# Patient Record
Sex: Female | Born: 1970
Health system: Southern US, Community
[De-identification: ages and names within clinical notes are randomized; demographics above are authoritative.]

## PROBLEM LIST (undated history)

## (undated) DIAGNOSIS — F32A Depression, unspecified: Secondary | ICD-10-CM

## (undated) DIAGNOSIS — F329 Major depressive disorder, single episode, unspecified: Secondary | ICD-10-CM

## (undated) DIAGNOSIS — R0981 Nasal congestion: Secondary | ICD-10-CM

## (undated) DIAGNOSIS — F419 Anxiety disorder, unspecified: Secondary | ICD-10-CM

## (undated) DIAGNOSIS — F319 Bipolar disorder, unspecified: Secondary | ICD-10-CM

## (undated) DIAGNOSIS — F1910 Other psychoactive substance abuse, uncomplicated: Secondary | ICD-10-CM

## (undated) DIAGNOSIS — K5900 Constipation, unspecified: Secondary | ICD-10-CM

## (undated) DIAGNOSIS — K2970 Gastritis, unspecified, without bleeding: Secondary | ICD-10-CM

## (undated) DIAGNOSIS — K279 Peptic ulcer, site unspecified, unspecified as acute or chronic, without hemorrhage or perforation: Secondary | ICD-10-CM

## (undated) DIAGNOSIS — R109 Unspecified abdominal pain: Secondary | ICD-10-CM

## (undated) DIAGNOSIS — E039 Hypothyroidism, unspecified: Secondary | ICD-10-CM

## (undated) DIAGNOSIS — D649 Anemia, unspecified: Secondary | ICD-10-CM

## (undated) DIAGNOSIS — G8929 Other chronic pain: Secondary | ICD-10-CM

## (undated) DIAGNOSIS — R51 Headache: Secondary | ICD-10-CM

## (undated) HISTORY — PX: MULTIPLE TOOTH EXTRACTIONS: SHX2053

## (undated) HISTORY — PX: HERNIA REPAIR: SHX51

## (undated) HISTORY — DX: Bipolar disorder, unspecified: F31.9

## (undated) HISTORY — DX: Nasal congestion: R09.81

## (undated) HISTORY — DX: Headache: R51

## (undated) HISTORY — DX: Peptic ulcer, site unspecified, unspecified as acute or chronic, without hemorrhage or perforation: K27.9

---

## 1998-03-27 ENCOUNTER — Encounter (HOSPITAL_COMMUNITY): Admission: RE | Admit: 1998-03-27 | Discharge: 1998-06-25 | Payer: Self-pay | Admitting: Psychiatry

## 2007-02-01 ENCOUNTER — Encounter: Admission: RE | Admit: 2007-02-01 | Discharge: 2007-02-01 | Payer: Self-pay | Admitting: Neurosurgery

## 2007-05-03 ENCOUNTER — Emergency Department (HOSPITAL_COMMUNITY): Admission: EM | Admit: 2007-05-03 | Discharge: 2007-05-03 | Payer: Self-pay | Admitting: Emergency Medicine

## 2007-09-05 ENCOUNTER — Ambulatory Visit: Payer: Self-pay | Admitting: Psychiatry

## 2007-09-05 ENCOUNTER — Emergency Department (HOSPITAL_COMMUNITY): Admission: EM | Admit: 2007-09-05 | Discharge: 2007-09-05 | Payer: Self-pay | Admitting: *Deleted

## 2007-09-05 ENCOUNTER — Inpatient Hospital Stay (HOSPITAL_COMMUNITY): Admission: RE | Admit: 2007-09-05 | Discharge: 2007-09-13 | Payer: Self-pay | Admitting: Psychiatry

## 2007-10-23 ENCOUNTER — Inpatient Hospital Stay: Payer: Self-pay | Admitting: Unknown Physician Specialty

## 2010-01-05 ENCOUNTER — Emergency Department (HOSPITAL_COMMUNITY): Admission: EM | Admit: 2010-01-05 | Discharge: 2010-01-05 | Payer: Self-pay | Admitting: Emergency Medicine

## 2010-01-06 ENCOUNTER — Ambulatory Visit (HOSPITAL_COMMUNITY): Admission: RE | Admit: 2010-01-06 | Discharge: 2010-01-06 | Payer: Self-pay | Admitting: Emergency Medicine

## 2010-04-15 ENCOUNTER — Ambulatory Visit (HOSPITAL_COMMUNITY): Payer: Self-pay | Admitting: Psychiatry

## 2010-04-29 ENCOUNTER — Ambulatory Visit (HOSPITAL_COMMUNITY): Payer: Self-pay | Admitting: Psychiatry

## 2010-05-10 ENCOUNTER — Ambulatory Visit (HOSPITAL_COMMUNITY): Payer: Self-pay | Admitting: Psychiatry

## 2010-05-21 ENCOUNTER — Ambulatory Visit (HOSPITAL_COMMUNITY): Payer: Self-pay | Admitting: Psychiatry

## 2010-05-27 ENCOUNTER — Ambulatory Visit (HOSPITAL_COMMUNITY): Payer: Self-pay | Admitting: Psychiatry

## 2010-07-08 ENCOUNTER — Ambulatory Visit (HOSPITAL_COMMUNITY): Payer: Self-pay | Admitting: Psychiatry

## 2010-08-17 ENCOUNTER — Ambulatory Visit (HOSPITAL_COMMUNITY): Payer: Self-pay | Admitting: Psychiatry

## 2010-11-18 ENCOUNTER — Ambulatory Visit (HOSPITAL_COMMUNITY)
Admission: RE | Admit: 2010-11-18 | Discharge: 2010-11-18 | Payer: Self-pay | Source: Home / Self Care | Attending: Psychiatry | Admitting: Psychiatry

## 2010-11-21 ENCOUNTER — Encounter: Payer: Self-pay | Admitting: Neurosurgery

## 2011-01-13 ENCOUNTER — Encounter (HOSPITAL_COMMUNITY): Payer: Self-pay | Admitting: Psychiatry

## 2011-01-23 LAB — COMPREHENSIVE METABOLIC PANEL
ALT: 11 U/L (ref 0–35)
AST: 18 U/L (ref 0–37)
Albumin: 4.3 g/dL (ref 3.5–5.2)
BUN: 14 mg/dL (ref 6–23)
Calcium: 10 mg/dL (ref 8.4–10.5)
Chloride: 101 mEq/L (ref 96–112)
GFR calc non Af Amer: >60 mL/min (ref >60)
Total Protein: 7.5 g/dL (ref 6.0–8.3)

## 2011-01-23 LAB — URINALYSIS, ROUTINE W REFLEX MICROSCOPIC
Glucose, UA: NEGATIVE mg/dL
Ketones, ur: 15 mg/dL — AB
Leukocytes, UA: NEGATIVE
Specific Gravity, Urine: >1.03 — ABNORMAL HIGH (ref 1.005–1.030)
pH: 5 (ref 5.0–8.0)

## 2011-01-23 LAB — POCT CARDIAC MARKERS: CKMB, poc: <1 ng/mL — ABNORMAL LOW (ref 1.0–8.0)

## 2011-01-23 LAB — CBC
Hemoglobin: 13.9 g/dL (ref 12.0–15.0)
MCHC: 34.8 g/dL (ref 30.0–36.0)
RBC: 4.73 MIL/uL (ref 3.87–5.11)
RDW: 16.1 % — ABNORMAL HIGH (ref 11.5–15.5)

## 2011-01-23 LAB — URINE MICROSCOPIC-ADD ON

## 2011-01-23 LAB — DIFFERENTIAL
Basophils Absolute: 0.1 10*3/uL (ref 0.0–0.1)
Eosinophils Relative: 2 % (ref 0–5)
Lymphocytes Relative: 35 % (ref 12–46)
Lymphs Abs: 3.6 10*3/uL (ref 0.7–4.0)
Neutrophils Relative %: 56 % (ref 43–77)

## 2011-01-23 LAB — D-DIMER, QUANTITATIVE: D-Dimer, Quant: 0.3 ug/mL-FEU (ref 0.00–0.48)

## 2011-01-23 LAB — LIPASE, BLOOD: Lipase: 16 U/L (ref 11–59)

## 2011-01-27 ENCOUNTER — Encounter (INDEPENDENT_AMBULATORY_CARE_PROVIDER_SITE_OTHER): Payer: PRIVATE HEALTH INSURANCE | Admitting: Psychiatry

## 2011-01-27 DIAGNOSIS — F3189 Other bipolar disorder: Secondary | ICD-10-CM

## 2011-03-15 NOTE — H&P (Signed)
NAME:  Deanna Berry, Deanna Berry NO.:  [id number]   MEDICAL RECORD NO.:  [id number]          PATIENT TYPE:  IPS   LOCATION:  0507                          FACILITY:  [facility]   PHYSICIAN:  Geoffery Lyons, M.D.      DATE OF BIRTH:  [birth year]   DATE OF ADMISSION:  09/05/2007  DATE OF DISCHARGE:                       PSYCHIATRIC ADMISSION ASSESSMENT   TIME SEEN:  At 8:40 a.m.   IDENTIFYING INFORMATION:  A 40 year old white female who is married.  This is a voluntary admission.   HISTORY OF PRESENT ILLNESS:  This patient was referred by the emergency  room where she presented complaining of suicidal thoughts without a  clear plan, having a lot of difficulty with drug addiction.  Was  tearful.  Said that she had been using large amounts of opiates and  wanted help getting off of them.  She reports no IV drug use.  Has been  using Percocet and methadone and other types of opiates, typically  crushing and snorting them, with her last use on November 4, when she  snorted about 20 mg of Percocet and 20 mg of methadone.  Also, has been  snorting cocaine powder intermittently, usually about twice a week,  using marijuana and also drinking alcohol.  Is able to talk.  She is  vague about her alcohol use pattern, but can tolerate drinking a fifth  of Christiane Ha without any ill effects.  She reports that she last had  a year completely abstinent from all substances about three years ago  after she completed treatment at the [facility] center [facility] in [place],  [place].  Did very well during that year when she was abstinent.  Gradually relapsed after that and has resumed drug use.  She denies any  homicidal thought.  Admits to passive suicidal thoughts with no active  plan to harm herself, requesting help getting off of drugs.  Denies any  hallucinations.   PAST PSYCHIATRIC HISTORY:  The patient has been followed in the past by  mental health, Dr. __________  in [facility].  She  has been prescribed Topamax as a mood stabilizer, Cymbalta as an  antidepressant and Klonopin for her anxiety, but is not taking any  medications regularly.  First admission to [facility].   SOCIAL HISTORY:  She is married, lives with her husband and sons - age  19, 43 and 68 years old.  Currently working full-time as a Tree surgeon,  and cites some chronic marital conflict as a stressor.  She reports her  husband is supportive of her attempting to become abstinent.   FAMILY HISTORY:  The patient was an only child.  Denies any family  history of mental illness or substance abuse.  Alcohol and drug history  noted above.  In addition, she does smoke about one and one half packs  per day of tobacco.   MEDICAL HISTORY:   PRIMARY CARE Grover Robinson:  No regular primary care Latrease Kunde.   MEDICAL PROBLEMS:  She reports congestion and a lot of nasal and sinus  problems due  to snorting multiple pills, but has no specific diagnosis.   MEDICATIONS:  Not taking any medications regularly.   DRUG ALLERGIES:  NONE.   POSITIVE PHYSICAL FINDINGS:  GENERAL:  Full physical exam was done in  the emergency room.  It is noted in the record.  This is a petite built  female with a markedly nasal quality to her voice, about 5 feet tall, 90  pounds, temperature 98.3, pulse 55, respirations 16, blood pressure  111/83 in the emergency room.  Her urine drug screen was positive for  marijuana, cocaine, opiates and benzodiazepines.  Alcohol level less  than 5.  Chemistry unremarkable, within normal limits.  Urine pregnancy  test negative.  Her CBC - WBC 7.8, hemoglobin 11.0, hematocrit 33.4,  platelets 288,000 and MCV 82.7.   MENTAL STATUS EXAM:  AXIS I:  Fully alert female, cooperative, initially  complaining of feeling miserable with some muscle cramping.  Speech has  a markedly nasal resonance, somewhat decreased in production, but pace  is normal.  Form is somewhat altered  by her nasal resonance.  Mood is  depressed and irritable.  Thought process is remarkable for vague  passive suicidal thoughts.  No specific plan.  No delusional statements.  No signs of psychosis.  Does not appear to be internally distracted.  No  homicidality.  She does talk about her stressors with relapsing, having  financial stressors and having difficulty staying off the drugs, using  large amounts.  This is stressing them financially and they are at risk  of losing their home.  Cognition is intact to person, place, situation.  Insight is adequate.  Impulse control and concentration are impaired.  Depressive disorder NOS.  EtOH abuse.  Rule out dependence.  Opiate  abuse and dependence and polysubstance abuse.  AXIS II:  Deferred.  AXIS III:  No diagnosis.  AXIS IV:  Severe issues with chronic marital conflict and economic  problems with financial stressors.  AXIS V:  Current 30, past year not known.   PLAN:  To voluntarily admit the patient to our dual diagnosis program.  We started her on both the clonidine and a Librium protocol, and we will  get some basic labs, hepatic function panel, routine U/A and a TSH, and  estimated length of stay is five days.      Margaret A. Scott, N.P.      Geoffery Lyons, M.D.  Electronically Signed    MAS/MEDQ  D:  09/06/2007  T:  09/07/2007  Job:  [id number]

## 2011-03-18 NOTE — Discharge Summary (Signed)
NAME:  Deanna Berry, Deanna Berry NO.:  [id number]   MEDICAL RECORD NO.:  [id number]          PATIENT TYPE:  IPS   LOCATION:  0508                          FACILITY:  [facility]   PHYSICIAN:  Geoffery Lyons, M.D.      DATE OF BIRTH:  04-14-1971   DATE OF ADMISSION:  09/05/2007  DATE OF DISCHARGE:  09/13/2007                               DISCHARGE SUMMARY   CHIEF COMPLAINT:  This was the first admission to [facility] for this 40 year old white female voluntarily admitted.  Referred  by the emergency room where she presented complaining of suicidal  thoughts without a clear plan, having a lot of difficulty with drug  addiction, tearful, had been using large amount of opiates, wanted help  getting off them.  Percocet and methadone, crushing and snorting them.  Last used September 04, 2007 when she snorted about 20 mg of Percocet, 20  mg of methadone, also snorting cocaine powder, using marijuana, drinking  alcohol.  She can tolerate drinking a fifth of Christiane Ha without any  ill effects.  A year completely abstinent from all substances 3 years  prior to this admission.  After she completed treatment in  [place], a relapse.   PAST MEDICAL HISTORY:  Being followed by the [facility].  Had been prescribed Topamax as a mood stabilizer,  Cymbalta and Klonopin, but does not take medication regularly.  First  time at [facility].  Past medical history as already stated,  persistent use of opiates and cocaine as well as alcohol.   MEDICAL HISTORY:  Noncontributory.   MEDICATIONS:  None.   PHYSICAL EXAMINATION:  Physical examination performed and compatible  with orifice in the palate area which produces the nasal tone.  GENERAL:  Exam revealed alert cooperative female initially complained of  feeling miserable with some muscle cramping.  Speech was nasal.  It was  markedly nasal resonance, decreasing production.  Mood is  depressed,  irritable.  Thought processes are clear, rational and goal oriented.  Affect depressed, irritable.  Thought processes logical, coherent and  relevant.  Dealing with the multiple stressors, fear of relapsing.  Denies financial difficulty, risk of losing her home.  No active  homicidal or suicidal ideas.  No  hallucinations.  Cognition well  preserved.   LABORATORY WORK:  SGOT 16, SGPT 8, total bilirubin 0.4, TSH 4.87.   ADMISSION DIAGNOSES:  AXIS I:  Opiate dependence, cocaine, marijuana,  benzodiazepine abuse, depressed disorder, not otherwise specified.  AXIS II:  No diagnosis.  AXIS III:  No diagnosis.  AXIS IV:  Moderate.  AXIS V:  On admission 30, in the last year 65-70.   COURSE IN THE HOSPITAL:  She was admitted, started individual and group  psychotherapy.  She was detoxified with Librium and clonidine.  She was  given some Symmetrel as well as Seroquel.  She was given trazodone for  sleep.  Eventually she was started on Wellbutrin.  As already stated,  endorsed depression, decreased self-confidence, anxiety, severe mood  swings, easily triggered by little things.  Has had thoughts of killing  herself.  Husband she claims was getting clean at the same time.  Alcohol did go up to a fifth, opiates, Percocet, multiple other opiates,  snorting, increased use of cocaine as well as marijuana.  Seen Dr.  __________ at mental health.  Was in [place] 3 years ago, 1 year  abstinence, 3 children, lives with the husband.  By September 07, 2007,  endorsed that she continued to have a hard time and multiple symptoms of  withdrawal.  No specific malaise, discomfort, jerky feedings.  Upset  about the orifice she has in her palate secondary to snorting toxic  effect on the tissue providing a very nasal speech.  Wants help with the  mood swings.  No significant sobriety other than 1 year when she was  abstinent.  She claimed her mood was okay.  Dealing with shame and  guilt.  Worried  about the husband.  She was pretty loud volume through  the first part of the conversation.  __________.   September 10, 2007, she was endorsing difficulty with her mood, still  dealing with the lack of energy, lack of motivation.  Claimed she was  willing to do whatever she needed to do to get herself together.  Did  use Seroquel successfully and so we continued to work with the Seroquel  optimizing the dose.  September 11, 2007, she was anxious, tearful,  agitated, could not soothe herself, having a very hard time.  __________  the group with an acute panic attack, fearful, claimed conflict with the  mother.  She is the one she was going to stay with.  Husband __________  for detox, but she was not sure how committed he is going to be to his  sobriety.  When goes  to bed, she endorsed she started thinking,  worrying, unable to stop herself from doing this.  Waking up in the  middle of the night, worrying spouse leaving before she is ready to go.  We increased the Seroquel up to 300.  She has used high doses before.  Objectively, she was less anxious.   There was a family session with the mother.  Each one spoke of  challenges in the relationship, but they ended up being pretty  supportive of his each other.  September 13, 2007, she was in full  contact with reality.  There were no active suicidal ideas, no  hallucinations or delusions.  No withdrawal.  She was going to pursue  further outpatient treatment.  Husband was going to be [facility] from  [facility]  Not sure if he was going to follow up to rehab, but she was  clear that she was not going to allow her husband to affect her own  recall __________.   DISCHARGE DIAGNOSES:  AXIS I:  Opiates, cocaine, marijuana and alcohol  abuse, rule out dependence, mood disorder, not otherwise specified.  AXIS II:  No diagnosis.  AXIS III:  No diagnosis.  AXIS IV:  Moderate.  AXIS V:  On discharge 55-60.   DISCHARGE MEDICATIONS:  1.  Symmetrel 100 twice a day.  2. Wellbutrin XL 150 in the morning.  3. Seroquel 50 three times a day and 300 at bedtime.  4. __________ 50 twice a day as needed.  5. Trazodone 150 at bedtime as needed for sleep.   FOLLOW UP:  Follow up by [facility].      Geoffery Lyons, M.D.  Electronically Signed  IL/MEDQ  D:  09/28/2007  T:  09/28/2007  Job:  [id number]

## 2011-03-24 ENCOUNTER — Encounter (INDEPENDENT_AMBULATORY_CARE_PROVIDER_SITE_OTHER): Payer: PRIVATE HEALTH INSURANCE | Admitting: Psychiatry

## 2011-03-24 ENCOUNTER — Encounter (HOSPITAL_COMMUNITY): Payer: PRIVATE HEALTH INSURANCE | Admitting: Psychiatry

## 2011-03-24 DIAGNOSIS — F3189 Other bipolar disorder: Secondary | ICD-10-CM

## 2011-04-14 ENCOUNTER — Encounter (HOSPITAL_COMMUNITY): Payer: PRIVATE HEALTH INSURANCE | Admitting: Psychiatry

## 2011-04-22 ENCOUNTER — Encounter (INDEPENDENT_AMBULATORY_CARE_PROVIDER_SITE_OTHER): Payer: PRIVATE HEALTH INSURANCE | Admitting: Psychiatry

## 2011-04-22 DIAGNOSIS — F39 Unspecified mood [affective] disorder: Secondary | ICD-10-CM

## 2011-05-02 ENCOUNTER — Encounter (HOSPITAL_COMMUNITY): Payer: PRIVATE HEALTH INSURANCE | Admitting: Psychiatry

## 2011-05-03 ENCOUNTER — Encounter (INDEPENDENT_AMBULATORY_CARE_PROVIDER_SITE_OTHER): Payer: PRIVATE HEALTH INSURANCE | Admitting: Psychiatry

## 2011-05-03 DIAGNOSIS — F39 Unspecified mood [affective] disorder: Secondary | ICD-10-CM

## 2011-05-19 ENCOUNTER — Encounter (HOSPITAL_COMMUNITY): Payer: PRIVATE HEALTH INSURANCE | Admitting: Psychiatry

## 2011-05-19 ENCOUNTER — Encounter (INDEPENDENT_AMBULATORY_CARE_PROVIDER_SITE_OTHER): Payer: PRIVATE HEALTH INSURANCE | Admitting: Psychiatry

## 2011-05-19 DIAGNOSIS — F3189 Other bipolar disorder: Secondary | ICD-10-CM

## 2011-06-16 ENCOUNTER — Encounter (INDEPENDENT_AMBULATORY_CARE_PROVIDER_SITE_OTHER): Payer: PRIVATE HEALTH INSURANCE | Admitting: Psychiatry

## 2011-06-16 DIAGNOSIS — F3189 Other bipolar disorder: Secondary | ICD-10-CM

## 2011-07-21 ENCOUNTER — Encounter (INDEPENDENT_AMBULATORY_CARE_PROVIDER_SITE_OTHER): Payer: PRIVATE HEALTH INSURANCE | Admitting: Psychiatry

## 2011-07-21 DIAGNOSIS — F3189 Other bipolar disorder: Secondary | ICD-10-CM

## 2011-07-22 ENCOUNTER — Encounter (INDEPENDENT_AMBULATORY_CARE_PROVIDER_SITE_OTHER): Payer: PRIVATE HEALTH INSURANCE | Admitting: Psychiatry

## 2011-07-22 DIAGNOSIS — F39 Unspecified mood [affective] disorder: Secondary | ICD-10-CM

## 2011-08-09 LAB — DIFFERENTIAL
Basophils Absolute: 0.1
Basophils Relative: 1
Eosinophils Relative: 4
Lymphocytes Relative: 46
Lymphs Abs: 3.6 — ABNORMAL HIGH
Monocytes Absolute: 0.6

## 2011-08-09 LAB — BASIC METABOLIC PANEL
BUN: 15
CO2: 26
Calcium: 9.4
Creatinine, Ser: 0.98
GFR calc Af Amer: >60

## 2011-08-09 LAB — RAPID URINE DRUG SCREEN, HOSP PERFORMED
Barbiturates: NOT DETECTED
Benzodiazepines: POSITIVE — AB
Cocaine: POSITIVE — AB

## 2011-08-09 LAB — HEPATIC FUNCTION PANEL
Albumin: 3.3 — ABNORMAL LOW
Albumin: 3.7
Alkaline Phosphatase: 47
Indirect Bilirubin: 0.4
Total Bilirubin: 0.4
Total Protein: 6.7

## 2011-08-09 LAB — TSH
TSH: 0.929
TSH: 4.887

## 2011-08-09 LAB — PREGNANCY, URINE: Preg Test, Ur: NEGATIVE

## 2011-08-09 LAB — CBC
HCT: 33.4 — ABNORMAL LOW
Hemoglobin: 11 — ABNORMAL LOW
RBC: 4.04

## 2011-08-09 LAB — ETHANOL: Alcohol, Ethyl (B): <5

## 2011-08-16 LAB — DIFFERENTIAL
Eosinophils Relative: 6 — ABNORMAL HIGH
Lymphocytes Relative: 27
Lymphs Abs: 2.8
Monocytes Absolute: 0.6
Monocytes Relative: 6

## 2011-08-16 LAB — BASIC METABOLIC PANEL
Chloride: 106
GFR calc Af Amer: >60
GFR calc non Af Amer: >60
Potassium: 3.9
Sodium: 138

## 2011-08-16 LAB — CBC
HCT: 33.2 — ABNORMAL LOW
Hemoglobin: 11 — ABNORMAL LOW
MCV: 82.6
Platelets: 248
RBC: 4.02
WBC: 10.5

## 2011-08-23 ENCOUNTER — Encounter (INDEPENDENT_AMBULATORY_CARE_PROVIDER_SITE_OTHER): Payer: PRIVATE HEALTH INSURANCE | Admitting: Psychiatry

## 2011-08-23 DIAGNOSIS — F3189 Other bipolar disorder: Secondary | ICD-10-CM

## 2011-09-07 ENCOUNTER — Other Ambulatory Visit (HOSPITAL_COMMUNITY): Payer: Self-pay | Admitting: *Deleted

## 2011-09-08 MED ORDER — QUETIAPINE FUMARATE 400 MG PO TABS: 400.0000 mg | ORAL_TABLET | Freq: Every day | ORAL | Status: DC

## 2011-09-08 MED ORDER — HYDROXYZINE PAMOATE 100 MG PO CAPS: 100.0000 mg | ORAL_CAPSULE | Freq: Two times a day (BID) | ORAL | Status: AC

## 2011-09-08 NOTE — Telephone Encounter (Signed)
Approved by Dr. Lolly Mustache.

## 2011-09-13 ENCOUNTER — Encounter (HOSPITAL_COMMUNITY): Payer: PRIVATE HEALTH INSURANCE | Admitting: Psychiatry

## 2011-09-13 ENCOUNTER — Ambulatory Visit (INDEPENDENT_AMBULATORY_CARE_PROVIDER_SITE_OTHER): Payer: PRIVATE HEALTH INSURANCE | Admitting: Psychiatry

## 2011-09-13 ENCOUNTER — Encounter (HOSPITAL_COMMUNITY): Payer: Self-pay | Admitting: Psychiatry

## 2011-09-13 VITALS — Wt 122.0 lb

## 2011-09-13 DIAGNOSIS — F319 Bipolar disorder, unspecified: Secondary | ICD-10-CM

## 2011-09-13 MED ORDER — DIVALPROEX SODIUM ER 250 MG PO TB24: 250.0000 mg | ORAL_TABLET | Freq: Three times a day (TID) | ORAL | Status: DC

## 2011-09-13 NOTE — Progress Notes (Signed)
Patient came today for her followup appointment she is not taking Depakote 750 at bedtime. The shakes and tremors has been much where her she's also noticed less irritable and less angry. She still endorsed sometime anxiety but overall her mood has been much improved. She is sleeping 6-7 hour without any difficulty. Sometimes she feels that Vistaril is not helping and wondering if he can give her Klonopin or Valium however I have explained that these are controlled substance and I like to continue Vistaril for now to control these residual anxiety symptoms. She denies any aggression crying spells or severe panic attack. She still has issues with her husband. Overall she is tolerating her medication and reported no new side effects. Her weight is also under control and she is more careful watching her diet in recent weeks. She is not doing any drugs or alcohol.  Mental status examination patient is anxious but cooperative she maintained fair eye contact. She remains at times emotional but overall she is relevant in the conversation. Her speech is nasal and at times difficult to understand but her volume of speech is within normal limits. She denies any active or passive suicidal thinking homicidal thinking. There were no psychotic symptoms. She described her mood is anxious and her affect was mood congruent. She is alert and oriented x3 her insight judgment and impulse control is fair.   Assessment. Bipolar disorder, polysubstance abuse versus dependence in remission.  Plan I will continue Depakote 750 mg at bedtime along with Seroquel 400 mg at bedtime and Vistaril 100 mg twice a day. I have explained the risks and benefits of medication in detail I will also get Depakote level along with CBC and CMP. I recommended to call us if she has any question or concern about the medication otherwise I will see her in 6 weeks.

## 2011-10-10 ENCOUNTER — Other Ambulatory Visit (HOSPITAL_COMMUNITY): Payer: Self-pay | Admitting: Psychiatry

## 2011-10-11 ENCOUNTER — Other Ambulatory Visit (HOSPITAL_COMMUNITY): Payer: Self-pay | Admitting: Psychiatry

## 2011-10-12 LAB — COMPLETE METABOLIC PANEL WITH GFR
BUN: 11 mg/dL (ref 6–23)
CO2: 29 mEq/L (ref 19–32)
GFR, Est African American: 81 mL/min
GFR, Est Non African American: 71 mL/min
Glucose, Bld: 88 mg/dL (ref 70–99)
Sodium: 142 mEq/L (ref 135–145)
Total Bilirubin: 0.2 mg/dL — ABNORMAL LOW (ref 0.3–1.2)
Total Protein: 6.9 g/dL (ref 6.0–8.3)

## 2011-10-12 LAB — CBC
Hemoglobin: 12.7 g/dL (ref 12.0–15.0)
MCHC: 31.3 g/dL (ref 30.0–36.0)
RDW: 15.6 % — ABNORMAL HIGH (ref 11.5–15.5)
WBC: 11.1 10*3/uL — ABNORMAL HIGH (ref 4.0–10.5)

## 2011-10-12 LAB — VALPROIC ACID LEVEL: Valproic Acid Lvl: 69.2 ug/mL (ref 50.0–100.0)

## 2011-10-13 ENCOUNTER — Ambulatory Visit (INDEPENDENT_AMBULATORY_CARE_PROVIDER_SITE_OTHER): Payer: PRIVATE HEALTH INSURANCE | Admitting: Psychiatry

## 2011-10-13 DIAGNOSIS — F319 Bipolar disorder, unspecified: Secondary | ICD-10-CM

## 2011-10-13 MED ORDER — DIVALPROEX SODIUM ER 250 MG PO TB24: 250.0000 mg | ORAL_TABLET | Freq: Three times a day (TID) | ORAL | Status: DC

## 2011-10-13 MED ORDER — HYDROXYZINE PAMOATE 100 MG PO CAPS: 100.0000 mg | ORAL_CAPSULE | Freq: Three times a day (TID) | ORAL | Status: DC | PRN

## 2011-10-13 MED ORDER — QUETIAPINE FUMARATE 400 MG PO TABS: 400.0000 mg | ORAL_TABLET | Freq: Every day | ORAL | Status: DC
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
## 2011-11-01 HISTORY — PX: CLEFT PALATE REPAIR: SUR1165

## 2011-11-24 ENCOUNTER — Encounter (HOSPITAL_COMMUNITY): Payer: Self-pay | Admitting: Psychiatry

## 2011-11-24 ENCOUNTER — Ambulatory Visit (INDEPENDENT_AMBULATORY_CARE_PROVIDER_SITE_OTHER): Payer: PRIVATE HEALTH INSURANCE | Admitting: Psychiatry

## 2011-11-24 VITALS — Wt 119.0 lb

## 2011-11-24 DIAGNOSIS — F319 Bipolar disorder, unspecified: Secondary | ICD-10-CM

## 2011-11-24 MED ORDER — HYDROXYZINE PAMOATE 100 MG PO CAPS: 100.0000 mg | ORAL_CAPSULE | Freq: Three times a day (TID) | ORAL | Status: DC | PRN

## 2011-11-24 MED ORDER — QUETIAPINE FUMARATE 400 MG PO TABS: 400.0000 mg | ORAL_TABLET | Freq: Every day | ORAL | Status: DC

## 2011-11-24 MED ORDER — DIVALPROEX SODIUM ER 500 MG PO TB24: 500.0000 mg | ORAL_TABLET | Freq: Two times a day (BID) | ORAL | Status: DC

## 2011-11-24 NOTE — Patient Instructions (Signed)
Schedule appointment with therapist
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
## 2011-11-29 ENCOUNTER — Ambulatory Visit (INDEPENDENT_AMBULATORY_CARE_PROVIDER_SITE_OTHER): Payer: PRIVATE HEALTH INSURANCE | Admitting: Psychiatry

## 2011-11-29 ENCOUNTER — Encounter (HOSPITAL_COMMUNITY): Payer: Self-pay | Admitting: Psychiatry

## 2011-11-29 DIAGNOSIS — F319 Bipolar disorder, unspecified: Secondary | ICD-10-CM

## 2011-11-29 NOTE — Patient Instructions (Signed)
Discussed orally - Patient is provided with number for [facility]  [phone]
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
## 2011-12-22 ENCOUNTER — Ambulatory Visit (INDEPENDENT_AMBULATORY_CARE_PROVIDER_SITE_OTHER): Payer: PRIVATE HEALTH INSURANCE | Admitting: Psychiatry

## 2011-12-22 ENCOUNTER — Encounter (HOSPITAL_COMMUNITY): Payer: Self-pay | Admitting: Psychiatry

## 2011-12-22 VITALS — Wt 115.0 lb

## 2011-12-22 DIAGNOSIS — F319 Bipolar disorder, unspecified: Secondary | ICD-10-CM

## 2011-12-22 MED ORDER — QUETIAPINE FUMARATE 400 MG PO TABS: 400.0000 mg | ORAL_TABLET | Freq: Every day | ORAL | Status: DC

## 2011-12-22 MED ORDER — DIVALPROEX SODIUM ER 500 MG PO TB24: 500.0000 mg | ORAL_TABLET | Freq: Two times a day (BID) | ORAL | Status: DC

## 2011-12-22 MED ORDER — HYDROXYZINE PAMOATE 100 MG PO CAPS: 100.0000 mg | ORAL_CAPSULE | Freq: Three times a day (TID) | ORAL | Status: DC | PRN
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
## 2011-12-23 LAB — VALPROIC ACID LEVEL: Valproic Acid Lvl: 87 ug/mL (ref 50.0–100.0)

## 2012-02-21 ENCOUNTER — Ambulatory Visit (HOSPITAL_COMMUNITY): Payer: PRIVATE HEALTH INSURANCE | Admitting: Psychiatry

## 2012-05-21 ENCOUNTER — Other Ambulatory Visit (HOSPITAL_COMMUNITY): Payer: Self-pay | Admitting: *Deleted

## 2012-05-21 DIAGNOSIS — F319 Bipolar disorder, unspecified: Secondary | ICD-10-CM

## 2012-05-22 ENCOUNTER — Other Ambulatory Visit (HOSPITAL_COMMUNITY): Payer: Self-pay | Admitting: Psychiatry

## 2012-05-22 DIAGNOSIS — F319 Bipolar disorder, unspecified: Secondary | ICD-10-CM

## 2012-05-22 MED ORDER — DIVALPROEX SODIUM ER 500 MG PO TB24: 500.0000 mg | ORAL_TABLET | Freq: Two times a day (BID) | ORAL | Status: DC

## 2012-05-31 ENCOUNTER — Ambulatory Visit (INDEPENDENT_AMBULATORY_CARE_PROVIDER_SITE_OTHER): Payer: PRIVATE HEALTH INSURANCE | Admitting: Psychiatry

## 2012-05-31 ENCOUNTER — Encounter (HOSPITAL_COMMUNITY): Payer: Self-pay | Admitting: Psychiatry

## 2012-05-31 VITALS — Wt 105.0 lb

## 2012-05-31 DIAGNOSIS — F319 Bipolar disorder, unspecified: Secondary | ICD-10-CM

## 2012-05-31 DIAGNOSIS — F3160 Bipolar disorder, current episode mixed, unspecified: Secondary | ICD-10-CM

## 2012-05-31 DIAGNOSIS — F1910 Other psychoactive substance abuse, uncomplicated: Secondary | ICD-10-CM

## 2012-05-31 DIAGNOSIS — F1994 Other psychoactive substance use, unspecified with psychoactive substance-induced mood disorder: Secondary | ICD-10-CM

## 2012-05-31 MED ORDER — HYDROXYZINE PAMOATE 100 MG PO CAPS: 100.0000 mg | ORAL_CAPSULE | Freq: Three times a day (TID) | ORAL | Status: DC | PRN

## 2012-05-31 MED ORDER — GABAPENTIN 400 MG PO CAPS: 400.0000 mg | ORAL_CAPSULE | Freq: Every day | ORAL | Status: DC
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
## 2012-06-01 ENCOUNTER — Other Ambulatory Visit (HOSPITAL_COMMUNITY): Payer: Self-pay | Admitting: Psychiatry

## 2012-06-03 ENCOUNTER — Other Ambulatory Visit (HOSPITAL_COMMUNITY): Payer: Self-pay | Admitting: Psychiatry

## 2012-06-19 ENCOUNTER — Other Ambulatory Visit (HOSPITAL_COMMUNITY): Payer: Self-pay | Admitting: Psychiatry

## 2012-06-19 DIAGNOSIS — F319 Bipolar disorder, unspecified: Secondary | ICD-10-CM

## 2012-06-20 ENCOUNTER — Other Ambulatory Visit (HOSPITAL_COMMUNITY): Payer: Self-pay | Admitting: *Deleted

## 2012-06-20 ENCOUNTER — Other Ambulatory Visit (HOSPITAL_COMMUNITY): Payer: Self-pay | Admitting: Psychiatry

## 2012-06-20 DIAGNOSIS — F319 Bipolar disorder, unspecified: Secondary | ICD-10-CM

## 2012-06-20 MED ORDER — QUETIAPINE FUMARATE 400 MG PO TABS: 400.0000 mg | ORAL_TABLET | Freq: Every day | ORAL | Status: DC

## 2012-06-21 ENCOUNTER — Encounter (HOSPITAL_COMMUNITY): Payer: Self-pay | Admitting: Psychiatry

## 2012-06-21 ENCOUNTER — Ambulatory Visit (HOSPITAL_COMMUNITY): Payer: Self-pay | Admitting: Psychiatry

## 2012-06-21 ENCOUNTER — Ambulatory Visit (INDEPENDENT_AMBULATORY_CARE_PROVIDER_SITE_OTHER): Payer: PRIVATE HEALTH INSURANCE | Admitting: Psychiatry

## 2012-06-21 VITALS — Wt 103.0 lb

## 2012-06-21 DIAGNOSIS — F3160 Bipolar disorder, current episode mixed, unspecified: Secondary | ICD-10-CM

## 2012-06-21 DIAGNOSIS — F1994 Other psychoactive substance use, unspecified with psychoactive substance-induced mood disorder: Secondary | ICD-10-CM

## 2012-06-21 DIAGNOSIS — F319 Bipolar disorder, unspecified: Secondary | ICD-10-CM

## 2012-06-21 MED ORDER — GABAPENTIN 400 MG PO CAPS: 400.0000 mg | ORAL_CAPSULE | Freq: Every day | ORAL | Status: DC

## 2012-06-21 MED ORDER — HYDROXYZINE PAMOATE 100 MG PO CAPS: 100.0000 mg | ORAL_CAPSULE | Freq: Three times a day (TID) | ORAL | Status: DC | PRN

## 2012-06-21 MED ORDER — DIVALPROEX SODIUM ER 500 MG PO TB24: 500.0000 mg | ORAL_TABLET | Freq: Every day | ORAL | Status: DC
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
## 2012-07-12 ENCOUNTER — Ambulatory Visit (HOSPITAL_COMMUNITY): Payer: Self-pay | Admitting: Psychiatry

## 2012-12-11 ENCOUNTER — Encounter (HOSPITAL_BASED_OUTPATIENT_CLINIC_OR_DEPARTMENT_OTHER): Payer: Self-pay

## 2012-12-11 ENCOUNTER — Emergency Department (HOSPITAL_BASED_OUTPATIENT_CLINIC_OR_DEPARTMENT_OTHER)
Admission: EM | Admit: 2012-12-11 | Discharge: 2012-12-11 | Disposition: A | Discharge status: Other Acute Inpatient Hospital | Payer: PRIVATE HEALTH INSURANCE | Attending: Emergency Medicine | Admitting: Emergency Medicine

## 2012-12-11 DIAGNOSIS — F1910 Other psychoactive substance abuse, uncomplicated: Secondary | ICD-10-CM

## 2012-12-11 DIAGNOSIS — F319 Bipolar disorder, unspecified: Secondary | ICD-10-CM

## 2012-12-11 DIAGNOSIS — F17200 Nicotine dependence, unspecified, uncomplicated: Secondary | ICD-10-CM | POA: Insufficient documentation

## 2012-12-11 DIAGNOSIS — R42 Dizziness and giddiness: Secondary | ICD-10-CM | POA: Insufficient documentation

## 2012-12-11 LAB — URINALYSIS, ROUTINE W REFLEX MICROSCOPIC
Ketones, ur: NEGATIVE mg/dL
Leukocytes, UA: NEGATIVE
Nitrite: NEGATIVE
Specific Gravity, Urine: 1.024 (ref 1.005–1.030)
pH: 6 (ref 5.0–8.0)

## 2012-12-11 LAB — BASIC METABOLIC PANEL
Chloride: 103 mEq/L (ref 96–112)
GFR calc Af Amer: >90 mL/min (ref >90)
Potassium: 4.5 mEq/L (ref 3.5–5.1)
Sodium: 144 mEq/L (ref 135–145)

## 2012-12-11 LAB — RAPID URINE DRUG SCREEN, HOSP PERFORMED
Cocaine: POSITIVE — AB
Opiates: NOT DETECTED

## 2012-12-11 LAB — CBC WITH DIFFERENTIAL/PLATELET
Basophils Absolute: 0.1 10*3/uL (ref 0.0–0.1)
Basophils Relative: 1 % (ref 0–1)
MCHC: 33.5 g/dL (ref 30.0–36.0)
Neutro Abs: 3.2 10*3/uL (ref 1.7–7.7)
Neutrophils Relative %: 40 % — ABNORMAL LOW (ref 43–77)
RDW: 14.1 % (ref 11.5–15.5)

## 2012-12-11 NOTE — ED Notes (Signed)
[facility] called, operator states counselor will call back within 20 minutes to let us know what time she will be here to assess pt.

## 2012-12-11 NOTE — ED Notes (Signed)
Deanna Berry, mobile crisis, states she will be here in 15 minutes to assess pt.

## 2012-12-11 NOTE — ED Notes (Signed)
Placement secured at [facility].  Pt being transported there by [employer].

## 2012-12-11 NOTE — ED Notes (Signed)
MD at bedside. 

## 2012-12-11 NOTE — ED Notes (Signed)
[facility] counselor at bedside.

## 2012-12-11 NOTE — ED Provider Notes (Addendum)
History     CSN: [id number]  Arrival date & time 12/11/12  1451   First MD Initiated Contact with Patient 12/11/12 1511      Chief Complaint  Patient presents with  . Addiction Problem    (Consider location/radiation/quality/duration/timing/severity/associated sxs/prior treatment) Patient is a 42 y.o. female presenting with mental health disorder and drug/alcohol assessment. The history is provided by the patient.  Mental Health Problem Presenting symptoms: no combativeness and no confusion   Presenting symptoms comment:  Patient states she has a history of bipolar disease and depression. She was on medications in the past but when she missed a doctor's appointment she was kicked out of the practice has not been on medications for 8-9 months. She notes since that time her substance abuse has worsened dramatically Severity:  Severe Episode history:  Multiple Timing:  Constant Progression:  Worsening Context: alcohol use and drug use   Associated symptoms: headaches   Associated symptoms: no abdominal pain, no hallucinations and no palpitations   Drug / Alcohol Assessment Similar prior episodes: yes   Severity:  Severe Onset quality:  Gradual Timing:  Constant Progression:  Worsening Chronicity:  Chronic Suspected agents:  Alcohol, cocaine, opiates and prescription drugs Associated symptoms: headaches   Associated symptoms: no abdominal pain, no blackouts, no confusion, no hallucinations, no palpitations, no suicidal ideation and no violence   Risk factors: addiction treatment, mental illness and psychiatric hx     Past Medical History  Diagnosis Date  . Cleft palate   . Sinus congestion   . Headache     Past Surgical History  Procedure Laterality Date  . Cesarean section    . Hernia repair      No family history on file.  History  Substance Use Topics  . Smoking status: Current Every Day Smoker -- 0.25 packs/day    Types: Cigarettes  . Smokeless tobacco: Never  Used  . Alcohol Use: Yes    OB History   Grav Para Term Preterm Abortions TAB SAB Ect Mult Living                  Review of Systems  HENT:       Cleft palate repair and chronic speech impediment  Cardiovascular: Negative for palpitations.  Gastrointestinal: Negative for abdominal pain.  Neurological: Positive for headaches.  Psychiatric/Behavioral: Negative for suicidal ideas, hallucinations and confusion.  All other systems reviewed and are negative.    Allergies  Amoxil  Home Medications   Current Outpatient Rx  Name  Route  Sig  Dispense  Refill  . divalproex (DEPAKOTE ER) 500 MG 24 hr tablet   Oral   Take 1 tablet (500 mg total) by mouth daily.   30 tablet   0   . gabapentin (NEURONTIN) 400 MG capsule   Oral   Take 1 capsule (400 mg total) by mouth at bedtime.   30 capsule   0   . hydrOXYzine (VISTARIL) 100 MG capsule   Oral   Take 1 capsule (100 mg total) by mouth 3 (three) times daily as needed for anxiety.   90 capsule   0   . QUEtiapine (SEROQUEL) 400 MG tablet   Oral   Take 1 tablet (400 mg total) by mouth at bedtime.   30 tablet   0     BP 102/47  Pulse 70  Temp(Src) 97.9 F (36.6 C) (Oral)  Resp 16  Ht 4' 11.5" (1.511 m)  Wt 77 lb 8 oz (35.154  kg)  BMI 15.4 kg/m2  SpO2 100%  LMP 12/09/2012  Physical Exam  Nursing note and vitals reviewed. Constitutional: She is oriented to person, place, and time. She appears well-developed. She appears cachectic. No distress.  HENT:  Head: Normocephalic and atraumatic.  Mouth/Throat: Oropharynx is clear and moist.  Eyes: Conjunctivae and EOM are normal. Pupils are equal, round, and reactive to light.  Neck: Normal range of motion. Neck supple.  Cardiovascular: Normal rate, regular rhythm and intact distal pulses.   No murmur heard. Pulmonary/Chest: Effort normal and breath sounds normal. No respiratory distress. She has no wheezes. She has no rales.  Abdominal: Soft. She exhibits no distension.  There is no tenderness. There is no rebound and no guarding.  Musculoskeletal: Normal range of motion. She exhibits no edema and no tenderness.  Neurological: She is alert and oriented to person, place, and time.  Skin: Skin is warm and dry. No rash noted. No erythema.  Psychiatric: Her behavior is normal. Her mood appears anxious. Her speech is not tangential and not slurred. She is not actively hallucinating. Thought content is not paranoid. She exhibits a depressed mood. She expresses no homicidal and no suicidal ideation.    ED Course  Procedures (including critical care time)  Labs Reviewed  CBC WITH DIFFERENTIAL - Abnormal; Notable for the following:    Neutrophils Relative 40 (*)    Lymphocytes Relative 47 (*)    All other components within normal limits  URINALYSIS, ROUTINE W REFLEX MICROSCOPIC - Abnormal; Notable for the following:    Color, Urine AMBER (*)    APPearance CLOUDY (*)    Bilirubin Urine SMALL (*)    All other components within normal limits  URINE RAPID DRUG SCREEN (HOSP PERFORMED) - Abnormal; Notable for the following:    Cocaine POSITIVE (*)    Benzodiazepines POSITIVE (*)    All other components within normal limits  BASIC METABOLIC PANEL  ETHANOL   No results found.   1. Polysubstance abuse   2. Bipolar disease, chronic       MDM   Patient is here requesting detox from alcohol, opiates, Xanax and cocaine. She states she's been using heavily for the last 8-9 months. She contributes her heavier use of these substances to the fact that she missed a doctor's appointment back in that time area and was taken off all of her psychiatric medications. She states she takes her medications for bipolar disease and depression however she's not been on those medications for over 8-9 months now. She denies any suicidal ideation but states she goes between being manic and depressed all the time. She states she's not sleeping well now. Her last alcohol use was 2 days  ago she does not appear intoxicated on exam. She denies any other medical problems. Medical screening labs sent and will have mobile crisis come and evaluate the patient for both detox and psychiatric evaluation  4:54 PM Labs normal.  Pt medically clear.  Pt was accepted at [facility] and d/ced with mobile crisis      Gwyneth Sprout, MD 12/11/12 1900  Gwyneth Sprout, MD 12/11/12 2151

## 2012-12-11 NOTE — ED Notes (Signed)
Pt requesting detox from drugs "opiates, alcohol, xanax"-last ETOH 2 days ago-last drug intake yesterday

## 2012-12-11 NOTE — ED Notes (Signed)
Pt reports that she thought she was at [facility].  Sts that her husband has had rehab there and that's where she wanted to be.  Sts she put the address in her GPS and it brought her here.  Pt requested to speak with [facility] again and she came to bedside and gave pt another update on the status.  She is waiting for callback from [facility] for possible placement.

## 2014-10-31 HISTORY — PX: COLONOSCOPY: SHX174

## 2014-12-11 ENCOUNTER — Other Ambulatory Visit (HOSPITAL_COMMUNITY): Payer: Self-pay | Admitting: Family Medicine

## 2014-12-11 DIAGNOSIS — Z139 Encounter for screening, unspecified: Secondary | ICD-10-CM

## 2014-12-15 ENCOUNTER — Ambulatory Visit (HOSPITAL_COMMUNITY): Payer: Self-pay

## 2015-03-01 HISTORY — PX: ESOPHAGOGASTRODUODENOSCOPY: SHX1529

## 2015-06-01 HISTORY — PX: ESOPHAGOGASTRODUODENOSCOPY: SHX1529

## 2016-09-15 DIAGNOSIS — F3113 Bipolar disorder, current episode manic without psychotic features, severe: Secondary | ICD-10-CM | POA: Diagnosis not present

## 2016-10-25 DIAGNOSIS — Z6824 Body mass index (BMI) 24.0-24.9, adult: Secondary | ICD-10-CM | POA: Diagnosis not present

## 2016-10-25 DIAGNOSIS — E669 Obesity, unspecified: Secondary | ICD-10-CM | POA: Diagnosis not present

## 2016-10-25 DIAGNOSIS — H6693 Otitis media, unspecified, bilateral: Secondary | ICD-10-CM | POA: Diagnosis not present

## 2016-10-25 DIAGNOSIS — Z1389 Encounter for screening for other disorder: Secondary | ICD-10-CM | POA: Diagnosis not present

## 2016-10-25 DIAGNOSIS — F319 Bipolar disorder, unspecified: Secondary | ICD-10-CM | POA: Diagnosis not present

## 2016-12-30 DIAGNOSIS — F3113 Bipolar disorder, current episode manic without psychotic features, severe: Secondary | ICD-10-CM | POA: Diagnosis not present

## 2017-01-03 DIAGNOSIS — F1421 Cocaine dependence, in remission: Secondary | ICD-10-CM | POA: Diagnosis not present

## 2017-01-03 DIAGNOSIS — F319 Bipolar disorder, unspecified: Secondary | ICD-10-CM | POA: Diagnosis not present

## 2017-01-03 DIAGNOSIS — F1121 Opioid dependence, in remission: Secondary | ICD-10-CM | POA: Diagnosis not present

## 2017-02-02 DIAGNOSIS — K567 Ileus, unspecified: Secondary | ICD-10-CM | POA: Diagnosis not present

## 2017-02-02 DIAGNOSIS — Z883 Allergy status to other anti-infective agents status: Secondary | ICD-10-CM | POA: Diagnosis not present

## 2017-02-02 DIAGNOSIS — F17200 Nicotine dependence, unspecified, uncomplicated: Secondary | ICD-10-CM | POA: Diagnosis not present

## 2017-02-02 DIAGNOSIS — R200 Anesthesia of skin: Secondary | ICD-10-CM | POA: Diagnosis not present

## 2017-02-02 DIAGNOSIS — R1032 Left lower quadrant pain: Secondary | ICD-10-CM | POA: Diagnosis not present

## 2017-02-02 DIAGNOSIS — Z888 Allergy status to other drugs, medicaments and biological substances status: Secondary | ICD-10-CM | POA: Diagnosis not present

## 2017-02-02 DIAGNOSIS — Z881 Allergy status to other antibiotic agents status: Secondary | ICD-10-CM | POA: Diagnosis not present

## 2017-02-02 DIAGNOSIS — T50995A Adverse effect of other drugs, medicaments and biological substances, initial encounter: Secondary | ICD-10-CM | POA: Diagnosis not present

## 2017-02-02 DIAGNOSIS — F319 Bipolar disorder, unspecified: Secondary | ICD-10-CM | POA: Diagnosis not present

## 2017-02-02 DIAGNOSIS — N12 Tubulo-interstitial nephritis, not specified as acute or chronic: Secondary | ICD-10-CM | POA: Diagnosis not present

## 2017-02-02 DIAGNOSIS — R7889 Finding of other specified substances, not normally found in blood: Secondary | ICD-10-CM | POA: Diagnosis not present

## 2017-02-02 DIAGNOSIS — D649 Anemia, unspecified: Secondary | ICD-10-CM | POA: Diagnosis not present

## 2017-02-02 DIAGNOSIS — R531 Weakness: Secondary | ICD-10-CM | POA: Diagnosis not present

## 2017-02-02 DIAGNOSIS — K219 Gastro-esophageal reflux disease without esophagitis: Secondary | ICD-10-CM | POA: Diagnosis not present

## 2017-02-02 DIAGNOSIS — K5900 Constipation, unspecified: Secondary | ICD-10-CM | POA: Diagnosis not present

## 2017-02-02 DIAGNOSIS — Z79899 Other long term (current) drug therapy: Secondary | ICD-10-CM | POA: Diagnosis not present

## 2017-02-02 DIAGNOSIS — R110 Nausea: Secondary | ICD-10-CM | POA: Diagnosis not present

## 2017-02-02 DIAGNOSIS — R109 Unspecified abdominal pain: Secondary | ICD-10-CM | POA: Diagnosis not present

## 2017-02-02 DIAGNOSIS — R42 Dizziness and giddiness: Secondary | ICD-10-CM | POA: Diagnosis not present

## 2017-02-03 DIAGNOSIS — K5900 Constipation, unspecified: Secondary | ICD-10-CM | POA: Diagnosis not present

## 2017-02-03 DIAGNOSIS — N390 Urinary tract infection, site not specified: Secondary | ICD-10-CM | POA: Diagnosis not present

## 2017-02-06 DIAGNOSIS — Z1389 Encounter for screening for other disorder: Secondary | ICD-10-CM | POA: Diagnosis not present

## 2017-02-06 DIAGNOSIS — K5900 Constipation, unspecified: Secondary | ICD-10-CM | POA: Diagnosis not present

## 2017-02-06 DIAGNOSIS — Z6822 Body mass index (BMI) 22.0-22.9, adult: Secondary | ICD-10-CM | POA: Diagnosis not present

## 2017-02-06 DIAGNOSIS — R109 Unspecified abdominal pain: Secondary | ICD-10-CM | POA: Diagnosis not present

## 2017-02-06 DIAGNOSIS — R140 Abdominal distension (gaseous): Secondary | ICD-10-CM | POA: Diagnosis not present

## 2017-02-19 DIAGNOSIS — R1013 Epigastric pain: Secondary | ICD-10-CM | POA: Diagnosis not present

## 2017-02-23 ENCOUNTER — Encounter (HOSPITAL_COMMUNITY): Payer: Self-pay | Admitting: Emergency Medicine

## 2017-02-23 ENCOUNTER — Encounter: Payer: Self-pay | Admitting: Gastroenterology

## 2017-02-23 ENCOUNTER — Telehealth: Payer: Self-pay

## 2017-02-23 ENCOUNTER — Encounter (HOSPITAL_COMMUNITY): Payer: Self-pay

## 2017-02-23 ENCOUNTER — Ambulatory Visit (INDEPENDENT_AMBULATORY_CARE_PROVIDER_SITE_OTHER): Payer: Self-pay | Admitting: Gastroenterology

## 2017-02-23 ENCOUNTER — Ambulatory Visit (HOSPITAL_COMMUNITY)
Admission: RE | Admit: 2017-02-23 | Discharge: 2017-02-23 | Disposition: A | Discharge status: Home / Self Care | Payer: BLUE CROSS/BLUE SHIELD | Source: Ambulatory Visit | Attending: Gastroenterology | Admitting: Gastroenterology

## 2017-02-23 ENCOUNTER — Emergency Department (HOSPITAL_COMMUNITY)
Admission: EM | Admit: 2017-02-23 | Discharge: 2017-02-24 | Disposition: A | Discharge status: Home / Self Care | Payer: BLUE CROSS/BLUE SHIELD | Attending: Emergency Medicine | Admitting: Emergency Medicine

## 2017-02-23 ENCOUNTER — Other Ambulatory Visit (HOSPITAL_COMMUNITY)
Admission: RE | Admit: 2017-02-23 | Discharge: 2017-02-23 | Disposition: A | Discharge status: Home / Self Care | Payer: BLUE CROSS/BLUE SHIELD | Source: Ambulatory Visit | Attending: Gastroenterology | Admitting: Gastroenterology

## 2017-02-23 DIAGNOSIS — K5901 Slow transit constipation: Secondary | ICD-10-CM

## 2017-02-23 DIAGNOSIS — R1084 Generalized abdominal pain: Secondary | ICD-10-CM | POA: Insufficient documentation

## 2017-02-23 DIAGNOSIS — R112 Nausea with vomiting, unspecified: Secondary | ICD-10-CM | POA: Diagnosis not present

## 2017-02-23 DIAGNOSIS — R339 Retention of urine, unspecified: Secondary | ICD-10-CM

## 2017-02-23 DIAGNOSIS — R140 Abdominal distension (gaseous): Secondary | ICD-10-CM

## 2017-02-23 DIAGNOSIS — K828 Other specified diseases of gallbladder: Secondary | ICD-10-CM

## 2017-02-23 DIAGNOSIS — F17210 Nicotine dependence, cigarettes, uncomplicated: Secondary | ICD-10-CM | POA: Insufficient documentation

## 2017-02-23 DIAGNOSIS — N1330 Unspecified hydronephrosis: Secondary | ICD-10-CM | POA: Insufficient documentation

## 2017-02-23 DIAGNOSIS — K5641 Fecal impaction: Secondary | ICD-10-CM

## 2017-02-23 DIAGNOSIS — R600 Localized edema: Secondary | ICD-10-CM | POA: Insufficient documentation

## 2017-02-23 DIAGNOSIS — Z79899 Other long term (current) drug therapy: Secondary | ICD-10-CM | POA: Diagnosis not present

## 2017-02-23 DIAGNOSIS — R319 Hematuria, unspecified: Secondary | ICD-10-CM | POA: Diagnosis not present

## 2017-02-23 DIAGNOSIS — N390 Urinary tract infection, site not specified: Secondary | ICD-10-CM | POA: Diagnosis not present

## 2017-02-23 DIAGNOSIS — R1110 Vomiting, unspecified: Secondary | ICD-10-CM | POA: Diagnosis not present

## 2017-02-23 DIAGNOSIS — R109 Unspecified abdominal pain: Secondary | ICD-10-CM | POA: Insufficient documentation

## 2017-02-23 LAB — COMPREHENSIVE METABOLIC PANEL
ALT: 20 U/L (ref 14–54)
AST: 25 U/L (ref 15–41)
Albumin: 2.3 g/dL — ABNORMAL LOW (ref 3.5–5.0)
Alkaline Phosphatase: 81 U/L (ref 38–126)
Anion gap: 13 (ref 5–15)
BUN: 17 mg/dL (ref 6–20)
CALCIUM: 7.8 mg/dL — AB (ref 8.9–10.3)
CO2: 20 mmol/L — ABNORMAL LOW (ref 22–32)
CREATININE: 1.38 mg/dL — AB (ref 0.44–1.00)
Chloride: 101 mmol/L (ref 101–111)
GFR, EST AFRICAN AMERICAN: 53 mL/min — AB (ref >60)
GFR, EST NON AFRICAN AMERICAN: 45 mL/min — AB (ref >60)
Glucose, Bld: 80 mg/dL (ref 65–99)
Potassium: 3.7 mmol/L (ref 3.5–5.1)
Sodium: 134 mmol/L — ABNORMAL LOW (ref 135–145)
TOTAL PROTEIN: 6.2 g/dL — AB (ref 6.5–8.1)
Total Bilirubin: 2 mg/dL — ABNORMAL HIGH (ref 0.3–1.2)

## 2017-02-23 LAB — LACTIC ACID, PLASMA: LACTIC ACID, VENOUS: 1.2 mmol/L (ref 0.5–1.9)

## 2017-02-23 LAB — CBC WITH DIFFERENTIAL/PLATELET
Basophils Absolute: 0.1 10*3/uL (ref 0.0–0.1)
Basophils Relative: 1 %
EOS PCT: 5 %
Eosinophils Absolute: 0.6 10*3/uL (ref 0.0–0.7)
HCT: 26.3 % — ABNORMAL LOW (ref 36.0–46.0)
HEMOGLOBIN: 8.4 g/dL — AB (ref 12.0–15.0)
LYMPHS ABS: 1.8 10*3/uL (ref 0.7–4.0)
LYMPHS PCT: 17 %
MCH: 27.7 pg (ref 26.0–34.0)
MCHC: 31.9 g/dL (ref 30.0–36.0)
MCV: 86.8 fL (ref 78.0–100.0)
Monocytes Absolute: 1.1 10*3/uL — ABNORMAL HIGH (ref 0.1–1.0)
Monocytes Relative: 10 %
NEUTROS ABS: 7 10*3/uL (ref 1.7–7.7)
Neutrophils Relative %: 67 %
Platelets: 700 10*3/uL — ABNORMAL HIGH (ref 150–400)
RBC: 3.03 MIL/uL — AB (ref 3.87–5.11)
RDW: 15.9 % — ABNORMAL HIGH (ref 11.5–15.5)
WBC: 10.5 10*3/uL (ref 4.0–10.5)

## 2017-02-23 LAB — URINALYSIS, ROUTINE W REFLEX MICROSCOPIC
GLUCOSE, UA: NEGATIVE mg/dL
Ketones, ur: 20 mg/dL — AB
Leukocytes, UA: NEGATIVE
Nitrite: NEGATIVE
PH: 5 (ref 5.0–8.0)
PROTEIN: 100 mg/dL — AB
Specific Gravity, Urine: 1.045 — ABNORMAL HIGH (ref 1.005–1.030)

## 2017-02-23 LAB — HCG, QUANTITATIVE, PREGNANCY: hCG, Beta Chain, Quant, S: <1 m[IU]/mL (ref <5)

## 2017-02-23 LAB — POC OCCULT BLOOD, ED: Fecal Occult Bld: NEGATIVE

## 2017-02-23 LAB — PREGNANCY, URINE: PREG TEST UR: NEGATIVE

## 2017-02-23 MED ORDER — IOPAMIDOL (ISOVUE-300) INJECTION 61%
80.0000 mL | Freq: Once | INTRAVENOUS | Status: AC | PRN
Start: 2017-02-23 — End: 2017-02-23
  Administered 2017-02-23: 80 mL via INTRAVENOUS

## 2017-02-23 MED ORDER — KETOROLAC TROMETHAMINE 30 MG/ML IJ SOLN
30.0000 mg | Freq: Once | INTRAMUSCULAR | Status: AC
  Administered 2017-02-23: 30 mg via INTRAVENOUS
  Filled 2017-02-23: qty 1

## 2017-02-23 MED ORDER — CEFTRIAXONE SODIUM 1 G IJ SOLR
1.0000 g | Freq: Once | INTRAMUSCULAR | Status: AC
  Administered 2017-02-23: 1 g via INTRAVENOUS
  Filled 2017-02-23: qty 10

## 2017-02-23 MED ORDER — SODIUM CHLORIDE 0.9 % IV BOLUS (SEPSIS): 1000.0000 mL | Freq: Once | INTRAVENOUS | Status: DC

## 2017-02-23 MED ORDER — MILK AND MOLASSES ENEMA
1.0000 | Freq: Once | RECTAL | Status: AC
  Administered 2017-02-23: 250 mL via RECTAL
  Filled 2017-02-23: qty 250

## 2017-02-23 MED ORDER — KETOROLAC TROMETHAMINE 60 MG/2ML IM SOLN: 30.0000 mg | Freq: Once | INTRAMUSCULAR | Status: DC

## 2017-02-23 MED ORDER — IOPAMIDOL (ISOVUE-300) INJECTION 61%
INTRAVENOUS | Status: AC
  Filled 2017-02-23: qty 30

## 2017-02-23 MED ORDER — CIPROFLOXACIN HCL 500 MG PO TABS: 500.0000 mg | ORAL_TABLET | Freq: Two times a day (BID) | ORAL | 0 refills | Status: DC

## 2017-02-23 NOTE — ED Provider Notes (Signed)
[facility] DEPT Provider Note   CSN: [id number] Arrival date & time: 02/23/17  1525     History   Chief Complaint Chief Complaint  Patient presents with  . Abnormal Lab    HPI Deanna Berry is a 46 y.o. female.  HPI  46 year old female who presents with abnormal CT scan. Reports 1.5 weeks of constipation with increased abdominal distension, occasional N/V, and decreased urine output and PO intake. Seen by GI clinic today and sent for outpatient CT scan that was suggestive of constipation but kidney findings that could suggest pyelonephritis. Complains of left flank discomfort from distension. Denies fever or chills, melena, hematochezia, chest pain, dyspnea, dysuria, hematuria. States she has had decreased urination since constipation. Tried miralax, colace, doculax suppositories, without improvement in constipation. Pain in abdomen worse with movement.   Past Medical History:  Diagnosis Date  . Bipolar disorder ([place])   . Cleft palate   . Headache(784.0)   . PUD (peptic ulcer disease)   . Sinus congestion     Patient Active Problem List   Diagnosis Date Noted  . Abdominal pain 02/23/2017  . Bipolar 1 disorder ([place]) 05/31/2012  . Substance abuse 05/31/2012    Past Surgical History:  Procedure Laterality Date  . CESAREAN SECTION    . HERNIA REPAIR      OB History    No data available       Home Medications    Prior to Admission medications   Medication Sig Start Date End Date Taking? Authorizing Provider  amitriptyline (ELAVIL) 50 MG tablet Take 50 mg by mouth at bedtime. 01/25/17  Yes Historical Provider, MD  carbamazepine (TEGRETOL XR) 200 MG 12 hr tablet Take 200 mg by mouth 2 (two) times daily. 02/10/17  Yes Historical Provider, MD  gabapentin (NEURONTIN) 800 MG tablet Take 800 mg by mouth 3 (three) times daily. 01/30/17  Yes Historical Provider, MD  naltrexone (DEPADE) 50 MG tablet Take 50 mg by mouth daily. 02/16/17  Yes Historical Provider, MD  pantoprazole  (PROTONIX) 40 MG tablet Take 1 tablet by mouth daily. 01/30/17  Yes Historical Provider, MD  QUEtiapine (SEROQUEL XR) 300 MG 24 hr tablet Take 600 mg by mouth at bedtime. 01/25/17  Yes Historical Provider, MD  RA COL-RITE 100 MG capsule Take 100 mg by mouth 2 (two) times daily. 02/20/17  Yes Historical Provider, MD  TRINTELLIX 20 MG TABS Take 20 mg by mouth daily. 02/10/17  Yes Historical Provider, MD  ciprofloxacin (CIPRO) 500 MG tablet Take 1 tablet (500 mg total) by mouth 2 (two) times daily. 02/23/17   Forde Dandy, MD  divalproex (DEPAKOTE ER) 500 MG 24 hr tablet Take 1 tablet (500 mg total) by mouth daily. 06/21/12 06/21/13  Kathlee Nations, MD  gabapentin (NEURONTIN) 400 MG capsule Take 1 capsule (400 mg total) by mouth at bedtime. 06/21/12 06/21/13  Kathlee Nations, MD  QUEtiapine (SEROQUEL) 400 MG tablet Take 1 tablet (400 mg total) by mouth at bedtime. 06/20/12 12/28/12  Kathlee Nations, MD    Family History Family History  Problem Relation Age of Onset  . Colon cancer Mother 33    Social History Social History  Substance Use Topics  . Smoking status: Current Every Day Smoker    Packs/day: 1.00    Types: Cigarettes  . Smokeless tobacco: Never Used  . Alcohol use No     Allergies   Amoxil [amoxicillin]   Review of Systems Review of Systems  Constitutional: Negative for  fever.  Respiratory: Negative for shortness of breath.   Cardiovascular: Negative for chest pain.  Gastrointestinal: Positive for abdominal distention and abdominal pain.  Genitourinary: Positive for difficulty urinating.  Musculoskeletal: Positive for back pain.  Allergic/Immunologic: Negative for immunocompromised state.  Hematological: Does not bruise/bleed easily.  Psychiatric/Behavioral: Negative for confusion.  All other systems reviewed and are negative.    Physical Exam Updated Vital Signs BP 114/78   Pulse 83   Temp 98.2 F (36.8 C) (Oral)   Resp 16   Ht 5' (1.524 m)   Wt 122 lb (55.3 kg)   LMP  02/02/2017 (Approximate)   SpO2 97%   BMI 23.83 kg/m   Physical Exam Physical Exam  Nursing note and vitals reviewed. Constitutional:  non-toxic, and in no acute distress Head: Normocephalic and atraumatic.  Mouth/Throat: Oropharynx is clear and moist.  Neck: Normal range of motion. Neck supple.  Cardiovascular: Normal rate and regular rhythm.   Pulmonary/Chest: Effort normal and breath sounds normal.  Abdominal: Soft. Moderate distension. There is no significant abdominal tenderness. There is no rebound and no guarding. no fecal impaction on rectal exam. Brown stool. Musculoskeletal: Normal range of motion.  Neurological: Alert, no facial droop, fluent speech, moves all extremities symmetrically Skin: Skin is warm and dry.  Psychiatric: Cooperative   ED Treatments / Results  Labs (all labs ordered are listed, but only abnormal results are displayed) Labs Reviewed  CBC WITH DIFFERENTIAL/PLATELET - Abnormal; Notable for the following:       Result Value   RBC 3.03 (*)    Hemoglobin 8.4 (*)    HCT 26.3 (*)    RDW 15.9 (*)    Platelets 700 (*)    Monocytes Absolute 1.1 (*)    All other components within normal limits  COMPREHENSIVE METABOLIC PANEL - Abnormal; Notable for the following:    Sodium 134 (*)    CO2 20 (*)    Creatinine, Ser 1.38 (*)    Calcium 7.8 (*)    Total Protein 6.2 (*)    Albumin 2.3 (*)    Total Bilirubin 2.0 (*)    GFR calc non Af Amer 45 (*)    GFR calc Af Amer 53 (*)    All other components within normal limits  URINALYSIS, ROUTINE W REFLEX MICROSCOPIC - Abnormal; Notable for the following:    Color, Urine AMBER (*)    APPearance CLOUDY (*)    Specific Gravity, Urine 1.045 (*)    Hgb urine dipstick LARGE (*)    Bilirubin Urine SMALL (*)    Ketones, ur 20 (*)    Protein, ur 100 (*)    Bacteria, UA RARE (*)    Squamous Epithelial / LPF 6-30 (*)    All other components within normal limits  URINE CULTURE  LACTIC ACID, PLASMA  PREGNANCY, URINE   POC OCCULT BLOOD, ED    EKG  EKG Interpretation None       Radiology Ct Abdomen Pelvis W Contrast  Result Date: 02/23/2017 CLINICAL DATA:  Worsening constipation with abdominal distention and tenderness. Nausea and vomiting this morning. EXAM: CT ABDOMEN AND PELVIS WITH CONTRAST TECHNIQUE: Multidetector CT imaging of the abdomen and pelvis was performed using the standard protocol following bolus administration of intravenous contrast. CONTRAST:  28mL ISOVUE-300 IOPAMIDOL (ISOVUE-300) INJECTION 61% COMPARISON:  02/02/2017 FINDINGS: Lower chest:  Unremarkable. Hepatobiliary: No focal abnormality within the liver parenchyma. Mild intrahepatic biliary duct distention is stable. Marked distention of the gallbladder is stable. No substantial extrahepatic  biliary duct dilatation. Pancreas: No focal mass lesion. No dilatation of the main duct. No intraparenchymal cyst. No peripancreatic edema. Spleen: No splenomegaly. No focal mass lesion. Adrenals/Urinary Tract: No adrenal nodule or mass. Cortical thinning noted in the kidneys bilaterally with areas of segmental a decreased cortical perfusion bilaterally. This appearance can be seen in the setting of pyelonephritis. There is mild right hydroureteronephrosis, new in the interval with distention of the right ureter down to the distal right ureter/right UVJ where asymmetric soft tissue attenuation/ enhancement is identified when compared to the left. Stomach/Bowel: Stomach is nondistended. No gastric wall thickening. No evidence of outlet obstruction. Duodenum is normally positioned as is the ligament of Treitz. No small bowel wall thickening. No small bowel dilatation. The terminal ileum is normal. The appendix is normal. Colon is prominently stool distended throughout. Vascular/Lymphatic: There is abdominal aortic atherosclerosis without aneurysm. Scattered small lymph nodes identified in the root of the small bowel mesentery. No retroperitoneal  lymphadenopathy. No pelvic sidewall lymphadenopathy. Reproductive: The uterus has normal CT imaging appearance. There is no adnexal mass. Other: No intraperitoneal free fluid. Musculoskeletal: Bone windows reveal no worrisome lytic or sclerotic osseous lesions. IMPRESSION: 1. Persistent segmentally decreased perfusion or segmental edema in the kidneys bilaterally, similar to prior study. As before, this CT imaging appearance does raise the question of pyelonephritis. Since the previous study the patient has developed mild right hydroureteronephrosis and there is decreased excretion from the right kidney consistent with obstructive uropathy. The right ureter is dilated into the pelvis where there is abnormal soft tissue/enhancement in the distal right ureter and right UVJ. 2. Persistent distended and stool-filled colon. Imaging features would be compatible with constipation in the appropriate clinical setting. 3. Persistent marked gallbladder distention, nonspecific. Electronically Signed   By: Misty Stanley M.D.   On: 02/23/2017 15:00    Procedures Procedures (including critical care time)  Medications Ordered in ED Medications  ketorolac (TORADOL) injection 30 mg (30 mg Intramuscular Not Given 02/23/17 1915)  ketorolac (TORADOL) 30 MG/ML injection 30 mg (30 mg Intravenous Given 02/23/17 1915)  milk and molasses enema (250 mLs Rectal Given 02/23/17 2130)  cefTRIAXone (ROCEPHIN) 1 g in dextrose 5 % 50 mL IVPB (1 g Intravenous New Bag/Given 02/23/17 2231)     Initial Impression / Assessment and Plan / ED Course  I have reviewed the triage vital signs and the nursing notes.  Pertinent labs & imaging results that were available during my care of the patient were reviewed by me and considered in my medical decision making (see chart for details).      I reviewed the available records in [facility] and also Care Everywhere. CT scan reviewed from earlier today.There is mild right hydroureteronephrosis and  decreased excretion from the right kidney, consistent assistant with obstructive uropathy. She is retaining 600 mL of urine on bladder scan, and states that she is unable to urinate. Foley catheter is placed. Suspect that this is from her constipation.  UA suggestive of infection, and it is sent for culture. Given a dose of ceftriaxone and given potential CT findings of pyelonephritis, although these changes has been seen on prior CT scans. She has no fever, leukocytosis, or signs of systemic illness. Will be treated as outpatient with course of ciprofloxacin. Given her Foley catheter, will also refer her to urology for trial of urination within a week.  Remainder of her blood work is reassuring. She did receive an enema with improvement in her symptoms. I was prescribed Linzess by  her GI physician earlier today which she will start taking. She will continue to follow up with GI.  She is felt to be stable for discharge home. Strict return and follow-up instructions reviewed. She expressed understanding of all discharge instructions and felt comfortable with the plan of care.      Final Clinical Impressions(s) / ED Diagnoses   Final diagnoses:  Slow transit constipation  Urinary retention  Urinary tract infection with hematuria, site unspecified    New Prescriptions New Prescriptions   CIPROFLOXACIN (CIPRO) 500 MG TABLET    Take 1 tablet (500 mg total) by mouth 2 (two) times daily.     Forde Dandy, MD 02/23/17 [phone]

## 2017-02-23 NOTE — Progress Notes (Signed)
Primary Care Physician:  Glo Herring, MD Primary Gastroenterologist:  Dr. Oneida Alar   Chief Complaint  Patient presents with  . Constipation  . Abdominal Pain  . Bloated    HPI:   Deanna Berry is a 46 y.o. female presenting today at the request of her PCP secondary to constipation.    Notes chronic history of constipation but now worsened. States she can barely walk, no energy, abdomen feels tight and wraps around her left side. Had an xray at [place]. I do not have these findings. Tearful when I entered the room. Last BM about 2 weeks ago.   Went to [facility] but states she thinks they thought she was drug-seeking. Primary care gave samples of Amitiza 8 mcg.  Given col-rite 100 mg BID from ED. Given Keflex for UTI. Has left flank pain. Hit left eye last night after taking Amitiza 8 mcg, 4 capsules all at once.   Colonoscopy by Dr. Britta Mccreedy a few years ago. EGD with ulcers per patient. Repeat EGD good per patient. Need these results, which I do not have at time of visit.   Past Medical History:  Diagnosis Date  . Bipolar disorder ([place])   . Cleft palate   . Headache(784.0)   . PUD (peptic ulcer disease)   . Sinus congestion     Past Surgical History:  Procedure Laterality Date  . CESAREAN SECTION    . HERNIA REPAIR      Current Outpatient Prescriptions  Medication Sig Dispense Refill  . amitriptyline (ELAVIL) 50 MG tablet Take 50 mg by mouth at bedtime.  0  . carbamazepine (TEGRETOL XR) 200 MG 12 hr tablet Take 200 mg by mouth 2 (two) times daily.  0  . gabapentin (NEURONTIN) 800 MG tablet Take 800 mg by mouth 3 (three) times daily.  0  . naltrexone (DEPADE) 50 MG tablet Take 50 mg by mouth daily.  0  . pantoprazole (PROTONIX) 40 MG tablet Take 1 tablet by mouth daily.  0  . QUEtiapine (SEROQUEL XR) 300 MG 24 hr tablet Take 600 mg by mouth at bedtime.  0  . TRINTELLIX 20 MG TABS Take 20 mg by mouth daily.  0  . divalproex (DEPAKOTE ER) 500 MG 24 hr tablet Take 1 tablet  (500 mg total) by mouth daily. 30 tablet 0  . gabapentin (NEURONTIN) 400 MG capsule Take 1 capsule (400 mg total) by mouth at bedtime. 30 capsule 0  . QUEtiapine (SEROQUEL) 400 MG tablet Take 1 tablet (400 mg total) by mouth at bedtime. 30 tablet 0  . RA COL-RITE 100 MG capsule Take 100 mg by mouth 2 (two) times daily.  0   No current facility-administered medications for this visit.    Facility-Administered Medications Ordered in Other Visits  Medication Dose Route Frequency Provider Last Rate Last Dose  . iopamidol (ISOVUE-300) 61 % injection             Allergies as of 02/23/2017 - Review Complete 02/23/2017  Allergen Reaction Noted  . Amoxil [amoxicillin] Hives 12/11/2012    Family History  Problem Relation Age of Onset  . Colon cancer Mother 31    Social History   Social History  . Marital status: Single    Spouse name: N/A  . Number of children: N/A  . Years of education: N/A   Occupational History  . office manager    Social History Main Topics  . Smoking status: Current Every Day Smoker    Packs/day:  1.00    Types: Cigarettes  . Smokeless tobacco: Never Used  . Alcohol use No  . 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 Sexual activity: Not on file   Other Topics Concern  . Not on file   Social History Narrative  . No narrative on file    Review of Systems: Gen: see HPI  CV: Denies chest pain, heart palpitations, peripheral edema, syncope.  Resp: Denies shortness of breath at rest or with exertion. Denies wheezing or cough.  GI: see HPI  GU : Denies urinary burning, urinary frequency, urinary hesitancy MS: Denies joint pain, muscle weakness, cramps, or limitation of movement.  Derm: Denies rash, itching, dry skin Psych: +bipolar  Heme: Denies bruising, bleeding, and enlarged lymph nodes.  Physical Exam: BP 106/79   Pulse (!) 101   Temp 97.1 F (36.2 C) (Oral)   Ht 5' (1.524 m)   Wt 122 lb 3.2 oz (55.4 kg)   LMP  02/02/2017 (Approximate)   BMI 23.87 kg/m  General:   Alert and oriented. Tearful. Left periorbital bruising.  Head:  Normocephalic and atraumatic. Ears:  Normal auditory acuity. Nose:  No deformity, discharge,  or lesions. Mouth:  No deformity or lesions, oral mucosa pink.  Lungs:  Clear to auscultation bilaterally. No wheezes, rales, or rhonchi. No distress.  Heart:  S1, S2 present without murmurs appreciated.  Abdomen:  +BS, moderately distended and full, moderate TTP diffusely. No rrebound. No masses appreciated.  Rectal:  No external abnormalities. Internal exam without impaction or mass Msk:  Symmetrical without gross deformities. Normal posture. Extremities:  Without  edema. Neurologic:  Alert and  oriented x4 Psych:  Alert and cooperative. Normal mood and affect.

## 2017-02-23 NOTE — ED Notes (Addendum)
Pt with no real results from enema given in room, asked pt to used [facility] in room to try to have a BM

## 2017-02-23 NOTE — ED Notes (Signed)
No results at this time, pt still laying on left side and comfortable.  Pt states foley has helped some.  EDP made aware of no results as well.

## 2017-02-23 NOTE — Discharge Instructions (Signed)
Please take antibiotics for potential urinary tract infection.   You have a urinary catheter placed. Please call urology (listed below) tomorrow morning to set up follow-up in 1 week. They will then take catheter out and have you try urinating on your own.  Please take Linzess prescribed by your GI doctor for constipation. Please also call them for close follow-up.  Return without fail for worsening symptoms, including fever, intractable vomiting, worsening pain, confusion or any other symptoms concerning to you.

## 2017-02-23 NOTE — ED Notes (Signed)
Changed foley bag to a leg bag and instructed pt on how to empty and secure to leg; pt verbalized understanding; pt asking for washcloths to clean up with, wash cloths given

## 2017-02-23 NOTE — ED Notes (Signed)
Milk and molasses enema done, pt tolerated well.  Will reassess

## 2017-02-23 NOTE — Patient Instructions (Addendum)
I have ordered a CT scan for you today. They will do a pregnancy test prior as you are of child-bearing age.   I will let you know the results of the CT. If all is well, then we will proceed with taking Linzess 1 capsule each morning with food. This may cause diarrhea, but continue.   Further recommendations to follow.

## 2017-02-23 NOTE — Assessment & Plan Note (Signed)
46 year old female with worsening constipation, presenting to clinic with failure of OTC agents and Amitiza 8 mcg. Reportedly, she had a colonoscopy and EGD by Dr. Britta Mccreedy a few years ago, which I have requested. No outside labs or imaging available at time of visit. On exam, she is moderately distended and full. I doubt bowel obstruction, but she is in obvious discomfort. Will proceed with stat CT now. No impaction on rectal exam.   If CT normal, will start Linzess 290 mcg daily with novel dosing of taking WITH food.

## 2017-02-23 NOTE — ED Notes (Signed)
Attempted x 2 for IV access without success.  

## 2017-02-23 NOTE — ED Notes (Signed)
Patient stated she does not need to use restroom now.  Patient states that she normally does not use restroom but "maybe twice daily".  Last time she went to restroom was this morning.  Advised patient that we needed urine specimen

## 2017-02-23 NOTE — ED Triage Notes (Signed)
Pt reports being sent for abnml ct scan.  Study shows constipation and hydronephrosis due to possible obstruction.

## 2017-02-23 NOTE — Telephone Encounter (Signed)
-----   Message from Annitta Needs, NP sent at 02/23/2017 12:03 PM EDT ----- They have approved it. The approval # is [id number] ----- Message ----- From: Marlou Porch, CMA Sent: 02/23/2017  11:23 AM To: Annitta Needs, NP  Her insurance did not approve the CT scan. You will need to call and talk with someone at [phone].  Her ID# is [id number].  Thanks  Lorae Roig

## 2017-02-24 NOTE — Progress Notes (Signed)
CC'D TO PCP °

## 2017-02-24 NOTE — Progress Notes (Signed)
Thank you for addressing! Let's have patient return in 4 weeks to see me.

## 2017-02-25 DIAGNOSIS — R339 Retention of urine, unspecified: Secondary | ICD-10-CM | POA: Diagnosis not present

## 2017-02-25 DIAGNOSIS — K219 Gastro-esophageal reflux disease without esophagitis: Secondary | ICD-10-CM | POA: Diagnosis not present

## 2017-02-25 DIAGNOSIS — F319 Bipolar disorder, unspecified: Secondary | ICD-10-CM | POA: Diagnosis not present

## 2017-02-25 DIAGNOSIS — Z79899 Other long term (current) drug therapy: Secondary | ICD-10-CM | POA: Diagnosis not present

## 2017-02-25 DIAGNOSIS — T839XXA Unspecified complication of genitourinary prosthetic device, implant and graft, initial encounter: Secondary | ICD-10-CM | POA: Diagnosis not present

## 2017-02-25 DIAGNOSIS — F17200 Nicotine dependence, unspecified, uncomplicated: Secondary | ICD-10-CM | POA: Diagnosis not present

## 2017-02-25 LAB — URINE CULTURE: Culture: NO GROWTH

## 2017-02-26 DIAGNOSIS — F319 Bipolar disorder, unspecified: Secondary | ICD-10-CM | POA: Diagnosis not present

## 2017-02-26 DIAGNOSIS — K219 Gastro-esophageal reflux disease without esophagitis: Secondary | ICD-10-CM | POA: Diagnosis not present

## 2017-02-26 DIAGNOSIS — F17200 Nicotine dependence, unspecified, uncomplicated: Secondary | ICD-10-CM | POA: Diagnosis not present

## 2017-02-26 DIAGNOSIS — R339 Retention of urine, unspecified: Secondary | ICD-10-CM | POA: Diagnosis not present

## 2017-02-26 DIAGNOSIS — Z79899 Other long term (current) drug therapy: Secondary | ICD-10-CM | POA: Diagnosis not present

## 2017-02-26 DIAGNOSIS — T83098A Other mechanical complication of other indwelling urethral catheter, initial encounter: Secondary | ICD-10-CM | POA: Diagnosis not present

## 2017-02-27 ENCOUNTER — Encounter: Payer: Self-pay | Admitting: Gastroenterology

## 2017-02-27 DIAGNOSIS — N130 Hydronephrosis with ureteropelvic junction obstruction: Secondary | ICD-10-CM | POA: Diagnosis not present

## 2017-02-27 DIAGNOSIS — N3000 Acute cystitis without hematuria: Secondary | ICD-10-CM | POA: Diagnosis not present

## 2017-02-27 DIAGNOSIS — R8271 Bacteriuria: Secondary | ICD-10-CM | POA: Diagnosis not present

## 2017-02-27 NOTE — Progress Notes (Signed)
APPT MADE AND LETTER SENT  °

## 2017-02-27 NOTE — Progress Notes (Signed)
LMOM that she will need OV in 4 weeks and she will be contacted or mailed an appt time. Please call if questions!

## 2017-02-28 ENCOUNTER — Encounter: Payer: Self-pay | Admitting: Gastroenterology

## 2017-02-28 NOTE — Progress Notes (Signed)
Received CT from [facility] dated 02/02/17. Suspicion for pyelonephritis, distended colon full of stool which may be secondary to constipation and/or ileus. Patient has since had updated CT and sent to ED.   Colonoscopy 2016 by Dr. Britta Mccreedy: normal.

## 2017-03-07 ENCOUNTER — Telehealth: Payer: Self-pay

## 2017-03-07 DIAGNOSIS — F319 Bipolar disorder, unspecified: Secondary | ICD-10-CM | POA: Diagnosis not present

## 2017-03-07 DIAGNOSIS — F1121 Opioid dependence, in remission: Secondary | ICD-10-CM | POA: Diagnosis not present

## 2017-03-07 DIAGNOSIS — F1421 Cocaine dependence, in remission: Secondary | ICD-10-CM | POA: Diagnosis not present

## 2017-03-07 NOTE — Telephone Encounter (Signed)
She can try taking Linzess WITH food, along with Miralax once each evening.   OR:   We can have her pick up samples of Amitiza 24 mcg to take BID with food. May want to try the linzess dosing adjustment first, since she already has that. If that does not work, needs to trial Amitiza.

## 2017-03-07 NOTE — Telephone Encounter (Signed)
Per Ginger, she is getting the samples.

## 2017-03-07 NOTE — Telephone Encounter (Signed)
She has taking Linzess, along with Miralax. She will come by to get the Amitiz samples

## 2017-03-07 NOTE — Telephone Encounter (Signed)
Pt is calling to see if there is anything elsa she can take because the Linzess 290 mcg is not working. She is still not having a bowel movement. Her last bowel movement was Saturday. Please advise

## 2017-03-09 ENCOUNTER — Telehealth: Payer: Self-pay | Admitting: Gastroenterology

## 2017-03-09 NOTE — Telephone Encounter (Signed)
Pt is aware of instructions. Forwarding to Vicente Males to answer Susan's question about the appt.

## 2017-03-09 NOTE — Telephone Encounter (Signed)
At the moment there are no openings before June 4. Can I use an urgent spot?

## 2017-03-09 NOTE — Telephone Encounter (Signed)
Pt is coming tomorrow to see AB at 1030 and the June OV was cancelled

## 2017-03-09 NOTE — Telephone Encounter (Signed)
Pt called to say that she needs a prescription called to [facility] in [name]. She needs something to help her go to the bathroom. [phone]

## 2017-03-09 NOTE — Telephone Encounter (Signed)
Add Miralax BID. Take a dulcolax suppository today. Looks like she just started Amitiza 2 days ago. She needs office visit moved up.

## 2017-03-09 NOTE — Telephone Encounter (Signed)
Pt was started on Amitiza 24 mcg bid and it has not helped. She has not had a good BM since last Thursday.  Please advise!

## 2017-03-10 ENCOUNTER — Encounter: Payer: Self-pay | Admitting: Gastroenterology

## 2017-03-10 ENCOUNTER — Ambulatory Visit (INDEPENDENT_AMBULATORY_CARE_PROVIDER_SITE_OTHER): Payer: BLUE CROSS/BLUE SHIELD | Admitting: Gastroenterology

## 2017-03-10 VITALS — BP 105/75 | HR 100 | Temp 97.8°F | Ht 60.0 in | Wt 114.4 lb

## 2017-03-10 DIAGNOSIS — K5900 Constipation, unspecified: Secondary | ICD-10-CM | POA: Diagnosis not present

## 2017-03-10 DIAGNOSIS — D500 Iron deficiency anemia secondary to blood loss (chronic): Secondary | ICD-10-CM | POA: Insufficient documentation

## 2017-03-10 DIAGNOSIS — D649 Anemia, unspecified: Secondary | ICD-10-CM | POA: Diagnosis not present

## 2017-03-10 LAB — CBC WITH DIFFERENTIAL/PLATELET
BASOS ABS: 0 {cells}/uL (ref 0–200)
Basophils Relative: 0 %
EOS ABS: 490 {cells}/uL (ref 15–500)
Eosinophils Relative: 5 %
HCT: 25.9 % — ABNORMAL LOW (ref 35.0–45.0)
HEMOGLOBIN: 7.9 g/dL — AB (ref 11.7–15.5)
Lymphocytes Relative: 23 %
Lymphs Abs: 2254 cells/uL (ref 850–3900)
MCH: 26.8 pg — AB (ref 27.0–33.0)
MCHC: 30.5 g/dL — ABNORMAL LOW (ref 32.0–36.0)
MCV: 87.8 fL (ref 80.0–100.0)
MONOS PCT: 7 %
MPV: 9.1 fL (ref 7.5–12.5)
Monocytes Absolute: 686 cells/uL (ref 200–950)
NEUTROS ABS: 6370 {cells}/uL (ref 1500–7800)
NEUTROS PCT: 65 %
Platelets: 633 10*3/uL — ABNORMAL HIGH (ref 140–400)
RBC: 2.95 MIL/uL — ABNORMAL LOW (ref 3.80–5.10)
RDW: 15.6 % — ABNORMAL HIGH (ref 11.0–15.0)
WBC: 9.8 10*3/uL (ref 3.8–10.8)

## 2017-03-10 LAB — HEPATIC FUNCTION PANEL
ALBUMIN: 3 g/dL — AB (ref 3.6–5.1)
ALK PHOS: 74 U/L (ref 33–115)
ALT: 10 U/L (ref 6–29)
AST: 11 U/L (ref 10–35)
Bilirubin, Direct: 0.1 mg/dL (ref <=0.2)
Indirect Bilirubin: 0.2 mg/dL (ref 0.2–1.2)
TOTAL PROTEIN: 5.3 g/dL — AB (ref 6.1–8.1)
Total Bilirubin: 0.3 mg/dL (ref 0.2–1.2)

## 2017-03-10 LAB — IRON AND TIBC
%SAT: 5 % — ABNORMAL LOW (ref 11–50)
Iron: 17 ug/dL — ABNORMAL LOW (ref 40–190)
TIBC: 323 ug/dL (ref 250–450)
UIBC: 306 ug/dL (ref 125–400)

## 2017-03-10 LAB — TSH: TSH: 19.11 mIU/L — ABNORMAL HIGH

## 2017-03-10 MED ORDER — PANTOPRAZOLE SODIUM 40 MG PO TBEC: 40.0000 mg | DELAYED_RELEASE_TABLET | Freq: Every day | ORAL | 3 refills | Status: DC

## 2017-03-10 NOTE — Progress Notes (Signed)
Referring Provider: Redmond School, MD Primary Care Physician:  Redmond School, MD Primary GI: Dr. Oneida Alar   Chief Complaint  Patient presents with  . Abdominal Pain  . Constipation    Linzess and Amitiza didn't help    HPI:   Deanna Berry is a 46 y.o. female presenting today with a history of chronic constipation, last colonoscopy normal by Dr. Britta Mccreedy in 2016. Sent for stat CT at last visit April 26th and question of pyelonephritis, mild right hydroureteronephrosis and concern for obstructive uropathy. Persistent distended and stool-filled colon. Hgb 8.4 in ED, platelets 700.   Constipation has gotten worse over past 6-8 months. Had results with Linzess at first and ended up having fecal incontinence at night time (took one too many). She thinks that Pacheco worked for a few days but is not sure. Amitiza without any improvement. Unclear if any recent TSH on file. She had an EGD by Dr. Britta Mccreedy at some point, and she tells me she has a history of PUD.   Saw [facility]. Will be following up again in the future.   Past Medical History:  Diagnosis Date  . Bipolar disorder ([place])   . Cleft palate   . Headache(784.0)   . PUD (peptic ulcer disease)   . Sinus congestion     Past Surgical History:  Procedure Laterality Date  . CESAREAN SECTION    . COLONOSCOPY  2016   Dr. Britta Mccreedy: normal   . HERNIA REPAIR      Current Outpatient Prescriptions  Medication Sig Dispense Refill  . amitriptyline (ELAVIL) 50 MG tablet Take 50 mg by mouth at bedtime.  0  . carbamazepine (TEGRETOL XR) 200 MG 12 hr tablet Take 600 mg by mouth 2 (two) times daily.   0  . gabapentin (NEURONTIN) 800 MG tablet Take 800 mg by mouth 3 (three) times daily.  0  . pantoprazole (PROTONIX) 40 MG tablet Take 1 tablet by mouth daily.  0  . polyethylene glycol (MIRALAX / GLYCOLAX) packet Take 17 g by mouth daily.    . QUEtiapine (SEROQUEL XR) 300 MG 24 hr tablet Take 600 mg by mouth at bedtime.  0  . TRINTELLIX 20  MG TABS Take 20 mg by mouth daily.  0  . ciprofloxacin (CIPRO) 500 MG tablet Take 1 tablet (500 mg total) by mouth 2 (two) times daily. (Patient not taking: Reported on 03/10/2017) 20 tablet 0  . divalproex (DEPAKOTE ER) 500 MG 24 hr tablet Take 1 tablet (500 mg total) by mouth daily. 30 tablet 0  . gabapentin (NEURONTIN) 400 MG capsule Take 1 capsule (400 mg total) by mouth at bedtime. 30 capsule 0  . naltrexone (DEPADE) 50 MG tablet Take 50 mg by mouth daily.  0  . QUEtiapine (SEROQUEL) 400 MG tablet Take 1 tablet (400 mg total) by mouth at bedtime. 30 tablet 0  . RA COL-RITE 100 MG capsule Take 100 mg by mouth 2 (two) times daily.  0   No current facility-administered medications for this visit.     Allergies as of 03/10/2017 - Review Complete 03/10/2017  Allergen Reaction Noted  . Amoxil [amoxicillin] Hives 12/11/2012    Family History  Problem Relation Age of Onset  . Colon cancer Mother 56    Social History   Social History  . Marital status: Single    Spouse name: N/A  . Number of children: N/A  . Years of education: N/A   Occupational History  . office manager  Social History Main Topics  . Smoking status: Current Every Day Smoker    Packs/day: 1.00    Types: Cigarettes  . Smokeless tobacco: Never Used  . Alcohol use No  . 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 Sexual activity: Not Asked   Other Topics Concern  . None   Social History Narrative  . None    Review of Systems: As mentioned in HPI   Physical Exam: BP 105/75   Pulse 100   Temp 97.8 F (36.6 C) (Oral)   Ht 5' (1.524 m)   Wt 114 lb 6.4 oz (51.9 kg)   LMP 02/24/2017 (Approximate)   BMI 22.34 kg/m  General:   Alert and oriented. No distress noted. Pleasant and cooperative.  Head:  Normocephalic and atraumatic. Eyes:  Conjuctiva clear without scleral icterus. Mouth:  Oral mucosa pink and moist. Good dentition. No lesions. Abdomen:  +BS, softer and less  distended than prior exam, mild lower abdominal tenderness to palpation. No rebound or guarding. No HSM or masses noted. Msk:  Symmetrical without gross deformities. Normal posture. Extremities:  Without edema. Neurologic:  Alert and  oriented x4;  grossly normal neurologically. Psych:  Alert and cooperative. Normal mood and affect.

## 2017-03-10 NOTE — Patient Instructions (Signed)
Please have blood work done today. We will call with results next week.   Stop Amitiza. Start back taking Linzess 1 capsule each morning. On any given day that you do not have a bowel movement, take a capful of Miralax that evening.  For relief now:   Take 1 capful of Miralax WITH A FULL GLASS OF WATER every hour up to 3 doses. Repeat tomorrow if needed.  I am getting all of the records. We will need to do further evaluation due to anemia, and you may need some procedures in the near future.

## 2017-03-11 LAB — FERRITIN: Ferritin: 17 ng/mL (ref 10–232)

## 2017-03-12 NOTE — Assessment & Plan Note (Signed)
Persistent constipation, with reportedly some results with Linzess but was taking at night and resulted in incontinence. Trial of Linzess 290 mcg each morning, 30 minutes before breakfast. May have Miralax on any given day as needed. For acute relief today, take Miralax 1 capful every hour X 3 doses and repeat tomorrow if needed. Will check TSH, as I do not have this on file. Colonoscopy fairly recent from 2016 by Dr. Britta Mccreedy.

## 2017-03-12 NOTE — Assessment & Plan Note (Signed)
Hgb in 8 range while in ED. Feels fatigued. Last EGD per Dr. Britta Mccreedy unknown date. Will need to retrieve ASAP. Recheck CBC, iron, ferritin, TIBC. May need updated EGD +/- capsule study. Will retrieve outside labs to compare and ensure this is not acute on chronic anemia.

## 2017-03-13 NOTE — Progress Notes (Signed)
Pt said she is still feeling very weak. She has not had any labs done at PCP's in a long time. I will call and see if I can get copies of her most recent labs. She is aware to contact PCP about TSH and I am calling them also.

## 2017-03-13 NOTE — Progress Notes (Signed)
Received note from [facility]. Saw Dr. Matilde Sprang. From notes, patient could have had pyelonephritis but presentation was non-specific and culture was normal. Did not feel she needed to have stenting. Trial of voiding recommended. Catheter removed.

## 2017-03-13 NOTE — Progress Notes (Signed)
CC'ED TO PCP 

## 2017-03-13 NOTE — Progress Notes (Signed)
I called PCP and spoke to [facility]. Pt is scheduled an OV with Dr. Gerarda Fraction tomorrow morning, 03/14/2017 at 8:45 AM.  I called and informed pt. Per Verline Lema, pt's last labs were done in 2016 but she will fax those to [place] for Vicente Males to take a look at. Pt is aware we will let her know if Vicente Males decides to order iron infusion.

## 2017-03-13 NOTE — Progress Notes (Signed)
Doris: Ms. Priego has several things going on.   1. TSH markedly elevated. Needs to be sent to PCP ASAP. Will probably need more blood work. This could certainly explain her constipation.  2. Hgb is 7.9, down from 8.4 several weeks ago. I have no idea what her baseline is. Iron studies consistent with IDA. I have requested most recent EGD reports and blood work ASAP. Still awaiting. She has no overt GI bleeding. She is tired with exertion. Can we see how she feels today? If possible, I may just set her up for an iron infusion and avoid blood transfusion at this point. I need to know what last labs were through her PCP.

## 2017-03-14 ENCOUNTER — Other Ambulatory Visit: Payer: Self-pay

## 2017-03-14 ENCOUNTER — Encounter: Payer: Self-pay | Admitting: Gastroenterology

## 2017-03-14 DIAGNOSIS — Z1389 Encounter for screening for other disorder: Secondary | ICD-10-CM | POA: Diagnosis not present

## 2017-03-14 DIAGNOSIS — D649 Anemia, unspecified: Secondary | ICD-10-CM

## 2017-03-14 DIAGNOSIS — Z6821 Body mass index (BMI) 21.0-21.9, adult: Secondary | ICD-10-CM | POA: Diagnosis not present

## 2017-03-14 DIAGNOSIS — K219 Gastro-esophageal reflux disease without esophagitis: Secondary | ICD-10-CM | POA: Diagnosis not present

## 2017-03-14 DIAGNOSIS — F319 Bipolar disorder, unspecified: Secondary | ICD-10-CM | POA: Diagnosis not present

## 2017-03-14 NOTE — Progress Notes (Signed)
LMOM to call. Orders ready to fax for iron. Lab orders on file for 6 weeks.

## 2017-03-14 NOTE — Progress Notes (Signed)
Let's go ahead and set her up for one dose of Feraheme IV. Labs in 2016 with Hgb in 9 range. Will need to recheck CBC, iron, ferritin, TIBC in 6 weeks. She will likely be needing an endoscopy shortly, but I am waiting to see if she ever had a repeat EGD for ulcer surveillance. History of PUD.

## 2017-03-14 NOTE — Progress Notes (Addendum)
I received outside labs dated June 2016. At that time, Hgb was 9.2, Hct 27.9, Platelets 702.   I also received EGD dated May 2016. This showed esophagitis, 2 medium-sized ulcers in the antrum and at pylorus, large ulcer in duodenal bulb, hiatal hernia. I am unsure if she had a follow-up EGD. No pathology collected.   EGD then completed Aug 2016: small pyloric channel ulcer with mild pyloric stenosis, previous gastric and duodenal ulcers completely healed.   Would benefit from updated EGD +/- capsule. Patient will be coming in for a visit.

## 2017-03-15 NOTE — Progress Notes (Signed)
Pt is aware and orders have been faxed to Endo. Lab orders on file for 04/25/2017.

## 2017-03-15 NOTE — Progress Notes (Signed)
FYI to Anna Boone, NP.  

## 2017-03-17 ENCOUNTER — Encounter (HOSPITAL_COMMUNITY)
Admission: RE | Admit: 2017-03-17 | Discharge: 2017-03-17 | Disposition: A | Discharge status: Home / Self Care | Payer: BLUE CROSS/BLUE SHIELD | Source: Ambulatory Visit | Attending: Gastroenterology | Admitting: Gastroenterology

## 2017-03-17 DIAGNOSIS — D509 Iron deficiency anemia, unspecified: Secondary | ICD-10-CM | POA: Diagnosis not present

## 2017-03-17 MED ORDER — SODIUM CHLORIDE 0.9 % IV SOLN
Freq: Once | INTRAVENOUS | Status: AC
  Administered 2017-03-17: 250 mL via INTRAVENOUS

## 2017-03-17 MED ORDER — SODIUM CHLORIDE 0.9 % IV SOLN
510.0000 mg | Freq: Once | INTRAVENOUS | Status: AC
  Administered 2017-03-17: 510 mg via INTRAVENOUS
  Filled 2017-03-17: qty 17

## 2017-03-17 NOTE — Discharge Instructions (Signed)

## 2017-03-20 NOTE — Progress Notes (Signed)
PT said the last EGD she had done was 2 years ago at [facility]. Forwarding a note to Manuela Schwartz to check on that report.  Also, pt said she had the iron infusion and she still feels awful.  She wanted to let Vicente Males know to see what she recommends.

## 2017-03-20 NOTE — Progress Notes (Signed)
Requested records from [facility]. 

## 2017-03-20 NOTE — Progress Notes (Signed)
Deanna Berry: did she ever have a surveillance EGD to ensure ulcers had healed?

## 2017-03-20 NOTE — Progress Notes (Signed)
Deanna Berry, I spoke to pt about her visit to Dr. Gerarda Fraction. She said he said her thyroid was really " out of whack". He put her on medication and she has been taking it, but she is at work and she did not know the name of it.

## 2017-03-21 ENCOUNTER — Inpatient Hospital Stay (HOSPITAL_COMMUNITY)
Admission: EM | Admit: 2017-03-21 | Discharge: 2017-03-25 | DRG: 683 | Disposition: A | Discharge status: Home / Self Care | Payer: BLUE CROSS/BLUE SHIELD | Attending: Internal Medicine | Admitting: Internal Medicine

## 2017-03-21 ENCOUNTER — Emergency Department (HOSPITAL_COMMUNITY): Payer: BLUE CROSS/BLUE SHIELD

## 2017-03-21 ENCOUNTER — Encounter (HOSPITAL_COMMUNITY): Payer: Self-pay

## 2017-03-21 DIAGNOSIS — K449 Diaphragmatic hernia without obstruction or gangrene: Secondary | ICD-10-CM | POA: Diagnosis present

## 2017-03-21 DIAGNOSIS — R402142 Coma scale, eyes open, spontaneous, at arrival to emergency department: Secondary | ICD-10-CM | POA: Diagnosis present

## 2017-03-21 DIAGNOSIS — Z6821 Body mass index (BMI) 21.0-21.9, adult: Secondary | ICD-10-CM | POA: Diagnosis not present

## 2017-03-21 DIAGNOSIS — Z800 Family history of malignant neoplasm of digestive organs: Secondary | ICD-10-CM | POA: Diagnosis not present

## 2017-03-21 DIAGNOSIS — N179 Acute kidney failure, unspecified: Secondary | ICD-10-CM | POA: Diagnosis not present

## 2017-03-21 DIAGNOSIS — K5900 Constipation, unspecified: Secondary | ICD-10-CM | POA: Diagnosis not present

## 2017-03-21 DIAGNOSIS — S0990XA Unspecified injury of head, initial encounter: Secondary | ICD-10-CM | POA: Diagnosis not present

## 2017-03-21 DIAGNOSIS — R17 Unspecified jaundice: Secondary | ICD-10-CM | POA: Diagnosis present

## 2017-03-21 DIAGNOSIS — Z8711 Personal history of peptic ulcer disease: Secondary | ICD-10-CM | POA: Diagnosis not present

## 2017-03-21 DIAGNOSIS — G8929 Other chronic pain: Secondary | ICD-10-CM | POA: Diagnosis present

## 2017-03-21 DIAGNOSIS — N3000 Acute cystitis without hematuria: Secondary | ICD-10-CM | POA: Diagnosis not present

## 2017-03-21 DIAGNOSIS — E876 Hypokalemia: Secondary | ICD-10-CM | POA: Diagnosis present

## 2017-03-21 DIAGNOSIS — R270 Ataxia, unspecified: Secondary | ICD-10-CM

## 2017-03-21 DIAGNOSIS — D500 Iron deficiency anemia secondary to blood loss (chronic): Secondary | ICD-10-CM | POA: Diagnosis present

## 2017-03-21 DIAGNOSIS — Z881 Allergy status to other antibiotic agents status: Secondary | ICD-10-CM

## 2017-03-21 DIAGNOSIS — K921 Melena: Secondary | ICD-10-CM | POA: Diagnosis not present

## 2017-03-21 DIAGNOSIS — B9689 Other specified bacterial agents as the cause of diseases classified elsewhere: Secondary | ICD-10-CM | POA: Diagnosis not present

## 2017-03-21 DIAGNOSIS — Z87730 Personal history of (corrected) cleft lip and palate: Secondary | ICD-10-CM

## 2017-03-21 DIAGNOSIS — K259 Gastric ulcer, unspecified as acute or chronic, without hemorrhage or perforation: Secondary | ICD-10-CM | POA: Diagnosis not present

## 2017-03-21 DIAGNOSIS — F17210 Nicotine dependence, cigarettes, uncomplicated: Secondary | ICD-10-CM | POA: Diagnosis not present

## 2017-03-21 DIAGNOSIS — R4182 Altered mental status, unspecified: Secondary | ICD-10-CM | POA: Diagnosis present

## 2017-03-21 DIAGNOSIS — S0181XA Laceration without foreign body of other part of head, initial encounter: Secondary | ICD-10-CM | POA: Diagnosis not present

## 2017-03-21 DIAGNOSIS — D649 Anemia, unspecified: Secondary | ICD-10-CM | POA: Diagnosis not present

## 2017-03-21 DIAGNOSIS — R5383 Other fatigue: Secondary | ICD-10-CM | POA: Diagnosis not present

## 2017-03-21 DIAGNOSIS — E039 Hypothyroidism, unspecified: Secondary | ICD-10-CM | POA: Diagnosis not present

## 2017-03-21 DIAGNOSIS — W19XXXA Unspecified fall, initial encounter: Secondary | ICD-10-CM | POA: Diagnosis present

## 2017-03-21 DIAGNOSIS — R296 Repeated falls: Secondary | ICD-10-CM | POA: Diagnosis not present

## 2017-03-21 DIAGNOSIS — D509 Iron deficiency anemia, unspecified: Secondary | ICD-10-CM | POA: Diagnosis not present

## 2017-03-21 DIAGNOSIS — R402252 Coma scale, best verbal response, oriented, at arrival to emergency department: Secondary | ICD-10-CM | POA: Diagnosis present

## 2017-03-21 DIAGNOSIS — Z888 Allergy status to other drugs, medicaments and biological substances status: Secondary | ICD-10-CM | POA: Diagnosis not present

## 2017-03-21 DIAGNOSIS — F319 Bipolar disorder, unspecified: Secondary | ICD-10-CM | POA: Diagnosis not present

## 2017-03-21 DIAGNOSIS — K2950 Unspecified chronic gastritis without bleeding: Secondary | ICD-10-CM | POA: Diagnosis not present

## 2017-03-21 DIAGNOSIS — R404 Transient alteration of awareness: Secondary | ICD-10-CM | POA: Diagnosis not present

## 2017-03-21 DIAGNOSIS — S0540XA Penetrating wound of orbit with or without foreign body, unspecified eye, initial encounter: Secondary | ICD-10-CM | POA: Diagnosis not present

## 2017-03-21 DIAGNOSIS — R402362 Coma scale, best motor response, obeys commands, at arrival to emergency department: Secondary | ICD-10-CM | POA: Diagnosis present

## 2017-03-21 DIAGNOSIS — R531 Weakness: Secondary | ICD-10-CM | POA: Diagnosis not present

## 2017-03-21 HISTORY — DX: Other psychoactive substance abuse, uncomplicated: F19.10

## 2017-03-21 LAB — I-STAT TROPONIN, ED: TROPONIN I, POC: 0.01 ng/mL (ref 0.00–0.08)

## 2017-03-21 LAB — COMPREHENSIVE METABOLIC PANEL
ALBUMIN: 2.3 g/dL — AB (ref 3.5–5.0)
ALT: 11 U/L — ABNORMAL LOW (ref 14–54)
ANION GAP: 10 (ref 5–15)
AST: 16 U/L (ref 15–41)
Alkaline Phosphatase: 64 U/L (ref 38–126)
BUN: 13 mg/dL (ref 6–20)
CO2: 21 mmol/L — AB (ref 22–32)
Calcium: 8.4 mg/dL — ABNORMAL LOW (ref 8.9–10.3)
Chloride: 108 mmol/L (ref 101–111)
Creatinine, Ser: 1.93 mg/dL — ABNORMAL HIGH (ref 0.44–1.00)
GFR calc Af Amer: 35 mL/min — ABNORMAL LOW (ref >60)
GFR calc non Af Amer: 30 mL/min — ABNORMAL LOW (ref >60)
GLUCOSE: 87 mg/dL (ref 65–99)
POTASSIUM: 3 mmol/L — AB (ref 3.5–5.1)
SODIUM: 139 mmol/L (ref 135–145)
Total Bilirubin: 3.3 mg/dL — ABNORMAL HIGH (ref 0.3–1.2)
Total Protein: 5.3 g/dL — ABNORMAL LOW (ref 6.5–8.1)

## 2017-03-21 LAB — CBC WITH DIFFERENTIAL/PLATELET
BASOS ABS: 0.1 10*3/uL (ref 0.0–0.1)
BASOS PCT: 1 %
EOS ABS: 0.4 10*3/uL (ref 0.0–0.7)
Eosinophils Relative: 4 %
HEMATOCRIT: 26.3 % — AB (ref 36.0–46.0)
HEMOGLOBIN: 8.1 g/dL — AB (ref 12.0–15.0)
Lymphocytes Relative: 21 %
Lymphs Abs: 2.1 10*3/uL (ref 0.7–4.0)
MCH: 26 pg (ref 26.0–34.0)
MCHC: 30.8 g/dL (ref 30.0–36.0)
MCV: 84.6 fL (ref 78.0–100.0)
Monocytes Absolute: 1.2 10*3/uL — ABNORMAL HIGH (ref 0.1–1.0)
Monocytes Relative: 12 %
NEUTROS ABS: 6.1 10*3/uL (ref 1.7–7.7)
NEUTROS PCT: 62 %
Platelets: 442 10*3/uL — ABNORMAL HIGH (ref 150–400)
RBC: 3.11 MIL/uL — ABNORMAL LOW (ref 3.87–5.11)
RDW: 16.6 % — AB (ref 11.5–15.5)
WBC: 10 10*3/uL (ref 4.0–10.5)

## 2017-03-21 LAB — URINALYSIS, ROUTINE W REFLEX MICROSCOPIC
BILIRUBIN URINE: NEGATIVE
Glucose, UA: NEGATIVE mg/dL
Ketones, ur: NEGATIVE mg/dL
Nitrite: NEGATIVE
PH: 6 (ref 5.0–8.0)
PROTEIN: 30 mg/dL — AB
SQUAMOUS EPITHELIAL / LPF: NONE SEEN
Specific Gravity, Urine: 1.014 (ref 1.005–1.030)

## 2017-03-21 LAB — RAPID URINE DRUG SCREEN, HOSP PERFORMED
Amphetamines: NOT DETECTED
BENZODIAZEPINES: NOT DETECTED
Barbiturates: NOT DETECTED
Cocaine: NOT DETECTED
OPIATES: NOT DETECTED
Tetrahydrocannabinol: NOT DETECTED

## 2017-03-21 LAB — LIPASE, BLOOD: Lipase: 20 U/L (ref 11–51)

## 2017-03-21 LAB — CARBAMAZEPINE LEVEL, TOTAL: Carbamazepine Lvl: 12 ug/mL (ref 4.0–12.0)

## 2017-03-21 LAB — I-STAT BETA HCG BLOOD, ED (MC, WL, AP ONLY)

## 2017-03-21 MED ORDER — KETOROLAC TROMETHAMINE 30 MG/ML IJ SOLN
30.0000 mg | Freq: Once | INTRAMUSCULAR | Status: AC
  Administered 2017-03-21: 30 mg via INTRAVENOUS
  Filled 2017-03-21: qty 1

## 2017-03-21 MED ORDER — POTASSIUM CHLORIDE CRYS ER 20 MEQ PO TBCR
40.0000 meq | EXTENDED_RELEASE_TABLET | Freq: Once | ORAL | Status: AC
  Administered 2017-03-22: 40 meq via ORAL
  Filled 2017-03-21: qty 2

## 2017-03-21 MED ORDER — SODIUM CHLORIDE 0.9 % IV BOLUS (SEPSIS)
1000.0000 mL | Freq: Once | INTRAVENOUS | Status: AC
  Administered 2017-03-21: 1000 mL via INTRAVENOUS

## 2017-03-21 NOTE — ED Notes (Signed)
Asked pt for urine specimen. Pt unable to go at this time. Pt will inform when she can. 

## 2017-03-21 NOTE — ED Provider Notes (Signed)
[facility] DEPT Provider Note   CSN: [id number] Arrival date & time: 03/21/17  1648     History   Chief Complaint Chief Complaint  Patient presents with  . Altered Mental Status    HPI Deanna Berry is a 46 y.o. female who presents from behavior health with fall and reported AMS. Per EMS report patient had an episode of AMS while she was with her psychiatrist. Staff reported that patient was agitated/aggressive, difficulty sitting down, difficulties with balance. Patient states that she was trying to get up and leave when she fell and landed forward hitting her head and her right eye. Patient denies any LOC but is able to remember the event. Patient states she did have some intermittent blurry vision immediately after the fall. She reports that she sustained a left-sided head laceration and bruising to her right eye from the fall. She reports no recent illness and states that this morning she felt like she was her normal health. She reports that she has a history of intermittent dizziness that she describes as a room spinning sensation. Patient also reports a several month long history of epigastric abdominal pain that radiates to her left side and back. She denies any recent illness or fever. She denies any recent trauma prior to today's events. She is in the [facility] for a history of abuse. She states that she has been clean since 2014 and denies any recent use of illicit drugs or alcohol.  The history is provided by the patient.    Past Medical History:  Diagnosis Date  . Bipolar disorder ([place])   . Cleft palate   . Headache(784.0)   . PUD (peptic ulcer disease)   . Sinus congestion   . Substance abuse     Patient Active Problem List   Diagnosis Date Noted  . AKI (acute kidney injury) ([place]) 03/22/2017  . Hypokalemia 03/22/2017  . Hyperbilirubinemia 03/22/2017  . Ataxia 03/22/2017  . Hypothyroidism 03/22/2017  . Anemia 03/10/2017  . Constipation  03/10/2017  . Abdominal pain 02/23/2017  . Bipolar 1 disorder ([place]) 05/31/2012  . Substance abuse 05/31/2012    Past Surgical History:  Procedure Laterality Date  . CESAREAN SECTION    . COLONOSCOPY  2016   Dr. Britta Mccreedy: normal   . ESOPHAGOGASTRODUODENOSCOPY  03/2015   Dr. Britta Mccreedy: esophagitis, 2 medium-sized ulcers in antrum and pylorus, large ulcer in duodenal bulb, hiatal hernia, no specimens collected.   Marland Kitchen HERNIA REPAIR      OB History    No data available       Home Medications    Prior to Admission medications   Medication Sig Start Date End Date Taking? Authorizing Provider  carbamazepine (TEGRETOL XR) 200 MG 12 hr tablet Take 600 mg by mouth 2 (two) times daily.  02/10/17  Yes [provider]  gabapentin (NEURONTIN) 800 MG tablet Take 800 mg by mouth 3 (three) times daily. 01/30/17  Yes [provider]  linaclotide (LINZESS) 290 MCG CAPS capsule Take 290 mcg by mouth daily before breakfast.   Yes [provider]  pantoprazole (PROTONIX) 40 MG tablet Take 1 tablet (40 mg total) by mouth daily. 30 minutes before breakfast. 03/10/17  Yes Annitta Needs, NP  QUEtiapine (SEROQUEL XR) 300 MG 24 hr tablet Take 300 mg by mouth at bedtime.  01/25/17  Yes [provider]  TRINTELLIX 20 MG TABS Take 20 mg by mouth daily. 02/10/17  Yes [provider]  levothyroxine ([place], [place]) 75  MCG tablet Take 75 mcg by mouth daily. 03/21/17   [provider]    Family History Family History  Problem Relation Age of Onset  . Colon cancer Mother 46    Social History Social History  Substance Use Topics  . Smoking status: Current Every Day Smoker    Packs/day: 1.00    Types: Cigarettes  . Smokeless tobacco: Never Used  . Alcohol use No     Allergies   Amoxil [amoxicillin]; Adhesive [tape]; and Neosporin original [bacitracin-neomycin-polymyxin]   Review of Systems Review of Systems  Constitutional: Negative for chills and  fever.  HENT: Negative for congestion, rhinorrhea and sore throat.   Eyes: Negative for visual disturbance.  Respiratory: Negative for cough and shortness of breath.   Cardiovascular: Negative for chest pain.  Gastrointestinal: Positive for abdominal pain. Negative for diarrhea, nausea and vomiting.  Genitourinary: Negative for dysuria and hematuria.  Musculoskeletal: Negative for back pain and neck pain.  Skin: Negative for rash.  Neurological: Positive for dizziness. Negative for weakness, numbness and headaches.  Psychiatric/Behavioral: Negative for confusion.  All other systems reviewed and are negative.    Physical Exam Updated Vital Signs BP (!) 136/95   Pulse 82   Resp 17   Ht 5' (1.524 m)   Wt 51.7 kg (114 lb)   LMP 02/24/2017 (Approximate)   SpO2 100%   BMI 22.26 kg/m   Physical Exam  Constitutional: She is oriented to person, place, and time. She appears well-developed and well-nourished.  Sitting comfortably on examination table  HENT:  Head: Normocephalic.  Mouth/Throat: Oropharynx is clear and moist and mucous membranes are normal.  Small 2 cm superficial laceration to the left forehead just superior to the left eyebrow. Dentition intact. Patient has a cleft palate defect with repair. Tenderness to palpation to the right upper periorbital region with overlying ecchymosis or edema.  Eyes: Conjunctivae, EOM and lids are normal. Pupils are equal, round, and reactive to light.  Horizontal nystagmus to the left. No vertical nystagmus. EOMs intact without difficulty.  Neck: Full passive range of motion without pain.  Cardiovascular: Normal rate, regular rhythm, normal heart sounds and normal pulses.  Exam reveals no gallop and no friction rub.   No murmur heard. Pulmonary/Chest: Effort normal and breath sounds normal.  Abdominal: Soft. Normal appearance and bowel sounds are normal. She exhibits no distension. There is tenderness in the epigastric area and left upper  quadrant. There is CVA tenderness (left). There is no rigidity and no guarding.  Musculoskeletal: Normal range of motion.  Neurological: She is alert and oriented to person, place, and time. GCS eye subscore is 4. GCS verbal subscore is 5. GCS motor subscore is 6.  Cranial nerves III-XII intact Follows commands, Moves all extremities  5/5 strength to BUE and BLE  Sensation intact throughout  Normal finger to nose. No dysdiadochokinesia. No pronator drift. No facial droop. Patient has known speech defect secondary to pre-existing cleft palate deformity. Patient states that this is chronic denies any changes.  Skin: Skin is warm and dry. Capillary refill takes less than 2 seconds.  Psychiatric: She has a normal mood and affect. Her speech is normal.  Nursing note and vitals reviewed.    ED Treatments / Results  Labs (all labs ordered are listed, but only abnormal results are displayed) Labs Reviewed  URINALYSIS, ROUTINE W REFLEX MICROSCOPIC - Abnormal; Notable for the following:       Result Value   APPearance CLOUDY (*)  Hgb urine dipstick SMALL (*)    Protein, ur 30 (*)    Leukocytes, UA MODERATE (*)    Bacteria, UA RARE (*)    All other components within normal limits  CBC WITH DIFFERENTIAL/PLATELET - Abnormal; Notable for the following:    RBC 3.11 (*)    Hemoglobin 8.1 (*)    HCT 26.3 (*)    RDW 16.6 (*)    Platelets 442 (*)    Monocytes Absolute 1.2 (*)    All other components within normal limits  COMPREHENSIVE METABOLIC PANEL - Abnormal; Notable for the following:    Potassium 3.0 (*)    CO2 21 (*)    Creatinine, Ser 1.93 (*)    Calcium 8.4 (*)    Total Protein 5.3 (*)    Albumin 2.3 (*)    ALT 11 (*)    Total Bilirubin 3.3 (*)    GFR calc non Af Amer 30 (*)    GFR calc Af Amer 35 (*)    All other components within normal limits  URINE CULTURE  RAPID URINE DRUG SCREEN, HOSP PERFORMED  CARBAMAZEPINE LEVEL, TOTAL  LIPASE, BLOOD  ETHANOL  I-STAT BETA HCG  BLOOD, ED ([facility], [facility], [facility] ONLY)  I-STAT TROPOININ, ED    EKG  EKG Interpretation  Date/Time:  Tuesday Mar 21 2017 17:00:35 EDT Ventricular Rate:  88 PR Interval:    QRS Duration: 87 QT Interval:  362 QTC Calculation: 438 R Axis:   19 Text Interpretation:  Sinus rhythm When compared to prior, t wave inversion in lead 3 No STEMI Confirmed by Antony Blackbird [phone]) on 03/21/2017 10:43:45 PM       Radiology Dg Chest 2 View  Result Date: 03/21/2017 CLINICAL DATA:  Altered mental status.  Multiple falls today. EXAM: CHEST  2 VIEW COMPARISON:  Chest radiograph Mar 24, 2015 FINDINGS: Cardiomediastinal silhouette is normal. No pleural effusions or focal consolidations. Trachea projects midline and there is no pneumothorax. Soft tissue planes and included osseous structures are non-suspicious. IMPRESSION: Normal chest. Electronically Signed   By: Elon Alas M.D.   On: 03/21/2017 18:21   Ct Head Wo Contrast  Result Date: 03/21/2017 CLINICAL DATA:  Altered mental status. Speech change and imbalance. 2 falls. Laceration about the orbits. History of cleft palate. Initial encounter. EXAM: CT HEAD AND ORBITS WITHOUT CONTRAST TECHNIQUE: Contiguous axial images were obtained from the base of the skull through the vertex without contrast. Multidetector CT imaging of the orbits was performed using the standard protocol without intravenous contrast. COMPARISON:  Head CT 03/24/2015. FINDINGS: CT HEAD FINDINGS Brain: There is no evidence of acute infarct, intracranial hemorrhage, mass, midline shift, or extra-axial fluid collection. The ventricles and sulci are normal. Prominent extra-axial CSF space at the level of the suprasellar basilar cistern measures 3.5 x 1.5 cm and may represent an incidental arachnoid cyst, unchanged. Vascular: Minimal bilateral carotid siphon calcification. No hyperdense vessel. Skull: No fracture or focal osseous lesion. Other: None. CT ORBITS FINDINGS Orbits: The globes appear  intact. The orbital fat is normal in appearance without evidence of hematoma or mass. The extraocular muscles and optic nerve complexes are symmetric and normal in appearance. No acute fracture is identified. Visualized sinuses: Postsurgical changes in the paranasal sinuses and nasal cavity. Left greater than right maxillary sinus osteitis consistent with chronic sinusitis. Mild mucosal thickening and bubbly fluid in the right maxillary sinus. Large bilateral mastoid effusions. Right middle ear opacification. Soft tissues: Mild right periorbital soft tissue swelling. IMPRESSION: 1. No  evidence of acute intracranial abnormality. 2. Mild right periorbital soft tissue swelling without evidence of acute orbital fracture. 3. Chronic sinusitis with possible superimposed acute right maxillary sinusitis. 4. Large mastoid effusions with right middle ear opacification. Electronically Signed   By: Logan Bores M.D.   On: 03/21/2017 18:46   Ct Orbits Wo Contrast  Result Date: 03/21/2017 CLINICAL DATA:  Altered mental status. Speech change and imbalance. 2 falls. Laceration about the orbits. History of cleft palate. Initial encounter. EXAM: CT HEAD AND ORBITS WITHOUT CONTRAST TECHNIQUE: Contiguous axial images were obtained from the base of the skull through the vertex without contrast. Multidetector CT imaging of the orbits was performed using the standard protocol without intravenous contrast. COMPARISON:  Head CT 03/24/2015. FINDINGS: CT HEAD FINDINGS Brain: There is no evidence of acute infarct, intracranial hemorrhage, mass, midline shift, or extra-axial fluid collection. The ventricles and sulci are normal. Prominent extra-axial CSF space at the level of the suprasellar basilar cistern measures 3.5 x 1.5 cm and may represent an incidental arachnoid cyst, unchanged. Vascular: Minimal bilateral carotid siphon calcification. No hyperdense vessel. Skull: No fracture or focal osseous lesion. Other: None. CT ORBITS FINDINGS  Orbits: The globes appear intact. The orbital fat is normal in appearance without evidence of hematoma or mass. The extraocular muscles and optic nerve complexes are symmetric and normal in appearance. No acute fracture is identified. Visualized sinuses: Postsurgical changes in the paranasal sinuses and nasal cavity. Left greater than right maxillary sinus osteitis consistent with chronic sinusitis. Mild mucosal thickening and bubbly fluid in the right maxillary sinus. Large bilateral mastoid effusions. Right middle ear opacification. Soft tissues: Mild right periorbital soft tissue swelling. IMPRESSION: 1. No evidence of acute intracranial abnormality. 2. Mild right periorbital soft tissue swelling without evidence of acute orbital fracture. 3. Chronic sinusitis with possible superimposed acute right maxillary sinusitis. 4. Large mastoid effusions with right middle ear opacification. Electronically Signed   By: Logan Bores M.D.   On: 03/21/2017 18:46    Procedures Procedures (including critical care time)  Medications Ordered in ED Medications  0.9 % NaCl with KCl 20 mEq/ L  infusion (not administered)  LORazepam (ATIVAN) tablet 1 mg (not administered)  ketorolac (TORADOL) 30 MG/ML injection 30 mg (30 mg Intravenous Given 03/21/17 1831)  sodium chloride 0.9 % bolus 1,000 mL (0 mLs Intravenous Stopped 03/21/17 2205)  potassium chloride SA (K-DUR,KLOR-CON) CR tablet 40 mEq (40 mEq Oral Given 03/22/17 0003)     Initial Impression / Assessment and Plan / ED Course  I have reviewed the triage vital signs and the nursing notes.  Pertinent labs & imaging results that were available during my care of the patient were reviewed by me and considered in my medical decision making (see chart for details).     46 year old female who presents from a [facility] via EMS with reports of AMS and fall. Per patient she states that she was trying to leave her psychiatrist office when she came  agitated and had some AMS, causing her to fall forward. She denies any LOC. Patient is afebrile, non-toxic appearing, sitting comfortably on examination table. Throughout interview she is complaining of some diffuse epigastric and left upper quadrant tenderness. Abdominal exam shows some mild tenderness palpation. Will plan to obtain lipase and CMP for evaluation. Will hold on CT abdomen/pelvis at this time. Will check basic labs including CBC, UA, UDS, EKG, troponin, chest x-ray. Will obtain CT head to evaluate for any acute ICH or skull fracture.  Obtain CT orbit to evaluate for any orbital wall fracture. Analgesics provided in the department.   Labs and imaging reviewed. CT head negative for any acute intracranial pathology. No orbital wall fracture seen on CT orbit. This x-ray negative for any acute infectious etiology. EKG normal sinus rhythm at 88. Initial i-STAT troponin is 0.01. I-STAT beta negative. Lipase and urine analysis and urine drug screen negative. [facility] called ED and was requesting a Tegretol level. That was added. It is still pending.  Labs reviewed. CBC with signs of low H&H at 8.1 and 26.3. Records reviewed and appearconsistent with previous. 11 days ago she had any change of symptoms 7.9 and 25.9. Initial troponin 0.01. I-STAT beta negative. Tegretol level is within normal limits.  10:17 PM: Per ED tech,Patient is able to ambulatory by herself in the department without any difficulty. Urine is still pending.   UA reviewed. Patient has small amount of hemoglobin, moderate leukocytes and bacteria present concerning for an acute UTI. Discussed with patient. She is not on her menstrual cycle at this time. Re-evaluation of patient shows she has some left-sided CVA tenderness. CMP and lipase are pending. Wound care provided in the department. Reevaluation of small laceration to the right forehead shows that it is very superficial and does not need any sutures or Dermabond at  this time.  CMP and lipase were canceled. Discussed with lab and they had told the nurse that the original samples had hemolyzed. Repeat samples were sent down but they had not come back yet. Lab states that they should be returning shortly.  CMP and lipase resulted. Lipase within normal limits. CMP shows a acute elevation of her creatinine at 1.93. Compared to 2 weeks ago she was 1.38. CMP also shows hypokalemia with potassium 3.0. Patient provided oral potassium replacements in the department.   Given given history of altered mental status at behavioral [facility], acute UTI and acute AKI patient would likely benefit from admission for IV antibiotics and a recent pyelonephritis.  Consult hospitalist place.  Discussed with hospitalist. Will plan to admit for further evaluation.    Final Clinical Impressions(s) / ED Diagnoses   Final diagnoses:  Acute kidney injury ([place])  Acute cystitis without hematuria    New Prescriptions New Prescriptions   No medications on file     Desma Mcgregor 03/22/17 0147    Tegeler, Gwenyth Allegra, MD 03/22/17 1113

## 2017-03-21 NOTE — ED Notes (Signed)
Pt ambulated self efficiently with no difficulty. 

## 2017-03-21 NOTE — ED Notes (Signed)
Dermabond at bedside.  

## 2017-03-21 NOTE — ED Triage Notes (Signed)
Pt. Coming [facility] for altered mental status acutely today. Pt. Had sudden onset of not being able to sit still, change in speech, and loss of balance. Before EMS got there the patient fell 2x while trying to get UDS. Pt. Noted to have lac over left and right eye. Bruising to right eye as well. EMS also reports pt. Has nystagmus. Pt. Hx of cleft palate, so pt. Has abnormal speech at baseline. Pt. Hx of substance abuse, but has been clean since 2014. Pt. Aox4.

## 2017-03-21 NOTE — ED Notes (Signed)
Pt on bedpan, attempting to urinate.

## 2017-03-21 NOTE — ED Notes (Signed)
Pt found out of bed, standing at bedside with IV mostly removed & bleeding. Redirected to remain in bed d/t prior syncopal episode, and to call for assistance. Removed IV and bandaged site.

## 2017-03-22 ENCOUNTER — Inpatient Hospital Stay (HOSPITAL_COMMUNITY): Payer: BLUE CROSS/BLUE SHIELD

## 2017-03-22 ENCOUNTER — Encounter: Payer: Self-pay | Admitting: Gastroenterology

## 2017-03-22 ENCOUNTER — Encounter (HOSPITAL_COMMUNITY): Payer: Self-pay | Admitting: Family Medicine

## 2017-03-22 DIAGNOSIS — Z881 Allergy status to other antibiotic agents status: Secondary | ICD-10-CM | POA: Diagnosis not present

## 2017-03-22 DIAGNOSIS — Z800 Family history of malignant neoplasm of digestive organs: Secondary | ICD-10-CM | POA: Diagnosis not present

## 2017-03-22 DIAGNOSIS — E039 Hypothyroidism, unspecified: Secondary | ICD-10-CM | POA: Diagnosis not present

## 2017-03-22 DIAGNOSIS — K921 Melena: Secondary | ICD-10-CM | POA: Diagnosis not present

## 2017-03-22 DIAGNOSIS — R402362 Coma scale, best motor response, obeys commands, at arrival to emergency department: Secondary | ICD-10-CM | POA: Diagnosis present

## 2017-03-22 DIAGNOSIS — R402142 Coma scale, eyes open, spontaneous, at arrival to emergency department: Secondary | ICD-10-CM | POA: Diagnosis present

## 2017-03-22 DIAGNOSIS — D509 Iron deficiency anemia, unspecified: Secondary | ICD-10-CM | POA: Diagnosis present

## 2017-03-22 DIAGNOSIS — N179 Acute kidney failure, unspecified: Secondary | ICD-10-CM | POA: Diagnosis present

## 2017-03-22 DIAGNOSIS — F319 Bipolar disorder, unspecified: Secondary | ICD-10-CM | POA: Diagnosis not present

## 2017-03-22 DIAGNOSIS — W19XXXA Unspecified fall, initial encounter: Secondary | ICD-10-CM | POA: Diagnosis present

## 2017-03-22 DIAGNOSIS — D649 Anemia, unspecified: Secondary | ICD-10-CM | POA: Diagnosis not present

## 2017-03-22 DIAGNOSIS — K259 Gastric ulcer, unspecified as acute or chronic, without hemorrhage or perforation: Secondary | ICD-10-CM | POA: Diagnosis not present

## 2017-03-22 DIAGNOSIS — K2950 Unspecified chronic gastritis without bleeding: Secondary | ICD-10-CM | POA: Diagnosis not present

## 2017-03-22 DIAGNOSIS — G8929 Other chronic pain: Secondary | ICD-10-CM | POA: Diagnosis present

## 2017-03-22 DIAGNOSIS — R402252 Coma scale, best verbal response, oriented, at arrival to emergency department: Secondary | ICD-10-CM | POA: Diagnosis present

## 2017-03-22 DIAGNOSIS — Z8711 Personal history of peptic ulcer disease: Secondary | ICD-10-CM | POA: Diagnosis not present

## 2017-03-22 DIAGNOSIS — F17210 Nicotine dependence, cigarettes, uncomplicated: Secondary | ICD-10-CM | POA: Diagnosis present

## 2017-03-22 DIAGNOSIS — E876 Hypokalemia: Secondary | ICD-10-CM

## 2017-03-22 DIAGNOSIS — S0181XA Laceration without foreign body of other part of head, initial encounter: Secondary | ICD-10-CM | POA: Diagnosis present

## 2017-03-22 DIAGNOSIS — N3000 Acute cystitis without hematuria: Secondary | ICD-10-CM

## 2017-03-22 DIAGNOSIS — R17 Unspecified jaundice: Secondary | ICD-10-CM | POA: Diagnosis present

## 2017-03-22 DIAGNOSIS — Z888 Allergy status to other drugs, medicaments and biological substances status: Secondary | ICD-10-CM | POA: Diagnosis not present

## 2017-03-22 DIAGNOSIS — R4182 Altered mental status, unspecified: Secondary | ICD-10-CM | POA: Diagnosis present

## 2017-03-22 DIAGNOSIS — R270 Ataxia, unspecified: Secondary | ICD-10-CM | POA: Diagnosis not present

## 2017-03-22 DIAGNOSIS — K5900 Constipation, unspecified: Secondary | ICD-10-CM | POA: Diagnosis not present

## 2017-03-22 DIAGNOSIS — D500 Iron deficiency anemia secondary to blood loss (chronic): Secondary | ICD-10-CM | POA: Diagnosis not present

## 2017-03-22 DIAGNOSIS — Z87730 Personal history of (corrected) cleft lip and palate: Secondary | ICD-10-CM | POA: Diagnosis not present

## 2017-03-22 DIAGNOSIS — S0990XA Unspecified injury of head, initial encounter: Secondary | ICD-10-CM | POA: Diagnosis not present

## 2017-03-22 DIAGNOSIS — K449 Diaphragmatic hernia without obstruction or gangrene: Secondary | ICD-10-CM | POA: Diagnosis present

## 2017-03-22 LAB — COMPREHENSIVE METABOLIC PANEL
ALK PHOS: 58 U/L (ref 38–126)
ALT: 10 U/L — AB (ref 14–54)
ANION GAP: 9 (ref 5–15)
AST: 17 U/L (ref 15–41)
Albumin: 2.2 g/dL — ABNORMAL LOW (ref 3.5–5.0)
BILIRUBIN TOTAL: 2.8 mg/dL — AB (ref 0.3–1.2)
BUN: 11 mg/dL (ref 6–20)
CALCIUM: 8 mg/dL — AB (ref 8.9–10.3)
CO2: 20 mmol/L — AB (ref 22–32)
CREATININE: 1.78 mg/dL — AB (ref 0.44–1.00)
Chloride: 110 mmol/L (ref 101–111)
GFR, EST AFRICAN AMERICAN: 38 mL/min — AB (ref >60)
GFR, EST NON AFRICAN AMERICAN: 33 mL/min — AB (ref >60)
Glucose, Bld: 105 mg/dL — ABNORMAL HIGH (ref 65–99)
Potassium: 2.9 mmol/L — ABNORMAL LOW (ref 3.5–5.1)
SODIUM: 139 mmol/L (ref 135–145)
TOTAL PROTEIN: 5 g/dL — AB (ref 6.5–8.1)

## 2017-03-22 LAB — CBC
HEMATOCRIT: 26 % — AB (ref 36.0–46.0)
HEMOGLOBIN: 7.8 g/dL — AB (ref 12.0–15.0)
MCH: 25.4 pg — AB (ref 26.0–34.0)
MCHC: 30 g/dL (ref 30.0–36.0)
MCV: 84.7 fL (ref 78.0–100.0)
Platelets: 431 10*3/uL — ABNORMAL HIGH (ref 150–400)
RBC: 3.07 MIL/uL — AB (ref 3.87–5.11)
RDW: 16.2 % — ABNORMAL HIGH (ref 11.5–15.5)
WBC: 10.3 10*3/uL (ref 4.0–10.5)

## 2017-03-22 LAB — SODIUM, URINE, RANDOM: Sodium, Ur: 93 mmol/L

## 2017-03-22 LAB — ETHANOL: Alcohol, Ethyl (B): <5 mg/dL (ref <5)

## 2017-03-22 LAB — PROTEIN / CREATININE RATIO, URINE
Creatinine, Urine: 42.34 mg/dL
Protein Creatinine Ratio: 0.8 mg/mg{Cre} — ABNORMAL HIGH (ref 0.00–0.15)
Total Protein, Urine: 34 mg/dL

## 2017-03-22 LAB — HIV ANTIBODY (ROUTINE TESTING W REFLEX): HIV Screen 4th Generation wRfx: NONREACTIVE

## 2017-03-22 LAB — MAGNESIUM: Magnesium: 1.9 mg/dL (ref 1.7–2.4)

## 2017-03-22 LAB — CREATININE, URINE, RANDOM: CREATININE, URINE: 42.2 mg/dL

## 2017-03-22 MED ORDER — GABAPENTIN 800 MG PO TABS
800.0000 mg | ORAL_TABLET | Freq: Three times a day (TID) | ORAL | Status: DC
  Filled 2017-03-22: qty 1

## 2017-03-22 MED ORDER — ONDANSETRON HCL 4 MG/2ML IJ SOLN: 4.0000 mg | Freq: Four times a day (QID) | INTRAMUSCULAR | Status: DC | PRN

## 2017-03-22 MED ORDER — POTASSIUM CHLORIDE IN NACL 20-0.9 MEQ/L-% IV SOLN
INTRAVENOUS | Status: DC
  Administered 2017-03-22: 03:00:00 via INTRAVENOUS
  Filled 2017-03-22 (×2): qty 1000

## 2017-03-22 MED ORDER — LEVOTHYROXINE SODIUM 75 MCG PO TABS
75.0000 ug | ORAL_TABLET | Freq: Every day | ORAL | Status: DC
  Administered 2017-03-22 – 2017-03-25 (×4): 75 ug via ORAL
  Filled 2017-03-22 (×4): qty 1

## 2017-03-22 MED ORDER — TRAMADOL HCL 50 MG PO TABS
50.0000 mg | ORAL_TABLET | Freq: Four times a day (QID) | ORAL | Status: DC | PRN
  Administered 2017-03-22 – 2017-03-24 (×9): 50 mg via ORAL
  Filled 2017-03-22 (×9): qty 1

## 2017-03-22 MED ORDER — FERROUS SULFATE 325 (65 FE) MG PO TABS
325.0000 mg | ORAL_TABLET | ORAL | Status: DC
  Administered 2017-03-22: 325 mg via ORAL
  Filled 2017-03-22: qty 1

## 2017-03-22 MED ORDER — POTASSIUM CHLORIDE IN NACL 40-0.9 MEQ/L-% IV SOLN
INTRAVENOUS | Status: DC
  Administered 2017-03-22 – 2017-03-23 (×2): 100 mL/h via INTRAVENOUS
  Filled 2017-03-22 (×3): qty 1000

## 2017-03-22 MED ORDER — PANTOPRAZOLE SODIUM 40 MG PO TBEC
40.0000 mg | DELAYED_RELEASE_TABLET | Freq: Every day | ORAL | Status: DC
  Administered 2017-03-22 – 2017-03-23 (×2): 40 mg via ORAL
  Filled 2017-03-22 (×2): qty 1

## 2017-03-22 MED ORDER — SODIUM CHLORIDE 0.9 % IV BOLUS (SEPSIS): 500.0000 mL | Freq: Once | INTRAVENOUS | Status: DC

## 2017-03-22 MED ORDER — LINACLOTIDE 145 MCG PO CAPS
290.0000 ug | ORAL_CAPSULE | Freq: Every day | ORAL | Status: DC
  Administered 2017-03-22 – 2017-03-25 (×4): 290 ug via ORAL
  Filled 2017-03-22 (×4): qty 2

## 2017-03-22 MED ORDER — QUETIAPINE FUMARATE ER 300 MG PO TB24
300.0000 mg | ORAL_TABLET | Freq: Every day | ORAL | Status: DC
  Administered 2017-03-22 – 2017-03-24 (×3): 300 mg via ORAL
  Filled 2017-03-22 (×3): qty 1

## 2017-03-22 MED ORDER — CARBAMAZEPINE ER 200 MG PO TB12
600.0000 mg | ORAL_TABLET | Freq: Two times a day (BID) | ORAL | Status: DC
  Administered 2017-03-22 – 2017-03-25 (×7): 600 mg via ORAL
  Filled 2017-03-22 (×7): qty 3

## 2017-03-22 MED ORDER — POTASSIUM CHLORIDE CRYS ER 20 MEQ PO TBCR
40.0000 meq | EXTENDED_RELEASE_TABLET | Freq: Two times a day (BID) | ORAL | Status: DC
  Administered 2017-03-22 (×2): 40 meq via ORAL
  Filled 2017-03-22 (×2): qty 2

## 2017-03-22 MED ORDER — GABAPENTIN 400 MG PO CAPS
800.0000 mg | ORAL_CAPSULE | Freq: Three times a day (TID) | ORAL | Status: DC
  Administered 2017-03-22 – 2017-03-25 (×10): 800 mg via ORAL
  Filled 2017-03-22 (×2): qty 2
  Filled 2017-03-22: qty 8
  Filled 2017-03-22 (×8): qty 2

## 2017-03-22 MED ORDER — ACETAMINOPHEN 650 MG RE SUPP: 650.0000 mg | Freq: Four times a day (QID) | RECTAL | Status: DC | PRN

## 2017-03-22 MED ORDER — ONDANSETRON HCL 4 MG PO TABS: 4.0000 mg | ORAL_TABLET | Freq: Four times a day (QID) | ORAL | Status: DC | PRN

## 2017-03-22 MED ORDER — VORTIOXETINE HBR 20 MG PO TABS
20.0000 mg | ORAL_TABLET | Freq: Every day | ORAL | Status: DC
  Administered 2017-03-22 – 2017-03-25 (×4): 20 mg via ORAL
  Filled 2017-03-22 (×4): qty 20

## 2017-03-22 MED ORDER — LORAZEPAM 1 MG PO TABS
1.0000 mg | ORAL_TABLET | Freq: Once | ORAL | Status: AC | PRN
  Administered 2017-03-22: 1 mg via ORAL
  Filled 2017-03-22: qty 1

## 2017-03-22 MED ORDER — ACETAMINOPHEN 325 MG PO TABS: 650.0000 mg | ORAL_TABLET | Freq: Four times a day (QID) | ORAL | Status: DC | PRN

## 2017-03-22 MED ORDER — ENOXAPARIN SODIUM 30 MG/0.3ML ~~LOC~~ SOLN
30.0000 mg | SUBCUTANEOUS | Status: DC
  Administered 2017-03-22: 30 mg via SUBCUTANEOUS
  Filled 2017-03-22: qty 0.3

## 2017-03-22 NOTE — Progress Notes (Signed)
Thanks

## 2017-03-22 NOTE — Progress Notes (Signed)
Vicente Males, did I forward this note to you?

## 2017-03-22 NOTE — Progress Notes (Signed)
Deanna Berry: you did. She has been admitted to the hospital for other issues. I was waiting on EGD reports. I am going to addend my last office note. We need to have her come and see me when she is out of the hospital.

## 2017-03-22 NOTE — H&P (Signed)
History and Physical  Patient Name: Deanna Berry     JAS:[id number]    DOB: 1971/05/31    DOA: 03/21/2017 PCP: Redmond School, MD   Patient coming from: Psychiatrist's office  Chief Complaint: Fall, imbalance  HPI: Deanna Berry is a 46 y.o. female with a past medical history significant for bipolar disorder who presents with fall.  The patient was initially sent over from Psychiatrist's office because of fall.  She reports feeling her normal self today, going to her regular appointment, concluding the appointment as usual and then when she went to stand up, she felt "off-balance" and fell over, striking her left eye on the ground.  She didn't feel blood rushing to her head or nausea or sweaty; staff helped her up and she lay in the waiting room for a while, felt like "my eyes were jumping".  There was no slurred speech, focal weakness, numbness, vision loss, confusion, LOC.  At that point, her psychiartist asked for a UDS, she started to walk to the bathroom, but then fell again, this time striking her head on the other side, and so 9-1-1 were called.    ED course: -Heart rate 89, respirations 21 and pulse ox normal, BP 138/95 -Na 139, K 3.0, Cr 1.93 (baseline 1.4), WBC 10K, Hgb 8.1 (baseline around 8 recently, currently being worked up by GI) -CXR clear -CT head and orbits showed no fractures and no acute intracranial process, only some mastoid effusions -UDS clear, troponin normal -Tegretol level within therapeutic range -Orthostatics were not checked -She was given IV fluids and supplemental K -Toradol was given before TRH were paged for admission for AKI   She now claims to have had numerous episodes of feeling isolated "off balance" over the last two months, about every 1-2 weeks, brief, lasting minutes to hours only, resolving spontaneously.    Also, of note, she was seen by her GI a month ago for abdominal pain, sent for outpatient CT of the abdomen and pelvis that showed incidental  asymmetric renal enhancement consistent with pyelonephritis, hydronephrosis on the RIGHT, and ?soft tissue enhancement at the RIGHT UVJ (she had diffuse crampy abdominal pain only, perhaps dark urine but no dysuria, urinary urgency, flank pain, nausea, vomiting).  At the time, she was evaluated in the ER, had pyuria and urinary retention, but no leukocytosis or fever.  Had a foley placed and was referred to Urology, was prescribed ciprofloxacin and her pain and dark urine resolved.  She has been taking Aleve daily recently for pain.        ROS: Review of Systems  Constitutional: Positive for malaise/fatigue (fatigue, over weeks). Negative for chills and fever.  HENT: Negative for nosebleeds.   Eyes: Negative for blurred vision.  Respiratory: Negative for cough, hemoptysis, sputum production and shortness of breath.   Cardiovascular: Negative for chest pain.  Gastrointestinal: Positive for abdominal pain. Negative for blood in stool, constipation, diarrhea, melena, nausea and vomiting.  Genitourinary: Positive for flank pain (bilaterally). Negative for dysuria, frequency, hematuria and urgency.  Neurological: Negative for dizziness, tingling, tremors, sensory change, speech change, focal weakness, seizures, loss of consciousness, weakness and headaches.       Ataxia  All other systems reviewed and are negative.         Past Medical History:  Diagnosis Date  . Bipolar disorder ([place])   . Cleft palate   . Headache(784.0)   . PUD (peptic ulcer disease)   . Sinus congestion   . Substance abuse  Past Surgical History:  Procedure Laterality Date  . CESAREAN SECTION    . COLONOSCOPY  2016   Dr. Britta Mccreedy: normal   . ESOPHAGOGASTRODUODENOSCOPY  03/2015   Dr. Britta Mccreedy: esophagitis, 2 medium-sized ulcers in antrum and pylorus, large ulcer in duodenal bulb, hiatal hernia, no specimens collected.   Marland Kitchen HERNIA REPAIR      Social History: Patient lives with her son.  The patient walks  uanssisted.  She smokes.  She does not use alcohol or drugs.  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  Allergies  Allergen Reactions  . Amoxil [Amoxicillin] Hives  . Adhesive [Tape] Rash  . Neosporin Original [Bacitracin-Neomycin-Polymyxin] Rash    Family history: family history includes Colon cancer (age of onset: 55) in her mother.  Prior to Admission medications   Medication Sig Start Date End Date Taking? Authorizing Provider  carbamazepine (TEGRETOL XR) 200 MG 12 hr tablet Take 600 mg by mouth 2 (two) times daily.  02/10/17  Yes [provider]  gabapentin (NEURONTIN) 800 MG tablet Take 800 mg by mouth 3 (three) times daily. 01/30/17  Yes [provider]  linaclotide (LINZESS) 290 MCG CAPS capsule Take 290 mcg by mouth daily before breakfast.   Yes [provider]  pantoprazole (PROTONIX) 40 MG tablet Take 1 tablet (40 mg total) by mouth daily. 30 minutes before breakfast. 03/10/17  Yes Annitta Needs, NP  QUEtiapine (SEROQUEL XR) 300 MG 24 hr tablet Take 300 mg by mouth at bedtime.  01/25/17  Yes [provider]  TRINTELLIX 20 MG TABS Take 20 mg by mouth daily. 02/10/17  Yes [provider]  levothyroxine (SYNTHROID, LEVOTHROID) 75 MCG tablet Take 75 mcg by mouth daily. 03/21/17   [provider]       Physical Exam: BP (!) 136/95   Pulse 82   Resp 17   Ht 5' (1.524 m)   Wt 51.7 kg (114 lb)   LMP 02/24/2017 (Approximate)   SpO2 100%   BMI 22.26 kg/m  General appearance: Well-developed, adult female, alert and in no acute distress.   Eyes: Anicteric, conjunctiva pink, lids and lashes normal. PERRL.    ENT: No nasal deformity, discharge, epistaxis.  Hearing normal. OP moist without lesions.  Old cleft palate, hard palate defect noted. Neck: No neck masses.  Trachea midline.  No thyromegaly/tenderness. Lymph: No cervical or supraclavicular lymphadenopathy. Skin: Warm and dry.  No jaundice.  No suspicious rashes or  lesions. Cardiac: RRR, nl S1-S2, no murmurs appreciated.  Capillary refill is brisk.  JVP normal.  No LE edema.  Radial and DP pulses 2+ and symmetric. Respiratory: Normal respiratory rate and rhythm.  CTAB without rales or wheezes. Abdomen: Abdomen soft.  No  TTP. No ascites, distension, hepatosplenomegaly.   MSK: No deformities or effusions.  No cyanosis or clubbing. Neuro: Pupils are 3 mm and reactive to 2 mm.  Extraocular movements are intact, without nystagmus.  Cranial nerve 5 is within normal limits.  Cranial nerve 7 is symmetrical.  Cranial nerve 8 is within normal limits.  Cranial nerves 9 and 10 reveal equal palate elevation.  Cranial nerve 11 reveals sternocleidomastoid strong.  Cranial nerve 12 is midline.  Motor strength testing is 5/5 in the upper and lower extremities bilaterally with normal motor, tone and bulk. Sensory examination is intact to light touch.  No pronator drift.  Finger-to-nose testing is symmetric. The patient is oriented to time, place and person.  Speech is fluent.  Naming is  grossly intact.  Recall, recent and remote, as well as general fund of knowledge seem within normal limits.  Attention span and concentration are within normal limits.     Psych: Sensorium intact and responding to questions, attention normal.  Behavior appropriate.  Affect odd.  Judgment and insight appear normal.     Labs on Admission:  I have personally reviewed following labs and imaging studies: CBC:  Recent Labs Lab 03/21/17 1836  WBC 10.0  NEUTROABS 6.1  HGB 8.1*  HCT 26.3*  MCV 84.6  PLT 213*   Basic Metabolic Panel:  Recent Labs Lab 03/21/17 1940  NA 139  K 3.0*  CL 108  CO2 21*  GLUCOSE 87  BUN 13  CREATININE 1.93*  CALCIUM 8.4*   GFR: Estimated Creatinine Clearance: 26.2 mL/min (A) (by C-G formula based on SCr of 1.93 mg/dL (H)).  Liver Function Tests:  Recent Labs Lab 03/21/17 1940  AST 16  ALT 11*  ALKPHOS 64  BILITOT 3.3*  PROT 5.3*  ALBUMIN 2.3*      Recent Labs Lab 03/21/17 1940  LIPASE 20   No results for input(s): AMMONIA in the last 168 hours. Coagulation Profile: No results for input(s): INR, PROTIME in the last 168 hours. Cardiac Enzymes: No results for input(s): CKTOTAL, CKMB, CKMBINDEX, TROPONINI in the last 168 hours. BNP (last 3 results) No results for input(s): PROBNP in the last 8760 hours. HbA1C: No results for input(s): HGBA1C in the last 72 hours. CBG: No results for input(s): GLUCAP in the last 168 hours. Lipid Profile: No results for input(s): CHOL, HDL, LDLCALC, TRIG, CHOLHDL, LDLDIRECT in the last 72 hours. Thyroid Function Tests: No results for input(s): TSH, T4TOTAL, FREET4, T3FREE, THYROIDAB in the last 72 hours. Anemia Panel: No results for input(s): VITAMINB12, FOLATE, FERRITIN, TIBC, IRON, RETICCTPCT in the last 72 hours. Sepsis Labs: Invalid input(s): PROCALCITONIN, LACTICIDVEN No results found for this or any previous visit (from the past 240 hour(s)).       Radiological Exams on Admission: Personally reviewed CXR shows no focal opacity; CT head report reviewed: Dg Chest 2 View  Result Date: 03/21/2017 CLINICAL DATA:  Altered mental status.  Multiple falls today. EXAM: CHEST  2 VIEW COMPARISON:  Chest radiograph Mar 24, 2015 FINDINGS: Cardiomediastinal silhouette is normal. No pleural effusions or focal consolidations. Trachea projects midline and there is no pneumothorax. Soft tissue planes and included osseous structures are non-suspicious. IMPRESSION: Normal chest. Electronically Signed   By: Elon Alas M.D.   On: 03/21/2017 18:21   Ct Head Wo Contrast  Result Date: 03/21/2017 CLINICAL DATA:  Altered mental status. Speech change and imbalance. 2 falls. Laceration about the orbits. History of cleft palate. Initial encounter. EXAM: CT HEAD AND ORBITS WITHOUT CONTRAST TECHNIQUE: Contiguous axial images were obtained from the base of the skull through the vertex without contrast.  Multidetector CT imaging of the orbits was performed using the standard protocol without intravenous contrast. COMPARISON:  Head CT 03/24/2015. FINDINGS: CT HEAD FINDINGS Brain: There is no evidence of acute infarct, intracranial hemorrhage, mass, midline shift, or extra-axial fluid collection. The ventricles and sulci are normal. Prominent extra-axial CSF space at the level of the suprasellar basilar cistern measures 3.5 x 1.5 cm and may represent an incidental arachnoid cyst, unchanged. Vascular: Minimal bilateral carotid siphon calcification. No hyperdense vessel. Skull: No fracture or focal osseous lesion. Other: None. CT ORBITS FINDINGS Orbits: The globes appear intact. The orbital fat is normal in appearance without evidence of hematoma or mass.  The extraocular muscles and optic nerve complexes are symmetric and normal in appearance. No acute fracture is identified. Visualized sinuses: Postsurgical changes in the paranasal sinuses and nasal cavity. Left greater than right maxillary sinus osteitis consistent with chronic sinusitis. Mild mucosal thickening and bubbly fluid in the right maxillary sinus. Large bilateral mastoid effusions. Right middle ear opacification. Soft tissues: Mild right periorbital soft tissue swelling. IMPRESSION: 1. No evidence of acute intracranial abnormality. 2. Mild right periorbital soft tissue swelling without evidence of acute orbital fracture. 3. Chronic sinusitis with possible superimposed acute right maxillary sinusitis. 4. Large mastoid effusions with right middle ear opacification. Electronically Signed   By: Logan Bores M.D.   On: 03/21/2017 18:46   Ct Orbits Wo Contrast  Result Date: 03/21/2017 CLINICAL DATA:  Altered mental status. Speech change and imbalance. 2 falls. Laceration about the orbits. History of cleft palate. Initial encounter. EXAM: CT HEAD AND ORBITS WITHOUT CONTRAST TECHNIQUE: Contiguous axial images were obtained from the base of the skull through the  vertex without contrast. Multidetector CT imaging of the orbits was performed using the standard protocol without intravenous contrast. COMPARISON:  Head CT 03/24/2015. FINDINGS: CT HEAD FINDINGS Brain: There is no evidence of acute infarct, intracranial hemorrhage, mass, midline shift, or extra-axial fluid collection. The ventricles and sulci are normal. Prominent extra-axial CSF space at the level of the suprasellar basilar cistern measures 3.5 x 1.5 cm and may represent an incidental arachnoid cyst, unchanged. Vascular: Minimal bilateral carotid siphon calcification. No hyperdense vessel. Skull: No fracture or focal osseous lesion. Other: None. CT ORBITS FINDINGS Orbits: The globes appear intact. The orbital fat is normal in appearance without evidence of hematoma or mass. The extraocular muscles and optic nerve complexes are symmetric and normal in appearance. No acute fracture is identified. Visualized sinuses: Postsurgical changes in the paranasal sinuses and nasal cavity. Left greater than right maxillary sinus osteitis consistent with chronic sinusitis. Mild mucosal thickening and bubbly fluid in the right maxillary sinus. Large bilateral mastoid effusions. Right middle ear opacification. Soft tissues: Mild right periorbital soft tissue swelling. IMPRESSION: 1. No evidence of acute intracranial abnormality. 2. Mild right periorbital soft tissue swelling without evidence of acute orbital fracture. 3. Chronic sinusitis with possible superimposed acute right maxillary sinusitis. 4. Large mastoid effusions with right middle ear opacification. Electronically Signed   By: Logan Bores M.D.   On: 03/21/2017 18:46            Assessment/Plan  1. Acute kidney injury:  In setting of NSAID use for chronic pain.   -Counseled to avoid NSAIDs -Fluids -Check FeNA -Check renal US -Fluids and trend SCr    2. Imbalance/ataxia:  Suspect this is side effect from psychiatric medications in setting of AKI.   Differential includes cerebellar ischemia, but episodic pattern over last 2 months is odd.   -Check EtOH -MR brain ordered  3. Hypokalemia:  Mild. -Check Mag -MIVF with K  4. Elevated bilirubin:  Has been fluctuating.  CT showed dilated GB last month.  Currently abdomen exam benign. -Fractionate bilirubin and trend  5. Anemia, normocytic:  Iron deficient at labs drawn this month.  Got Feraheme at GI office.  No report of melena, hematemesis, hematochezia. -Iron every other day  -Miralax for constipation -Follow up with GI for EGD as planned  6. Hypothyroidism:  Recently started on levothyroxine -Continue levothyroxine  7. Hydronephrosis on CT:  UA shows pyuria and crystals. -Culture urine -Renal US ordered -If hydronephrosis persistent, may need reimaging of  right UVJ soft tissue mass sen on last CT and/or Urology referral     DVT prophylaxis: Lovenox  Code Status: FULL  Family Communication: None present  Disposition Plan: Anticipate IV fluids and trend Cr and obtain kidney imaging. Consults called: None Admission status: INPATIENT         Medical decision making: Patient seen at 12:20 AM on 03/22/2017.  The patient was discussed with Providence Lanius, PA_C.  What exists of the patient's chart was reviewed in depth and summarized above.  Clinical condition: stable.        Edwin Dada [facility] Pager [phone]

## 2017-03-22 NOTE — Progress Notes (Signed)
Patient seen and examined  Deanna Berry is a 46 y.o. female with a past medical history significant for bipolar disorder who presents with fall. Found to have Na 139, K 3.0, Cr 1.93 (baseline 1.4), WBC 10K, Hgb 8.1 . Blood pressure soft  Assessment and plan 1. Acute kidney injury:  In setting of NSAID use for chronic pain.   versus poor oral intake for several days -Counseled to avoid NSAIDs -Continue fluids -Check FeNA -Renal ultrasound unremarkable Follow renal function, improving 1.93>1.78    2. Imbalance/ataxia:  Suspect this is side effect from psychiatric medications in setting of AKI.  Differential includes cerebellar ischemia, but episodic pattern over last 2 months is odd.   -Telemetry uneventful, carbamazepine level within normal limits, negative EtOH, UDS negative Check orthostatics -MR brain negative  3. Hypokalemia:  Replete potassium, magnesium 1.9  4. Elevated bilirubin:  Has been fluctuating.  CT showed dilated GB last month.  Currently abdomen exam benign. Ultrasound benign -   5. Anemia, normocytic:  Iron deficient at labs drawn this month.  Got Feraheme at GI office.  No report of melena, hematemesis, hematochezia. -Iron every other day  -Miralax for constipation Patient would need outpatient EGD, will continue patient on PPI, follow CBC closely  6. Hypothyroidism:  Recently started on levothyroxine -Continue levothyroxine  7. Hydronephrosis on CT:  UA shows pyuria and crystals. -Culture urine -Renal US  within normal limits

## 2017-03-22 NOTE — Progress Notes (Signed)
Admitted patient a 46 yr old Hispanic female from home to ED . Alert and oriented x 4 follow command approprietly. Pt transferred to ed with steady gait. Oriented to room ,nursing staff unit and use of call light safety measures discussed with pt and  Pt verbalized understanding. Will continue to monitor pt  Closely as pt is a high risk for fall

## 2017-03-22 NOTE — Progress Notes (Addendum)
Pt lost IV site earlier today right after Dr Allyson Sabal ordered an IVF NS bolus.  Poor IV access, had to have IV team to come restart IV.  Pt orthos done (see graphic flowsheet), IV now placed and IVF restarted. BP 120'S, some drop in SBP with standing no symptoms. ? If pt needs bolus IVF ordered earlier, contacted Dr Allyson Sabal.  Orders received to cancel bolus.

## 2017-03-22 NOTE — Progress Notes (Signed)
Pt went down for Ultrasound .

## 2017-03-22 NOTE — Evaluation (Signed)
Physical Therapy Evaluation Patient Details Name: Deanna Berry MRN: [id number] DOB: 09/14/1971 Today's Date: 03/22/2017   History of Present Illness  46 y.o. female admitted to [facility] on 03/21/17 for fall, imbalance.  Pt dx with acute kidney injury, imbalance/ataxia MRI negative for acute findings, hypokalemia, elevated billirubin, anemia (chronic) normocytic (supplementing with iron), and hydronephrosis on CT scan.  UDS negative. Pt with singificant PMhx of substance abuse, PUD, HA, cleft palate, and bipolar d/o.  Clinical Impression  Pt with much more than anticipated balance deficits with gait into the hallway, staggering pattern both to the right and to the left requiring min guard assist (without challenges).  I did complete a thorough vestibular assessment which was completely negative.  She reports feeling generally weak and fatigued.  PT will follow acutely and recommend OP balance therapy.     Follow Up Recommendations Outpatient PT    Equipment Recommendations  None recommended by PT    Recommendations for Other Services   NA    Precautions / Restrictions Precautions Precautions: Fall Precaution Comments: recent h/o falls      Mobility  Bed Mobility Overal bed mobility: Modified Independent                Transfers Overall transfer level: Needs assistance Equipment used: None Transfers: Sit to/from Stand Sit to Stand: Supervision         General transfer comment: supervision for safety, educated on resting between positions to make sure she is not dizzy before proceeding to standing from sitting and to walking from standing.   Ambulation/Gait Ambulation/Gait assistance: Min guard Ambulation Distance (Feet): 150 Feet Assistive device: None (tried both with none and IV pole) Gait Pattern/deviations: Step-through pattern;Staggering left;Staggering right Gait velocity: decreased Gait velocity interpretation: Below normal speed for age/gender General Gait Details:  Pt with staggering gait pattern, she is not very aware that she is doing it.  She can't say clearly if this is baseline or not.  I did not challenge her with head turns and she staggered both right and left both with and without holding the IV pole.           Balance Overall balance assessment: Needs assistance Sitting-balance support: No upper extremity supported;Feet supported Sitting balance-Leahy Scale: Good Sitting balance - Comments: can donn bil socks seated EOB.    Standing balance support: Single extremity supported Standing balance-Leahy Scale: Good Standing balance comment: statically she is good, dynamically she requires light assist         03/22/17 1745  Vestibular Assessment  General Observation No eye alignment issues, wears glasses to read, has migraines, but none currently, has head trauma x 2 recently, right ear grossly less hearing than left (pt believes this is not new), no recent antibiotic use, no tinnitus or fullness in the ears.   Symptom Behavior  Type of Dizziness Imbalance  Frequency of Dizziness episodic  Aggravating Factors No known aggravating factors  Relieving Factors Comments (no known)  Occulomotor Exam  Occulomotor Alignment Normal  Spontaneous Absent  Gaze-induced Absent  Smooth Pursuits Intact  Saccades Intact  Vestibulo-Occular Reflex  VOR 1 Head Only (x 1 viewing) normal speed and no symptoms both vertically and horizontally.   Auditory  Comments grossly less in right ear  Positional Testing  Dix-Hallpike Dix-Hallpike Right;Dix-Hallpike Left  Horizontal Canal Testing Horizontal Canal Right;Horizontal Canal Left  Dix-Hallpike Right  Dix-Hallpike Right Duration 0  Dix-Hallpike Right Symptoms No nystagmus  Dix-Hallpike Left  Dix-Hallpike Left Duration 0  Dix-Hallpike Left Symptoms  No nystagmus  Horizontal Canal Right  Horizontal Canal Right Duration 0  Horizontal Canal Right Symptoms Normal  Horizontal Canal Left  Horizontal Canal  Left Duration 0  Horizontal Canal Left Symptoms Normal  Orthostatics  Orthostatics Comment see vitals flow sheet, RN staff took it earlier.                           Pertinent Vitals/Pain Pain Assessment: No/denies pain    Home Living Family/patient expects to be discharged to:: Private residence Living Arrangements: Children (84 y.o. son) Available Help at Discharge: Family;Available PRN/intermittently Type of Home: House       Home Layout: Two level Home Equipment: Cane - single point      Prior Function Level of Independence: Independent         Comments: works as an Environmental consultant   Dominant Hand: Right    Extremity/Trunk Assessment   Upper Extremity Assessment Upper Extremity Assessment: Overall WFL for tasks assessed    Lower Extremity Assessment Lower Extremity Assessment: Overall WFL for tasks assessed    Cervical / Trunk Assessment Cervical / Trunk Assessment: Normal  Communication   Communication: Other (comment) (cleft palate, so a speech impediment. )  Cognition Arousal/Alertness: Awake/alert Behavior During Therapy: WFL for tasks assessed/performed Overall Cognitive Status: Within Functional Limits for tasks assessed                                        General Comments General comments (skin integrity, edema, etc.): Pt reports general fatigue/weakness, has not slept much in the past 24 hours either.          Assessment/Plan    PT Assessment Patient needs continued PT services  PT Problem List Decreased balance;Decreased mobility;Decreased knowledge of use of DME       PT Treatment Interventions DME instruction;Gait training;Stair training;Functional mobility training;Therapeutic activities;Therapeutic exercise;Balance training;Patient/family education;Neuromuscular re-education    PT Goals (Current goals can be found in the Care Plan section)  Acute Rehab PT Goals Patient Stated Goal:  to figure out why these episodes keep happening.  PT Goal Formulation: With patient Time For Goal Achievement: 04/05/17 Potential to Achieve Goals: Good    Frequency Min 3X/week    AM-PAC PT "6 Clicks" Daily Activity  Outcome Measure Difficulty turning over in bed (including adjusting bedclothes, sheets and blankets)?: None Difficulty moving from lying on back to sitting on the side of the bed? : None Difficulty sitting down on and standing up from a chair with arms (e.g., wheelchair, bedside commode, etc,.)?: None Help needed moving to and from a bed to chair (including a wheelchair)?: None Help needed walking in hospital room?: A Little Help needed climbing 3-5 steps with a railing? : A Little 6 Click Score: 22    End of Session Equipment Utilized During Treatment: Gait belt Activity Tolerance: Patient limited by fatigue Patient left: in bed;with call bell/phone within reach   PT Visit Diagnosis: Unsteadiness on feet (R26.81);Difficulty in walking, not elsewhere classified (R26.2)    Time: 1751-0258 PT Time Calculation (min) (ACUTE ONLY): 39 min   Charges:         Wells Guiles B. Rejina Odle, PT, DPT [phone]   PT Evaluation $PT Eval Moderate Complexity: 1 Procedure PT Treatments $Gait Training: 8-22 mins $Therapeutic Activity: 8-22 mins   03/22/2017, 5:44 PM

## 2017-03-23 DIAGNOSIS — D500 Iron deficiency anemia secondary to blood loss (chronic): Secondary | ICD-10-CM

## 2017-03-23 DIAGNOSIS — F319 Bipolar disorder, unspecified: Secondary | ICD-10-CM

## 2017-03-23 LAB — COMPREHENSIVE METABOLIC PANEL
ALK PHOS: 49 U/L (ref 38–126)
ALT: 9 U/L — ABNORMAL LOW (ref 14–54)
AST: 14 U/L — AB (ref 15–41)
Albumin: 1.7 g/dL — ABNORMAL LOW (ref 3.5–5.0)
Anion gap: 4 — ABNORMAL LOW (ref 5–15)
BILIRUBIN TOTAL: 1.9 mg/dL — AB (ref 0.3–1.2)
BUN: 9 mg/dL (ref 6–20)
CALCIUM: 7.5 mg/dL — AB (ref 8.9–10.3)
CO2: 20 mmol/L — AB (ref 22–32)
Chloride: 119 mmol/L — ABNORMAL HIGH (ref 101–111)
Creatinine, Ser: 1.35 mg/dL — ABNORMAL HIGH (ref 0.44–1.00)
GFR calc Af Amer: 54 mL/min — ABNORMAL LOW (ref >60)
GFR calc non Af Amer: 46 mL/min — ABNORMAL LOW (ref >60)
GLUCOSE: 81 mg/dL (ref 65–99)
POTASSIUM: 5.5 mmol/L — AB (ref 3.5–5.1)
SODIUM: 143 mmol/L (ref 135–145)
Total Protein: 4.1 g/dL — ABNORMAL LOW (ref 6.5–8.1)

## 2017-03-23 LAB — CBC
HEMATOCRIT: 22.9 % — AB (ref 36.0–46.0)
Hemoglobin: 6.9 g/dL — CL (ref 12.0–15.0)
MCH: 26.1 pg (ref 26.0–34.0)
MCHC: 30.1 g/dL (ref 30.0–36.0)
MCV: 86.7 fL (ref 78.0–100.0)
Platelets: 344 10*3/uL (ref 150–400)
RBC: 2.64 MIL/uL — ABNORMAL LOW (ref 3.87–5.11)
RDW: 16.7 % — AB (ref 11.5–15.5)
WBC: 10.4 10*3/uL (ref 4.0–10.5)

## 2017-03-23 LAB — LACTATE DEHYDROGENASE: LDH: 312 U/L — ABNORMAL HIGH (ref 98–192)

## 2017-03-23 LAB — ABO/RH: ABO/RH(D): A POS

## 2017-03-23 LAB — URINE CULTURE

## 2017-03-23 LAB — PREPARE RBC (CROSSMATCH)

## 2017-03-23 LAB — SAVE SMEAR

## 2017-03-23 MED ORDER — ENOXAPARIN SODIUM 40 MG/0.4ML ~~LOC~~ SOLN: 40.0000 mg | SUBCUTANEOUS | Status: DC

## 2017-03-23 MED ORDER — SODIUM CHLORIDE 0.9 % IV SOLN
510.0000 mg | Freq: Once | INTRAVENOUS | Status: AC
  Administered 2017-03-23: 510 mg via INTRAVENOUS
  Filled 2017-03-23: qty 17

## 2017-03-23 MED ORDER — PANTOPRAZOLE SODIUM 40 MG PO TBEC
40.0000 mg | DELAYED_RELEASE_TABLET | Freq: Two times a day (BID) | ORAL | Status: DC
  Administered 2017-03-23 – 2017-03-24 (×2): 40 mg via ORAL
  Filled 2017-03-23 (×2): qty 1

## 2017-03-23 MED ORDER — SODIUM CHLORIDE 0.9 % IV SOLN
Freq: Once | INTRAVENOUS | Status: AC
  Administered 2017-03-23: 14:00:00 via INTRAVENOUS

## 2017-03-23 MED ORDER — PANTOPRAZOLE SODIUM 40 MG PO TBEC: 40.0000 mg | DELAYED_RELEASE_TABLET | Freq: Two times a day (BID) | ORAL | Status: DC

## 2017-03-23 NOTE — Progress Notes (Signed)
CM made outpatient PT and OT referral: [place] [phone] [place] [phone] [place] [id number]    Next Steps: Follow up    Instructions: Outpatiet PT and OT arranged , office will call to set up appointments    CM made pt aware.  Whitman Hero RN,BSN,CM

## 2017-03-23 NOTE — Evaluation (Signed)
Occupational Therapy Evaluation Patient Details Name: Deanna Berry MRN: [id number] DOB: 07-Mar-1971 Today's Date: 03/23/2017    History of Present Illness 46 y.o. female admitted to [facility] on 03/21/17 for fall, imbalance.  Pt dx with acute kidney injury, imbalance/ataxia MRI negative for acute findings, hypokalemia, elevated billirubin, anemia (chronic) normocytic (supplementing with iron), and hydronephrosis on CT scan.  UDS negative. Pt with singificant PMhx of substance abuse, PUD, HA, cleft palate, and bipolar d/o.   Clinical Impression   PTA Pt independent in ADL/IADL and mobility. Pt currently min guard for ADL and mobility. Pt with 6.8 Hgb today, and planning to get blood transfusion later today. Per pt she will go for an upper GI study tomorrow to see if the source of the bleeding can be found (has h/o bleeding ulcers).  Pt continues to be significantly fatigued and unbalanced during ADL so session was focused on energy conservation (handout provided) and OT and Pt talked at length about the importance of planning her day when she has the most energy. Pt will continue to benefit from skilled OT in the acute setting to maximize safety and independence in ADL and functional transfers as well as continue energy conservation education. Next session to focus on education for tub transfer and use of 3 in 1 as tub/shower chair.     Follow Up Recommendations  Outpatient OT;Supervision - Intermittent    Equipment Recommendations  3 in 1 bedside commode    Recommendations for Other Services       Precautions / Restrictions Precautions Precautions: Fall Precaution Comments: recent h/o falls Restrictions Weight Bearing Restrictions: No      Mobility Bed Mobility Overal bed mobility: Independent                Transfers Overall transfer level: Needs assistance Equipment used: None Transfers: Sit to/from Stand Sit to Stand: Supervision         General transfer comment: supervision  for safety. Pt reports general fatigue, no lightheadedness.    Balance Overall balance assessment: Needs assistance Sitting-balance support: Feet supported;No upper extremity supported Sitting balance-Leahy Scale: Normal Sitting balance - Comments: able to don/doff socks   Standing balance support: No upper extremity supported Standing balance-Leahy Scale: Good Standing balance comment: sit to stand from bed only due to fatigue - no challenges to balance this session                           ADL either performed or assessed with clinical judgement   ADL Overall ADL's : Needs assistance/impaired Eating/Feeding: Modified independent;Sitting   Grooming: Min guard;Standing Grooming Details (indicate cue type and reason): energy conservation strategies introduced Upper Body Bathing: Set up;Sitting   Lower Body Bathing: Set up;Sitting/lateral leans   Upper Body Dressing : Set up;Sitting   Lower Body Dressing: Min guard;Sit to/from stand   Toilet Transfer: Supervision/safety;BSC   Toileting- Water quality scientist and Hygiene: Supervision/safety;Sit to/from stand   Tub/ Shower Transfer: Min guard;Tub transfer;3 in 1 Tub/Shower Transfer Details (indicate cue type and reason): verbally reviewed process of using 3 in 1 as shower chair - Pt would benefit from handout Functional mobility during ADLs: Min guard (sit to stand only this session due to fatigue) General ADL Comments: Energy conservation handout provided and reviewed with Pt - Pt works as an Glass blower/designer so focused on ADL and tasks that would apply in office setting. Pt needs continued reinforcement of these strategies     Vision Baseline  Vision/History: Wears glasses Wears Glasses: Reading only Patient Visual Report: No change from baseline Vision Assessment?: No apparent visual deficits Additional Comments: no glasses in the room, Pt able to read handout provided by OT on energy conservation     Perception      Praxis      Pertinent Vitals/Pain Pain Assessment: No/denies pain     Hand Dominance Right   Extremity/Trunk Assessment         Cervical / Trunk Assessment Cervical / Trunk Assessment: Normal   Communication Communication Communication: Other (comment) (cleft palate, so a speech impediment. )   Cognition Arousal/Alertness: Awake/alert Behavior During Therapy: WFL for tasks assessed/performed Overall Cognitive Status: Within Functional Limits for tasks assessed                                     General Comments  Pt very pleasant and willing to work with therapy. She expressed such gratitude to find a doctor who listened to her, and is hopeful about answers and feeling better again    Exercises     Shoulder Instructions      Home Living Family/patient expects to be discharged to:: Private residence Living Arrangements: Children (17 year old son) Available Help at Discharge: Family;Available PRN/intermittently Type of Home: House       Home Layout: Two level Alternate Level Stairs-Number of Steps: flight   Bathroom Shower/Tub: Teacher, early years/pre: Standard Bathroom Accessibility: Yes How Accessible: Accessible via walker Home Equipment: Cane - single point          Prior Functioning/Environment Level of Independence: Independent        Comments: works as an Glass blower/designer, drives        OT Problem List: Decreased activity tolerance;Impaired balance (sitting and/or standing);Decreased knowledge of use of DME or AE      OT Treatment/Interventions: Self-care/ADL training;Therapeutic exercise;Energy conservation;DME and/or AE instruction;Therapeutic activities;Patient/family education;Balance training    OT Goals(Current goals can be found in the care plan section) Acute Rehab OT Goals Patient Stated Goal: to figure out why these episodes keep happening.  OT Goal Formulation: With patient Time For Goal Achievement:  04/06/17 Potential to Achieve Goals: Good ADL Goals Pt Will Perform Grooming: with modified independence (implementing energy conservation strategies with  <1 vc) Pt Will Perform Upper Body Bathing: with modified independence;sitting;with adaptive equipment Pt Will Perform Lower Body Bathing: with modified independence;with adaptive equipment;sit to/from stand Pt Will Transfer to Toilet: with modified independence;ambulating;regular height toilet Pt Will Perform Toileting - Clothing Manipulation and hygiene: with modified independence;sit to/from stand Pt Will Perform Tub/Shower Transfer: Tub transfer;with modified independence;ambulating;3 in 1 Additional ADL Goal #1: Pt will recall 3 ways to 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) with less than 2 verbal cues  OT Frequency: Min 2X/week   Barriers to D/C: Decreased caregiver support (Pt lives/is caregiver for 40 y/o son)          Co-evaluation              AM-PAC PT "6 Clicks" Daily Activity     Outcome Measure Help from another person eating meals?: None Help from another person taking care of personal grooming?: A Little Help from another person toileting, which includes using toliet, bedpan, or urinal?: A Little Help from another person bathing (including washing, rinsing, drying)?: A Little Help from another person to put on and taking off regular  upper body clothing?: None Help from another person to put on and taking off regular lower body clothing?: A Little 6 Click Score: 20   End of Session Equipment Utilized During Treatment: Gait belt Nurse Communication: Mobility status;Other (comment) (infusion complete)  Activity Tolerance: Patient tolerated treatment well;Other (comment) (Pt fatigued from session with PT, and waiting on blood) Patient left: in bed;with call bell/phone within reach;with bed alarm set  OT Visit Diagnosis: Unsteadiness on feet (R26.81);History of falling (Z91.81);Muscle weakness  (generalized) (M62.81)                Time: 5053-9767 OT Time Calculation (min): 25 min Charges:  OT General Charges $OT Visit: 1 Procedure OT Evaluation $OT Eval Moderate Complexity: 1 Procedure OT Treatments $Self Care/Home Management : 8-22 mins G-Codes:     Hulda Humphrey OTR/L [place] 03/23/2017, 2:37 PM

## 2017-03-23 NOTE — Progress Notes (Signed)
[facility] PROGRESS NOTE  Deanna Berry JXB:[id number] DOB: Nov 12, 1970 DOA: 03/21/2017   PCP: Redmond School, MD     Assessment/Plan: Principal Problem:   AKI (acute kidney injury) ([place]) Active Problems:   Bipolar 1 disorder ([place])   Anemia   Constipation   Hypokalemia   Hyperbilirubinemia   Ataxia   Hypothyroidism   Acute cystitis without hematuria   Deanna Berry is a 46 y.o. female with a past medical history significant for bipolar disorder who presents with fall.  The patient was initially sent over from Psychiatrist's office because of fall.  She reports feeling her normal self today, going to her regular appointment, concluding the appointment as usual and then when she went to stand up, she felt "off-balance" and fell over.Found to have Na 139, K 3.0, Cr 1.93(baseline 1.4), WBC 10K, Hgb 8.1 . Blood pressure soft. Patient admits to poor oral intake  Assessment and plan 1. Acute kidney injury: In setting of NSAID use for chronic pain. versus poor oral intake for several days -Counseled to avoid NSAIDs  Renal function has improved, likely prerenal, DC IV fluids -Renal ultrasound unremarkable Follow renal function, improving 1.93>1.78>1.35    2. Imbalance/ataxia: Suspect this is side effect from psychiatric medications in setting of AKI.  Versus worsening anemia Telemetry uneventful, carbamazepine level within normal limits, negative EtOH, UDS negative Check orthostatics  MR brain negative  3. Hypokalemia: Potassium repleted, magnesium 1.9  4. Elevated bilirubin:3.3>19 CT showed dilated GB last month. Currently abdomen exam benign. Ultrasound benign -   5. Anemia, normocytic: Iron deficient at labs drawn this month. Got Feraheme at GI office. Will repeat No report of melena, hematemesis, hematochezia. Hemoglobin has trended down from 8.1-6.9, check FOBT  EGD dated May 2016. This showed esophagitis, 2 medium-sized ulcers in the antrum  and at pylorus, large ulcer in duodenal bulb, hiatal hernia [facility] consulted for repeat EGD? Patient is unassigned has a GI physician in [place] EGD 5/25  6. Hypothyroidism: Recently started on levothyroxine -Continue levothyroxine  7. Hydronephrosis on CT: UA shows pyuria and crystals. Urine culture no growth so far Renal US  within normal limits    DVT prophylaxsis SCDs, Lovenox discontinued due to worsening anemia  Code Status:  Full code    Family Communication: Discussed in detail with the patient, all imaging results, lab results explained to the patient   Disposition Plan:  GI consultation      Consultants:  Gastroenterology  Procedures:  None  Antibiotics: Anti-infectives    None         HPI/Subjective: Denies any epigastric pain , melena, no cp, no sob  Objective: Vitals:   03/22/17 1346 03/22/17 1506 03/22/17 2220 03/23/17 0516  BP: 116/77 120/73  101/73  Pulse: 89 79  93  Resp:    16  Temp:  97.6 F (36.4 C) 98 F (36.7 C) 98.2 F (36.8 C)  TempSrc:  Oral  Oral  SpO2:  100%  95%  Weight:      Height:        Intake/Output Summary (Last 24 hours) at 03/23/17 0850 Last data filed at 03/23/17 0522  Gross per 24 hour  Intake          1444.67 ml  Output             1450 ml  Net            -5.33 ml    Exam:  Examination:  General exam:  Appears calm and comfortable  Respiratory system: Clear to auscultation. Respiratory effort normal. Cardiovascular system: S1 & S2 heard, RRR. No JVD, murmurs, rubs, gallops or clicks. No pedal edema. Gastrointestinal system: Abdomen is nondistended, soft and nontender. No organomegaly or masses felt. Normal bowel sounds heard. Central nervous system: Alert and oriented. No focal neurological deficits. Extremities: Symmetric 5 x 5 power. Skin: No rashes, lesions or ulcers Psychiatry: Judgement and insight appear normal. Mood & affect appropriate.     Data Reviewed: I have personally  reviewed following labs and imaging studies  Micro Results No results found for this or any previous visit (from the past 240 hour(s)).  Radiology Reports Dg Chest 2 View  Result Date: 03/21/2017 CLINICAL DATA:  Altered mental status.  Multiple falls today. EXAM: CHEST  2 VIEW COMPARISON:  Chest radiograph Mar 24, 2015 FINDINGS: Cardiomediastinal silhouette is normal. No pleural effusions or focal consolidations. Trachea projects midline and there is no pneumothorax. Soft tissue planes and included osseous structures are non-suspicious. IMPRESSION: Normal chest. Electronically Signed   By: Elon Alas M.D.   On: 03/21/2017 18:21   Ct Head Wo Contrast  Result Date: 03/21/2017 CLINICAL DATA:  Altered mental status. Speech change and imbalance. 2 falls. Laceration about the orbits. History of cleft palate. Initial encounter. EXAM: CT HEAD AND ORBITS WITHOUT CONTRAST TECHNIQUE: Contiguous axial images were obtained from the base of the skull through the vertex without contrast. Multidetector CT imaging of the orbits was performed using the standard protocol without intravenous contrast. COMPARISON:  Head CT 03/24/2015. FINDINGS: CT HEAD FINDINGS Brain: There is no evidence of acute infarct, intracranial hemorrhage, mass, midline shift, or extra-axial fluid collection. The ventricles and sulci are normal. Prominent extra-axial CSF space at the level of the suprasellar basilar cistern measures 3.5 x 1.5 cm and may represent an incidental arachnoid cyst, unchanged. Vascular: Minimal bilateral carotid siphon calcification. No hyperdense vessel. Skull: No fracture or focal osseous lesion. Other: None. CT ORBITS FINDINGS Orbits: The globes appear intact. The orbital fat is normal in appearance without evidence of hematoma or mass. The extraocular muscles and optic nerve complexes are symmetric and normal in appearance. No acute fracture is identified. Visualized sinuses: Postsurgical changes in the paranasal  sinuses and nasal cavity. Left greater than right maxillary sinus osteitis consistent with chronic sinusitis. Mild mucosal thickening and bubbly fluid in the right maxillary sinus. Large bilateral mastoid effusions. Right middle ear opacification. Soft tissues: Mild right periorbital soft tissue swelling. IMPRESSION: 1. No evidence of acute intracranial abnormality. 2. Mild right periorbital soft tissue swelling without evidence of acute orbital fracture. 3. Chronic sinusitis with possible superimposed acute right maxillary sinusitis. 4. Large mastoid effusions with right middle ear opacification. Electronically Signed   By: Logan Bores M.D.   On: 03/21/2017 18:46   Mr Brain Wo Contrast  Result Date: 03/22/2017 CLINICAL DATA:  Initial evaluation for acute fall. EXAM: MRI HEAD WITHOUT CONTRAST TECHNIQUE: Multiplanar, multiecho pulse sequences of the brain and surrounding structures were obtained without intravenous contrast. COMPARISON:  None.  Prior CT from 03/21/2017. FINDINGS: Brain: Cerebral volume within normal limits for age. No focal parenchymal signal abnormality identified. No significant cerebral white matter disease. No abnormal foci of restricted diffusion to suggest acute or subacute ischemia. Gray-white matter differentiation maintained. No areas of chronic infarction. No evidence for acute or chronic intracranial hemorrhage. No mass lesion, midline shift, or mass effect. No hydrocephalus. Benign arachnoid cyst at the superior cerebellar cistern noted with mild mass effect on  the subjacent cerebellar vermis. No other extra-axial fluid collection. Major dural sinuses are grossly patent. Pituitary gland suprasellar region within normal limits. Midline structures intact and normal. Vascular: Major intracranial vascular flow voids are maintained. Skull and upper cervical spine: Craniocervical junction normal. Visualized upper cervical spine unremarkable. Bone marrow signal intensity within normal  limits. Possible trace right periorbital contusion noted. Scalp soft tissues otherwise unremarkable. Sinuses/Orbits: Globes and orbital soft tissues within normal limits. Mucosal thickening within the maxillary sinuses, likely chronic. Paranasal sinuses are otherwise clear. Bilateral mastoid effusions. Other: None. IMPRESSION: 1. Negative brain MRI.  No acute intracranial process identified. 2. Chronic maxillary sinusitis. 3. Bilateral mastoid effusions. Electronically Signed   By: Jeannine Boga M.D.   On: 03/22/2017 05:50   US Abdomen Complete  Result Date: 03/22/2017 CLINICAL DATA:  Smoker, acute kidney injury, elevated total bilirubin, history substance abuse EXAM: ABDOMEN ULTRASOUND COMPLETE COMPARISON:  CT abdomen and pelvis 02/23/2017 FINDINGS: Gallbladder: Distended, at least 10.2 cm length and 4.8 cm transverse. No definite gallstones, wall thickening, pericholecystic fluid or sonographic Murphy sign. Common bile duct: Diameter: 5 mm diameter, normal Liver: Normal appearance IVC: Normal appearance Pancreas: Normal appearance Spleen: Normal appearance, 5.1 cm length Right Kidney: Length: 10.1 cm. Normal morphology without mass or hydronephrosis. Left Kidney: Length: 10.0 cm. Normal morphology without mass or hydronephrosis. Abdominal aorta: Visualized portion normal caliber, distal aorta and bifurcation obscured by bowel gas Other findings: No free fluid IMPRESSION: Distended gallbladder without stones or wall thickening. Otherwise negative exam. Electronically Signed   By: Lavonia Dana M.D.   On: 03/22/2017 08:15   Ct Abdomen Pelvis W Contrast  Result Date: 02/23/2017 CLINICAL DATA:  Worsening constipation with abdominal distention and tenderness. Nausea and vomiting this morning. EXAM: CT ABDOMEN AND PELVIS WITH CONTRAST TECHNIQUE: Multidetector CT imaging of the abdomen and pelvis was performed using the standard protocol following bolus administration of intravenous contrast. CONTRAST:  11mL  ISOVUE-300 IOPAMIDOL (ISOVUE-300) INJECTION 61% COMPARISON:  02/02/2017 FINDINGS: Lower chest:  Unremarkable. Hepatobiliary: No focal abnormality within the liver parenchyma. Mild intrahepatic biliary duct distention is stable. Marked distention of the gallbladder is stable. No substantial extrahepatic biliary duct dilatation. Pancreas: No focal mass lesion. No dilatation of the main duct. No intraparenchymal cyst. No peripancreatic edema. Spleen: No splenomegaly. No focal mass lesion. Adrenals/Urinary Tract: No adrenal nodule or mass. Cortical thinning noted in the kidneys bilaterally with areas of segmental a decreased cortical perfusion bilaterally. This appearance can be seen in the setting of pyelonephritis. There is mild right hydroureteronephrosis, new in the interval with distention of the right ureter down to the distal right ureter/right UVJ where asymmetric soft tissue attenuation/ enhancement is identified when compared to the left. Stomach/Bowel: Stomach is nondistended. No gastric wall thickening. No evidence of outlet obstruction. Duodenum is normally positioned as is the ligament of Treitz. No small bowel wall thickening. No small bowel dilatation. The terminal ileum is normal. The appendix is normal. Colon is prominently stool distended throughout. Vascular/Lymphatic: There is abdominal aortic atherosclerosis without aneurysm. Scattered small lymph nodes identified in the root of the small bowel mesentery. No retroperitoneal lymphadenopathy. No pelvic sidewall lymphadenopathy. Reproductive: The uterus has normal CT imaging appearance. There is no adnexal mass. Other: No intraperitoneal free fluid. Musculoskeletal: Bone windows reveal no worrisome lytic or sclerotic osseous lesions. IMPRESSION: 1. Persistent segmentally decreased perfusion or segmental edema in the kidneys bilaterally, similar to prior study. As before, this CT imaging appearance does raise the question of pyelonephritis. Since the  previous  study the patient has developed mild right hydroureteronephrosis and there is decreased excretion from the right kidney consistent with obstructive uropathy. The right ureter is dilated into the pelvis where there is abnormal soft tissue/enhancement in the distal right ureter and right UVJ. 2. Persistent distended and stool-filled colon. Imaging features would be compatible with constipation in the appropriate clinical setting. 3. Persistent marked gallbladder distention, nonspecific. Electronically Signed   By: Misty Stanley M.D.   On: 02/23/2017 15:00   Ct Orbits Wo Contrast  Result Date: 03/21/2017 CLINICAL DATA:  Altered mental status. Speech change and imbalance. 2 falls. Laceration about the orbits. History of cleft palate. Initial encounter. EXAM: CT HEAD AND ORBITS WITHOUT CONTRAST TECHNIQUE: Contiguous axial images were obtained from the base of the skull through the vertex without contrast. Multidetector CT imaging of the orbits was performed using the standard protocol without intravenous contrast. COMPARISON:  Head CT 03/24/2015. FINDINGS: CT HEAD FINDINGS Brain: There is no evidence of acute infarct, intracranial hemorrhage, mass, midline shift, or extra-axial fluid collection. The ventricles and sulci are normal. Prominent extra-axial CSF space at the level of the suprasellar basilar cistern measures 3.5 x 1.5 cm and may represent an incidental arachnoid cyst, unchanged. Vascular: Minimal bilateral carotid siphon calcification. No hyperdense vessel. Skull: No fracture or focal osseous lesion. Other: None. CT ORBITS FINDINGS Orbits: The globes appear intact. The orbital fat is normal in appearance without evidence of hematoma or mass. The extraocular muscles and optic nerve complexes are symmetric and normal in appearance. No acute fracture is identified. Visualized sinuses: Postsurgical changes in the paranasal sinuses and nasal cavity. Left greater than right maxillary sinus osteitis  consistent with chronic sinusitis. Mild mucosal thickening and bubbly fluid in the right maxillary sinus. Large bilateral mastoid effusions. Right middle ear opacification. Soft tissues: Mild right periorbital soft tissue swelling. IMPRESSION: 1. No evidence of acute intracranial abnormality. 2. Mild right periorbital soft tissue swelling without evidence of acute orbital fracture. 3. Chronic sinusitis with possible superimposed acute right maxillary sinusitis. 4. Large mastoid effusions with right middle ear opacification. Electronically Signed   By: Logan Bores M.D.   On: 03/21/2017 18:46     CBC  Recent Labs Lab 03/21/17 1836 03/22/17 0214 03/23/17 0555  WBC 10.0 10.3 10.4  HGB 8.1* 7.8* 6.9*  HCT 26.3* 26.0* 22.9*  PLT 442* 431* 344  MCV 84.6 84.7 86.7  MCH 26.0 25.4* 26.1  MCHC 30.8 30.0 30.1  RDW 16.6* 16.2* 16.7*  LYMPHSABS 2.1  --   --   MONOABS 1.2*  --   --   EOSABS 0.4  --   --   BASOSABS 0.1  --   --     Chemistries   Recent Labs Lab 03/21/17 1940 03/22/17 0214 03/23/17 0555  NA 139 139 143  K 3.0* 2.9* 5.5*  CL 108 110 119*  CO2 21* 20* 20*  GLUCOSE 87 105* 81  BUN 13 11 9   CREATININE 1.93* 1.78* 1.35*  CALCIUM 8.4* 8.0* 7.5*  MG  --  1.9  --   AST 16 17 14*  ALT 11* 10* 9*  ALKPHOS 64 58 49  BILITOT 3.3* 2.8* 1.9*   ------------------------------------------------------------------------------------------------------------------ estimated creatinine clearance is 38.9 mL/min (A) (by C-G formula based on SCr of 1.35 mg/dL (H)). ------------------------------------------------------------------------------------------------------------------ No results for input(s): HGBA1C in the last 72 hours. ------------------------------------------------------------------------------------------------------------------ No results for input(s): CHOL, HDL, LDLCALC, TRIG, CHOLHDL, LDLDIRECT in the last 72  hours. ------------------------------------------------------------------------------------------------------------------ No results for input(s): TSH, T4TOTAL,  T3FREE, THYROIDAB in the last 72 hours.  Invalid input(s): FREET3 ------------------------------------------------------------------------------------------------------------------ No results for input(s): VITAMINB12, FOLATE, FERRITIN, TIBC, IRON, RETICCTPCT in the last 72 hours.  Coagulation profile No results for input(s): INR, PROTIME in the last 168 hours.  No results for input(s): DDIMER in the last 72 hours.  Cardiac Enzymes No results for input(s): CKMB, TROPONINI, MYOGLOBIN in the last 168 hours.  Invalid input(s): CK ------------------------------------------------------------------------------------------------------------------ Invalid input(s): POCBNP   CBG: No results for input(s): GLUCAP in the last 168 hours.     Studies: Dg Chest 2 View  Result Date: 03/21/2017 CLINICAL DATA:  Altered mental status.  Multiple falls today. EXAM: CHEST  2 VIEW COMPARISON:  Chest radiograph Mar 24, 2015 FINDINGS: Cardiomediastinal silhouette is normal. No pleural effusions or focal consolidations. Trachea projects midline and there is no pneumothorax. Soft tissue planes and included osseous structures are non-suspicious. IMPRESSION: Normal chest. Electronically Signed   By: Elon Alas M.D.   On: 03/21/2017 18:21   Ct Head Wo Contrast  Result Date: 03/21/2017 CLINICAL DATA:  Altered mental status. Speech change and imbalance. 2 falls. Laceration about the orbits. History of cleft palate. Initial encounter. EXAM: CT HEAD AND ORBITS WITHOUT CONTRAST TECHNIQUE: Contiguous axial images were obtained from the base of the skull through the vertex without contrast. Multidetector CT imaging of the orbits was performed using the standard protocol without intravenous contrast. COMPARISON:  Head CT 03/24/2015. FINDINGS: CT HEAD  FINDINGS Brain: There is no evidence of acute infarct, intracranial hemorrhage, mass, midline shift, or extra-axial fluid collection. The ventricles and sulci are normal. Prominent extra-axial CSF space at the level of the suprasellar basilar cistern measures 3.5 x 1.5 cm and may represent an incidental arachnoid cyst, unchanged. Vascular: Minimal bilateral carotid siphon calcification. No hyperdense vessel. Skull: No fracture or focal osseous lesion. Other: None. CT ORBITS FINDINGS Orbits: The globes appear intact. The orbital fat is normal in appearance without evidence of hematoma or mass. The extraocular muscles and optic nerve complexes are symmetric and normal in appearance. No acute fracture is identified. Visualized sinuses: Postsurgical changes in the paranasal sinuses and nasal cavity. Left greater than right maxillary sinus osteitis consistent with chronic sinusitis. Mild mucosal thickening and bubbly fluid in the right maxillary sinus. Large bilateral mastoid effusions. Right middle ear opacification. Soft tissues: Mild right periorbital soft tissue swelling. IMPRESSION: 1. No evidence of acute intracranial abnormality. 2. Mild right periorbital soft tissue swelling without evidence of acute orbital fracture. 3. Chronic sinusitis with possible superimposed acute right maxillary sinusitis. 4. Large mastoid effusions with right middle ear opacification. Electronically Signed   By: Logan Bores M.D.   On: 03/21/2017 18:46   Mr Brain Wo Contrast  Result Date: 03/22/2017 CLINICAL DATA:  Initial evaluation for acute fall. EXAM: MRI HEAD WITHOUT CONTRAST TECHNIQUE: Multiplanar, multiecho pulse sequences of the brain and surrounding structures were obtained without intravenous contrast. COMPARISON:  None.  Prior CT from 03/21/2017. FINDINGS: Brain: Cerebral volume within normal limits for age. No focal parenchymal signal abnormality identified. No significant cerebral white matter disease. No abnormal foci  of restricted diffusion to suggest acute or subacute ischemia. Gray-white matter differentiation maintained. No areas of chronic infarction. No evidence for acute or chronic intracranial hemorrhage. No mass lesion, midline shift, or mass effect. No hydrocephalus. Benign arachnoid cyst at the superior cerebellar cistern noted with mild mass effect on the subjacent cerebellar vermis. No other extra-axial fluid collection. Major dural sinuses are grossly patent. Pituitary gland suprasellar region within normal  limits. Midline structures intact and normal. Vascular: Major intracranial vascular flow voids are maintained. Skull and upper cervical spine: Craniocervical junction normal. Visualized upper cervical spine unremarkable. Bone marrow signal intensity within normal limits. Possible trace right periorbital contusion noted. Scalp soft tissues otherwise unremarkable. Sinuses/Orbits: Globes and orbital soft tissues within normal limits. Mucosal thickening within the maxillary sinuses, likely chronic. Paranasal sinuses are otherwise clear. Bilateral mastoid effusions. Other: None. IMPRESSION: 1. Negative brain MRI.  No acute intracranial process identified. 2. Chronic maxillary sinusitis. 3. Bilateral mastoid effusions. Electronically Signed   By: Jeannine Boga M.D.   On: 03/22/2017 05:50   US Abdomen Complete  Result Date: 03/22/2017 CLINICAL DATA:  Smoker, acute kidney injury, elevated total bilirubin, history substance abuse EXAM: ABDOMEN ULTRASOUND COMPLETE COMPARISON:  CT abdomen and pelvis 02/23/2017 FINDINGS: Gallbladder: Distended, at least 10.2 cm length and 4.8 cm transverse. No definite gallstones, wall thickening, pericholecystic fluid or sonographic Murphy sign. Common bile duct: Diameter: 5 mm diameter, normal Liver: Normal appearance IVC: Normal appearance Pancreas: Normal appearance Spleen: Normal appearance, 5.1 cm length Right Kidney: Length: 10.1 cm. Normal morphology without mass or  hydronephrosis. Left Kidney: Length: 10.0 cm. Normal morphology without mass or hydronephrosis. Abdominal aorta: Visualized portion normal caliber, distal aorta and bifurcation obscured by bowel gas Other findings: No free fluid IMPRESSION: Distended gallbladder without stones or wall thickening. Otherwise negative exam. Electronically Signed   By: Lavonia Dana M.D.   On: 03/22/2017 08:15   Ct Orbits Wo Contrast  Result Date: 03/21/2017 CLINICAL DATA:  Altered mental status. Speech change and imbalance. 2 falls. Laceration about the orbits. History of cleft palate. Initial encounter. EXAM: CT HEAD AND ORBITS WITHOUT CONTRAST TECHNIQUE: Contiguous axial images were obtained from the base of the skull through the vertex without contrast. Multidetector CT imaging of the orbits was performed using the standard protocol without intravenous contrast. COMPARISON:  Head CT 03/24/2015. FINDINGS: CT HEAD FINDINGS Brain: There is no evidence of acute infarct, intracranial hemorrhage, mass, midline shift, or extra-axial fluid collection. The ventricles and sulci are normal. Prominent extra-axial CSF space at the level of the suprasellar basilar cistern measures 3.5 x 1.5 cm and may represent an incidental arachnoid cyst, unchanged. Vascular: Minimal bilateral carotid siphon calcification. No hyperdense vessel. Skull: No fracture or focal osseous lesion. Other: None. CT ORBITS FINDINGS Orbits: The globes appear intact. The orbital fat is normal in appearance without evidence of hematoma or mass. The extraocular muscles and optic nerve complexes are symmetric and normal in appearance. No acute fracture is identified. Visualized sinuses: Postsurgical changes in the paranasal sinuses and nasal cavity. Left greater than right maxillary sinus osteitis consistent with chronic sinusitis. Mild mucosal thickening and bubbly fluid in the right maxillary sinus. Large bilateral mastoid effusions. Right middle ear opacification. Soft  tissues: Mild right periorbital soft tissue swelling. IMPRESSION: 1. No evidence of acute intracranial abnormality. 2. Mild right periorbital soft tissue swelling without evidence of acute orbital fracture. 3. Chronic sinusitis with possible superimposed acute right maxillary sinusitis. 4. Large mastoid effusions with right middle ear opacification. Electronically Signed   By: Logan Bores M.D.   On: 03/21/2017 18:46      No results found for: HGBA1C Lab Results  Component Value Date   CREATININE 1.35 (H) 03/23/2017       Scheduled Meds: . carbamazepine  600 mg Oral BID  . gabapentin  800 mg Oral TID  . levothyroxine  75 mcg Oral QAC breakfast  . linaclotide  290 mcg  Oral QAC breakfast  . pantoprazole  40 mg Oral Daily  . QUEtiapine  300 mg Oral QHS  . vortioxetine HBr  20 mg Oral Daily   Continuous Infusions: . sodium chloride    . ferumoxytol       LOS: 1 day    Time spent: >30 MINS    Reyne Dumas  [facility] Pager [phone]. If 7PM-7AM, please contact night-coverage at [email or web address], password [facility] 03/23/2017, 8:50 AM  LOS: 1 day

## 2017-03-23 NOTE — Consult Note (Signed)
Referring Provider: Dr. Allyson Sabal Primary Care Physician:  Redmond School, MD Primary Gastroenterologist:  Dr. Oneida Alar  Reason for Consultation:  Anemia  HPI: Deanna Berry is a 46 y.o. female  past medical history of bipolar disorder was admitted to the hospital with fall and imbalance. GI is consulted for further evaluation of anemia.  Patient seen and examined. She was seen at [place] for constipation and weakness. She was prescribed Amitiza with improvement in constipation. She was found to have anemia. According to patient see has been having on and off dark stools but denied any black tarry stool or bright red blood per rectum. She is complaining of epigastric pain which is chronic for her. Pain is sometimes worse with food. Denied any radiation. She does have generalized abdominal discomfort as well. Denied any nausea or vomiting. She takes OTC pain medication every day but does not remember the name.   Previous GI workup ----------------------- - Patient used to  follow Dr. Britta Mccreedy. - EGD in May 2016 showed esophagitis and to small antral ulcers. Also showed large ulcer in the duodenal bulb. Repeat EGD in August 2016 showed more pyloric channel ulcer with mild pyloric stenosis. Previous gastric and duodenal ulcer were completely healed. - Colonoscopy in September 2016 normal. - Patient was seen by GI nurse practitioner at  [facility] in April 2018.  Past Medical History:  Diagnosis Date  . Bipolar disorder ([place])   . Cleft palate   . Headache(784.0)   . PUD (peptic ulcer disease)   . Sinus congestion   . Substance abuse     Past Surgical History:  Procedure Laterality Date  . CESAREAN SECTION    . COLONOSCOPY  2016   Dr. Britta Mccreedy: normal   . ESOPHAGOGASTRODUODENOSCOPY  03/2015   Dr. Britta Mccreedy: esophagitis, 2 medium-sized ulcers in antrum and pylorus, large ulcer in duodenal bulb, hiatal hernia, no specimens collected.   . ESOPHAGOGASTRODUODENOSCOPY   06/2015   Dr. Britta Mccreedy: small pyloric channel ulcer with mild pyloric stenosis, previous gastric and duodenal ulcers completely healed   . HERNIA REPAIR      Prior to Admission medications   Medication Sig Start Date End Date Taking? Authorizing Provider  carbamazepine (TEGRETOL XR) 200 MG 12 hr tablet Take 600 mg by mouth 2 (two) times daily.  02/10/17  Yes [provider]  gabapentin (NEURONTIN) 800 MG tablet Take 800 mg by mouth 3 (three) times daily. 01/30/17  Yes [provider]  linaclotide (LINZESS) 290 MCG CAPS capsule Take 290 mcg by mouth daily before breakfast.   Yes [provider]  pantoprazole (PROTONIX) 40 MG tablet Take 1 tablet (40 mg total) by mouth daily. 30 minutes before breakfast. 03/10/17  Yes Annitta Needs, NP  QUEtiapine (SEROQUEL XR) 300 MG 24 hr tablet Take 300 mg by mouth at bedtime.  01/25/17  Yes [provider]  TRINTELLIX 20 MG TABS Take 20 mg by mouth daily. 02/10/17  Yes [provider]  levothyroxine (SYNTHROID, LEVOTHROID) 75 MCG tablet Take 75 mcg by mouth daily. 03/21/17   [provider]    Scheduled Meds: . carbamazepine  600 mg Oral BID  . gabapentin  800 mg Oral TID  . levothyroxine  75 mcg Oral QAC breakfast  . linaclotide  290 mcg Oral QAC breakfast  . pantoprazole  40 mg Oral Daily  . QUEtiapine  300 mg Oral QHS  . vortioxetine HBr  20 mg Oral Daily   Continuous Infusions: . sodium chloride    .  ferumoxytol     PRN Meds:.acetaminophen **OR** acetaminophen, ondansetron **OR** ondansetron (ZOFRAN) IV, traMADol  Allergies as of 03/21/2017 - Review Complete 03/21/2017  Allergen Reaction Noted  . Amoxil [amoxicillin] Hives 12/11/2012  . Adhesive [tape] Rash 03/17/2017  . Neosporin original [bacitracin-neomycin-polymyxin] Rash 03/17/2017    Family History  Problem Relation Age of Onset  . Colon cancer Mother 83    Social History   Social History  . Marital status: Single    Spouse name:  N/A  . Number of children: N/A  . Years of education: N/A   Occupational History  . office manager    Social History Main Topics  . Smoking status: Current Every Day Smoker    Packs/day: 1.00    Types: Cigarettes  . Smokeless tobacco: Never Used  . Alcohol use No  . 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 Sexual activity: Not on file   Other Topics Concern  . Not on file   Social History Narrative  . No narrative on file    Review of Systems:  Review of Systems  Constitutional: Positive for malaise/fatigue. Negative for chills and fever.  HENT: Negative for ear discharge, ear pain, hearing loss and tinnitus.   Eyes: Negative for blurred vision and double vision.  Respiratory: Negative for cough, hemoptysis and sputum production.   Cardiovascular: Negative for chest pain and palpitations.  Gastrointestinal: Positive for abdominal pain, constipation and nausea. Negative for blood in stool, diarrhea and melena.  Genitourinary: Positive for flank pain. Negative for hematuria.  Musculoskeletal: Positive for falls, joint pain and myalgias.  Skin: Positive for rash.  Neurological: Positive for weakness. Negative for focal weakness and seizures.  Endo/Heme/Allergies: Does not bruise/bleed easily.  Psychiatric/Behavioral: Negative for hallucinations and suicidal ideas.    Physical Exam: Vital signs: Vitals:   03/22/17 2220 03/23/17 0516  BP:  101/73  Pulse:  93  Resp:  16  Temp: 98 F (36.7 C) 98.2 F (36.8 C)   Last BM Date: 03/22/17 Physical Exam  Constitutional: She is oriented to person, place, and time. She appears well-developed and well-nourished. No distress.  HENT:  Head: Normocephalic.  Nose: Nose normal.  Mouth/Throat: No oropharyngeal exudate.  Bruises over her right upper eyelid noted  Eyes: EOM are normal. No scleral icterus.  Neck: Normal range of motion. Neck supple. No thyromegaly present.  Cardiovascular: Normal  rate, regular rhythm and normal heart sounds.   Pulmonary/Chest: Effort normal and breath sounds normal. No respiratory distress.  Abdominal: Soft. Bowel sounds are normal. She exhibits no distension. There is tenderness. There is no rebound and no guarding.  Epigastric tenderness to palpation  Musculoskeletal: She exhibits no edema or tenderness.  Neurological: She is alert and oriented to person, place, and time.  Skin: Skin is warm. There is pallor.  Psychiatric: She has a normal mood and affect. Her behavior is normal.    GI:  Lab Results:  Recent Labs  03/21/17 1836 03/22/17 0214 03/23/17 0555  WBC 10.0 10.3 10.4  HGB 8.1* 7.8* 6.9*  HCT 26.3* 26.0* 22.9*  PLT 442* 431* 344   BMET  Recent Labs  03/21/17 1940 03/22/17 0214 03/23/17 0555  NA 139 139 143  K 3.0* 2.9* 5.5*  CL 108 110 119*  CO2 21* 20* 20*  GLUCOSE 87 105* 81  BUN 13 11 9   CREATININE 1.93* 1.78* 1.35*  CALCIUM 8.4* 8.0* 7.5*   LFT  Recent Labs  03/23/17 0555  PROT 4.1*  ALBUMIN 1.7*  AST 14*  ALT 9*  ALKPHOS 49  BILITOT 1.9*   PT/INR No results for input(s): LABPROT, INR in the last 72 hours.   Studies/Results: Dg Chest 2 View  Result Date: 03/21/2017 CLINICAL DATA:  Altered mental status.  Multiple falls today. EXAM: CHEST  2 VIEW COMPARISON:  Chest radiograph Mar 24, 2015 FINDINGS: Cardiomediastinal silhouette is normal. No pleural effusions or focal consolidations. Trachea projects midline and there is no pneumothorax. Soft tissue planes and included osseous structures are non-suspicious. IMPRESSION: Normal chest. Electronically Signed   By: Elon Alas M.D.   On: 03/21/2017 18:21   Ct Head Wo Contrast  Result Date: 03/21/2017 CLINICAL DATA:  Altered mental status. Speech change and imbalance. 2 falls. Laceration about the orbits. History of cleft palate. Initial encounter. EXAM: CT HEAD AND ORBITS WITHOUT CONTRAST TECHNIQUE: Contiguous axial images were obtained from the base  of the skull through the vertex without contrast. Multidetector CT imaging of the orbits was performed using the standard protocol without intravenous contrast. COMPARISON:  Head CT 03/24/2015. FINDINGS: CT HEAD FINDINGS Brain: There is no evidence of acute infarct, intracranial hemorrhage, mass, midline shift, or extra-axial fluid collection. The ventricles and sulci are normal. Prominent extra-axial CSF space at the level of the suprasellar basilar cistern measures 3.5 x 1.5 cm and may represent an incidental arachnoid cyst, unchanged. Vascular: Minimal bilateral carotid siphon calcification. No hyperdense vessel. Skull: No fracture or focal osseous lesion. Other: None. CT ORBITS FINDINGS Orbits: The globes appear intact. The orbital fat is normal in appearance without evidence of hematoma or mass. The extraocular muscles and optic nerve complexes are symmetric and normal in appearance. No acute fracture is identified. Visualized sinuses: Postsurgical changes in the paranasal sinuses and nasal cavity. Left greater than right maxillary sinus osteitis consistent with chronic sinusitis. Mild mucosal thickening and bubbly fluid in the right maxillary sinus. Large bilateral mastoid effusions. Right middle ear opacification. Soft tissues: Mild right periorbital soft tissue swelling. IMPRESSION: 1. No evidence of acute intracranial abnormality. 2. Mild right periorbital soft tissue swelling without evidence of acute orbital fracture. 3. Chronic sinusitis with possible superimposed acute right maxillary sinusitis. 4. Large mastoid effusions with right middle ear opacification. Electronically Signed   By: Logan Bores M.D.   On: 03/21/2017 18:46   Mr Brain Wo Contrast  Result Date: 03/22/2017 CLINICAL DATA:  Initial evaluation for acute fall. EXAM: MRI HEAD WITHOUT CONTRAST TECHNIQUE: Multiplanar, multiecho pulse sequences of the brain and surrounding structures were obtained without intravenous contrast. COMPARISON:   None.  Prior CT from 03/21/2017. FINDINGS: Brain: Cerebral volume within normal limits for age. No focal parenchymal signal abnormality identified. No significant cerebral white matter disease. No abnormal foci of restricted diffusion to suggest acute or subacute ischemia. Gray-white matter differentiation maintained. No areas of chronic infarction. No evidence for acute or chronic intracranial hemorrhage. No mass lesion, midline shift, or mass effect. No hydrocephalus. Benign arachnoid cyst at the superior cerebellar cistern noted with mild mass effect on the subjacent cerebellar vermis. No other extra-axial fluid collection. Major dural sinuses are grossly patent. Pituitary gland suprasellar region within normal limits. Midline structures intact and normal. Vascular: Major intracranial vascular flow voids are maintained. Skull and upper cervical spine: Craniocervical junction normal. Visualized upper cervical spine unremarkable. Bone marrow signal intensity within normal limits. Possible trace right periorbital contusion noted. Scalp soft tissues otherwise unremarkable. Sinuses/Orbits: Globes and orbital soft tissues within normal limits. Mucosal thickening  within the maxillary sinuses, likely chronic. Paranasal sinuses are otherwise clear. Bilateral mastoid effusions. Other: None. IMPRESSION: 1. Negative brain MRI.  No acute intracranial process identified. 2. Chronic maxillary sinusitis. 3. Bilateral mastoid effusions. Electronically Signed   By: Jeannine Boga M.D.   On: 03/22/2017 05:50   US Abdomen Complete  Result Date: 03/22/2017 CLINICAL DATA:  Smoker, acute kidney injury, elevated total bilirubin, history substance abuse EXAM: ABDOMEN ULTRASOUND COMPLETE COMPARISON:  CT abdomen and pelvis 02/23/2017 FINDINGS: Gallbladder: Distended, at least 10.2 cm length and 4.8 cm transverse. No definite gallstones, wall thickening, pericholecystic fluid or sonographic Murphy sign. Common bile duct: Diameter:  5 mm diameter, normal Liver: Normal appearance IVC: Normal appearance Pancreas: Normal appearance Spleen: Normal appearance, 5.1 cm length Right Kidney: Length: 10.1 cm. Normal morphology without mass or hydronephrosis. Left Kidney: Length: 10.0 cm. Normal morphology without mass or hydronephrosis. Abdominal aorta: Visualized portion normal caliber, distal aorta and bifurcation obscured by bowel gas Other findings: No free fluid IMPRESSION: Distended gallbladder without stones or wall thickening. Otherwise negative exam. Electronically Signed   By: Lavonia Dana M.D.   On: 03/22/2017 08:15   Ct Orbits Wo Contrast  Result Date: 03/21/2017 CLINICAL DATA:  Altered mental status. Speech change and imbalance. 2 falls. Laceration about the orbits. History of cleft palate. Initial encounter. EXAM: CT HEAD AND ORBITS WITHOUT CONTRAST TECHNIQUE: Contiguous axial images were obtained from the base of the skull through the vertex without contrast. Multidetector CT imaging of the orbits was performed using the standard protocol without intravenous contrast. COMPARISON:  Head CT 03/24/2015. FINDINGS: CT HEAD FINDINGS Brain: There is no evidence of acute infarct, intracranial hemorrhage, mass, midline shift, or extra-axial fluid collection. The ventricles and sulci are normal. Prominent extra-axial CSF space at the level of the suprasellar basilar cistern measures 3.5 x 1.5 cm and may represent an incidental arachnoid cyst, unchanged. Vascular: Minimal bilateral carotid siphon calcification. No hyperdense vessel. Skull: No fracture or focal osseous lesion. Other: None. CT ORBITS FINDINGS Orbits: The globes appear intact. The orbital fat is normal in appearance without evidence of hematoma or mass. The extraocular muscles and optic nerve complexes are symmetric and normal in appearance. No acute fracture is identified. Visualized sinuses: Postsurgical changes in the paranasal sinuses and nasal cavity. Left greater than right  maxillary sinus osteitis consistent with chronic sinusitis. Mild mucosal thickening and bubbly fluid in the right maxillary sinus. Large bilateral mastoid effusions. Right middle ear opacification. Soft tissues: Mild right periorbital soft tissue swelling. IMPRESSION: 1. No evidence of acute intracranial abnormality. 2. Mild right periorbital soft tissue swelling without evidence of acute orbital fracture. 3. Chronic sinusitis with possible superimposed acute right maxillary sinusitis. 4. Large mastoid effusions with right middle ear opacification. Electronically Signed   By: Logan Bores M.D.   On: 03/21/2017 18:46    Impression/Plan: - Symptomatic anemia with hemoglobin of 6.9 now.  HgB was 8.4 last month. Prior hemoglobin normal. Iron studies in April showed iron deficiency anemia with low iron saturation. Occult blood was negative. - History of gastric and duodenal ulcer in 2016. Patient with ongoing use of pain medication. - Elevated total bilirubin. Probably Gilbert's syndrome. Ultrasound and recent CT showed no biliary ductal dilatation. - Follow with imbalance.  Recommendations -------------------------- - Patient with history of gastric ulcer disease and on and off dark colored stool. Now anemia and drop in hemoglobin. EGD tomorrow for further evaluation. Risk benefits alternatives discussed with the patient. She verbalized understanding. Transfuse to keep hemoglobin  around 7 and 8. Patient was advised to avoid NSAIDs. Change PPI to twice a day. Further plan based on endoscopic finding.    LOS: 1 day   Otis Brace  MD, FACP 03/23/2017, 10:47 AM  Pager [phone] If no answer or after 5 PM call [phone]

## 2017-03-23 NOTE — Progress Notes (Signed)
Physical Therapy Treatment Patient Details Name: Deanna Berry MRN: [id number] DOB: [birth year] Today's Date: 03/23/2017    History of Present Illness 46 y.o. female admitted to [facility] on 03/21/17 for fall, imbalance.  Pt dx with acute kidney injury, imbalance/ataxia MRI negative for acute findings, hypokalemia, elevated billirubin, anemia (chronic) normocytic (supplementing with iron), and hydronephrosis on CT scan.  UDS negative. Pt with singificant PMhx of substance abuse, PUD, HA, cleft palate, and bipolar d/o.    PT Comments    Pt with lower Hgb today requiring a blood transfusion.  Per pt she will go for an upper GI study tomorrow to see if the source of the bleeding can be found (has h/o bleeding ulcers).  Pt continues to be significantly fatigued and unbalanced during gait.  I am hopeful it will be better after she gets blood today.  PT will continue to follow acutely.   Follow Up Recommendations  Outpatient PT     Equipment Recommendations  None recommended by PT    Recommendations for Other Services   NA     Precautions / Restrictions Precautions Precautions: Fall Precaution Comments: recent h/o falls Restrictions Weight Bearing Restrictions: No    Mobility  Bed Mobility Overal bed mobility: Independent                Transfers Overall transfer level: Needs assistance Equipment used: None Transfers: Sit to/from Stand Sit to Stand: Supervision         General transfer comment: supervision for safety, cues to reinforce standing for a minute to assess symptoms.  Pt reports general fatigue, no lightheadedness.   Ambulation/Gait Ambulation/Gait assistance: Supervision Ambulation Distance (Feet): 150 Feet Assistive device: None (did stop and stand resting on the hallway rail) Gait Pattern/deviations: Step-through pattern;Staggering left;Staggering right     General Gait Details: Pt only staggered when turning head during gait today.  Better balance, but increased  significantly fatigue (she had to stop at one point and stand to rest with the hallway railing before continuing on to her room.).           Balance Overall balance assessment: Needs assistance Sitting-balance support: Feet supported;No upper extremity supported Sitting balance-Leahy Scale: Normal     Standing balance support: No upper extremity supported Standing balance-Leahy Scale: Good Standing balance comment: Worked on balance postures in standing (tandem bil, SLS bil, and feet together eyes closed) all producing more sway and supervision than would be normal for her age).  PT will be interested to see if fatigue and low blood level are affecting balance this much or if she is the same amount of unstable after blood transfusion today.                             Cognition Arousal/Alertness: Awake/alert Behavior During Therapy: WFL for tasks assessed/performed Overall Cognitive Status: Within Functional Limits for tasks assessed                                               Pertinent Vitals/Pain Pain Assessment: No/denies pain           PT Goals (current goals can now be found in the care plan section) Acute Rehab PT Goals Patient Stated Goal: to figure out why these episodes keep happening.  Progress towards PT goals: Progressing toward goals  Frequency    Min 3X/week      PT Plan Current plan remains appropriate       AM-PAC PT "6 Clicks" Daily Activity  Outcome Measure  Difficulty turning over in bed (including adjusting bedclothes, sheets and blankets)?: None Difficulty moving from lying on back to sitting on the side of the bed? : None Difficulty sitting down on and standing up from a chair with arms (e.g., wheelchair, bedside commode, etc,.)?: None Help needed moving to and from a bed to chair (including a wheelchair)?: None Help needed walking in hospital room?: None Help needed climbing 3-5 steps with a railing? : A  Little 6 Click Score: 23    End of Session Equipment Utilized During Treatment: Gait belt Activity Tolerance: Patient limited by fatigue Patient left: in bed;with call bell/phone within reach;with bed alarm set   PT Visit Diagnosis: Unsteadiness on feet (R26.81);Difficulty in walking, not elsewhere classified (R26.2)     Time: 1884-1660 PT Time Calculation (min) (ACUTE ONLY): 29 min  Charges:  $Gait Training: 8-22 mins $Therapeutic Exercise: 8-22 mins          Merikay Lesniewski B. [place], [place], DPT [phone]            03/23/2017, 11:18 AM

## 2017-03-24 ENCOUNTER — Encounter (HOSPITAL_COMMUNITY): Payer: Self-pay | Admitting: Certified Registered Nurse Anesthetist

## 2017-03-24 ENCOUNTER — Inpatient Hospital Stay (HOSPITAL_COMMUNITY): Payer: BLUE CROSS/BLUE SHIELD | Admitting: Certified Registered Nurse Anesthetist

## 2017-03-24 ENCOUNTER — Encounter (HOSPITAL_COMMUNITY)
Admission: EM | Disposition: A | Discharge status: Home / Self Care | Payer: Self-pay | Source: Home / Self Care | Attending: Internal Medicine

## 2017-03-24 DIAGNOSIS — K2950 Unspecified chronic gastritis without bleeding: Secondary | ICD-10-CM | POA: Diagnosis not present

## 2017-03-24 HISTORY — PX: ESOPHAGOGASTRODUODENOSCOPY (EGD) WITH PROPOFOL: SHX5813

## 2017-03-24 LAB — TYPE AND SCREEN
ABO/RH(D): A POS
ANTIBODY SCREEN: NEGATIVE
Unit division: 0
Unit division: 0

## 2017-03-24 LAB — COMPREHENSIVE METABOLIC PANEL
ALT: 8 U/L — AB (ref 14–54)
AST: 12 U/L — ABNORMAL LOW (ref 15–41)
Albumin: 1.8 g/dL — ABNORMAL LOW (ref 3.5–5.0)
Alkaline Phosphatase: 44 U/L (ref 38–126)
Anion gap: 7 (ref 5–15)
BUN: 9 mg/dL (ref 6–20)
CALCIUM: 7.7 mg/dL — AB (ref 8.9–10.3)
CO2: 19 mmol/L — AB (ref 22–32)
CREATININE: 1.29 mg/dL — AB (ref 0.44–1.00)
Chloride: 113 mmol/L — ABNORMAL HIGH (ref 101–111)
GFR, EST AFRICAN AMERICAN: 57 mL/min — AB (ref >60)
GFR, EST NON AFRICAN AMERICAN: 49 mL/min — AB (ref >60)
Glucose, Bld: 84 mg/dL (ref 65–99)
Potassium: 4.5 mmol/L (ref 3.5–5.1)
SODIUM: 139 mmol/L (ref 135–145)
TOTAL PROTEIN: 4.1 g/dL — AB (ref 6.5–8.1)
Total Bilirubin: 1.2 mg/dL (ref 0.3–1.2)

## 2017-03-24 LAB — CBC
HCT: 29.1 % — ABNORMAL LOW (ref 36.0–46.0)
HEMOGLOBIN: 9.3 g/dL — AB (ref 12.0–15.0)
MCH: 26.7 pg (ref 26.0–34.0)
MCHC: 31.3 g/dL (ref 30.0–36.0)
MCV: 85.3 fL (ref 78.0–100.0)
Platelets: 282 10*3/uL (ref 150–400)
RBC: 3.41 MIL/uL — ABNORMAL LOW (ref 3.87–5.11)
RDW: 16.4 % — ABNORMAL HIGH (ref 11.5–15.5)
WBC: 8.8 10*3/uL (ref 4.0–10.5)

## 2017-03-24 LAB — BPAM RBC
BLOOD PRODUCT EXPIRATION DATE: 201806062359
BLOOD PRODUCT EXPIRATION DATE: 201806072359
ISSUE DATE / TIME: 201805241408
ISSUE DATE / TIME: 201805242012
UNIT TYPE AND RH: 6200
Unit Type and Rh: 6200

## 2017-03-24 LAB — HAPTOGLOBIN: Haptoglobin: 208 mg/dL — ABNORMAL HIGH (ref 34–200)

## 2017-03-24 SURGERY — ESOPHAGOGASTRODUODENOSCOPY (EGD) WITH PROPOFOL
Anesthesia: Monitor Anesthesia Care

## 2017-03-24 MED ORDER — LACTATED RINGERS IV SOLN
INTRAVENOUS | Status: DC | PRN
  Administered 2017-03-24: 13:00:00 via INTRAVENOUS

## 2017-03-24 MED ORDER — FENTANYL CITRATE (PF) 100 MCG/2ML IJ SOLN
25.0000 ug | Freq: Once | INTRAMUSCULAR | Status: AC
  Administered 2017-03-24: 25 ug via INTRAVENOUS

## 2017-03-24 MED ORDER — PROPOFOL 500 MG/50ML IV EMUL
INTRAVENOUS | Status: DC | PRN
  Administered 2017-03-24: 100 ug/kg/min via INTRAVENOUS

## 2017-03-24 MED ORDER — PANTOPRAZOLE SODIUM 40 MG PO TBEC: 40.0000 mg | DELAYED_RELEASE_TABLET | Freq: Two times a day (BID) | ORAL | 2 refills | Status: DC

## 2017-03-24 MED ORDER — LACTATED RINGERS IV SOLN
INTRAVENOUS | Status: DC
  Administered 2017-03-24: 12:00:00 via INTRAVENOUS

## 2017-03-24 MED ORDER — FENTANYL CITRATE (PF) 100 MCG/2ML IJ SOLN
INTRAMUSCULAR | Status: AC
  Filled 2017-03-24: qty 2

## 2017-03-24 MED ORDER — SODIUM CHLORIDE 0.9 % IV SOLN: INTRAVENOUS | Status: DC

## 2017-03-24 MED ORDER — PROPOFOL 10 MG/ML IV BOLUS
INTRAVENOUS | Status: DC | PRN
  Administered 2017-03-24 (×4): 10 mg via INTRAVENOUS

## 2017-03-24 MED ORDER — PANTOPRAZOLE SODIUM 40 MG IV SOLR
40.0000 mg | Freq: Two times a day (BID) | INTRAVENOUS | Status: DC
  Administered 2017-03-24 – 2017-03-25 (×3): 40 mg via INTRAVENOUS
  Filled 2017-03-24 (×5): qty 40

## 2017-03-24 SURGICAL SUPPLY — 15 items

## 2017-03-24 NOTE — Anesthesia Preprocedure Evaluation (Signed)
Anesthesia Evaluation  Patient identified by MRN, date of birth, ID band Patient awake    Reviewed: Allergy & Precautions, NPO status , Patient's Chart, lab work & pertinent test results  Airway Mallampati: II  TM Distance: >3 FB Neck ROM: Full    Dental no notable dental hx. (+) Poor Dentition, Missing   Pulmonary Current Smoker,    Pulmonary exam normal breath sounds clear to auscultation       Cardiovascular negative cardio ROS Normal cardiovascular exam Rhythm:Regular Rate:Normal     Neuro/Psych Bipolar Disorder negative neurological ROS     GI/Hepatic PUD, (+)     substance abuse  ,   Endo/Other  negative endocrine ROS  Renal/GU negative Renal ROS  negative genitourinary   Musculoskeletal negative musculoskeletal ROS (+)   Abdominal   Peds negative pediatric ROS (+)  Hematology negative hematology ROS (+) anemia ,   Anesthesia Other Findings   Reproductive/Obstetrics negative OB ROS                             Anesthesia Physical Anesthesia Plan  ASA: III  Anesthesia Plan: MAC   Post-op Pain Management:    Induction:   Airway Management Planned: Nasal Cannula  Additional Equipment:   Intra-op Plan:   Post-operative Plan:   Informed Consent: I have reviewed the patients History and Physical, chart, labs and discussed the procedure including the risks, benefits and alternatives for the proposed anesthesia with the patient or authorized representative who has indicated his/her understanding and acceptance.   Dental advisory given  Plan Discussed with: CRNA  Anesthesia Plan Comments:         Anesthesia Quick Evaluation

## 2017-03-24 NOTE — Discharge Summary (Addendum)
Physician Discharge Summary  Deanna Berry MRN: [id number] DOB/AGE: December 28, 1970 46 y.o.  PCP: Redmond School, MD   Admit date: 03/21/2017 Discharge date: 03/24/2017  Discharge Diagnoses:    Principal Problem:   AKI (acute kidney injury) [facility]) Active Problems:   Bipolar 1 disorder ([place])   Anemia   Constipation   Hypokalemia   Hyperbilirubinemia   Ataxia   Hypothyroidism   Acute cystitis without hematuria  Addendum EGD   Z-line regular, 36 cm from the incisors.                           - Small hiatal hernia.                           - Non-bleeding gastric ulcer with a clean ulcer base (Forrest Class III).                           - Gastritis. Biopsied                              Repeat upper endoscopy in 8 weeks to check  healing.                           - Return to GI clinic in 3 weeks. HOLD DC  UNTIL AM , ENSURE HG STAY STABLE                               Follow-up recommendations Follow-up with PCP in 3-5 days , including all  additional recommended appointments as below Follow-up CBC, CMP in 3-5 days follow-up with primary GI-  Dr. Oneida Alar after discharge in 2-3 weeks Patient instructed not to drive without supervision     Current Discharge Medication List    CONTINUE these medications which have CHANGED   Details  pantoprazole (PROTONIX) 40 MG tablet Take 1 tablet (40 mg total) by mouth 2 (two) times daily before a meal. Qty: 60 tablet, Refills: 2      CONTINUE these medications which have NOT CHANGED   Details  carbamazepine (TEGRETOL XR) 200 MG 12 hr tablet Take 600 mg by mouth 2 (two) times daily.  Refills: 0    gabapentin (NEURONTIN) 800 MG tablet Take 800 mg by mouth 3 (three) times daily. Refills: 0    linaclotide (LINZESS) 290 MCG CAPS capsule Take 290 mcg by mouth daily before breakfast.    QUEtiapine (SEROQUEL XR) 300 MG 24 hr tablet Take 300 mg by mouth at bedtime.  Refills: 0    TRINTELLIX 20 MG TABS Take 20 mg by mouth daily. Refills: 0     levothyroxine (SYNTHROID, LEVOTHROID) 75 MCG tablet Take 75 mcg by mouth daily. Refills: 0         Discharge Condition: Prognosis guarded, fall precautions,   Discharge Instructions Get Medicines reviewed and adjusted: Please take all your medications with you for your next visit with your Primary MD  Please request your Primary MD to go over all hospital tests and procedure/radiological results at the follow up, please ask your Primary MD to get all Hospital records sent to his/her office.  If you experience worsening of your admission symptoms, develop shortness of breath, life threatening emergency, suicidal or homicidal thoughts you  must seek medical attention immediately by calling 911 or calling your MD immediately if symptoms less severe.  You must read complete instructions/literature along with all the possible adverse reactions/side effects for all the Medicines you take and that have been prescribed to you. Take any new Medicines after you have completely understood and accpet all the possible adverse reactions/side effects.   Do not drive when taking Pain medications.   Do not take more than prescribed Pain, Sleep and Anxiety Medications  Special Instructions: If you have smoked or chewed Tobacco in the last 2 yrs please stop smoking, stop any regular Alcohol and or any Recreational drug use.  Wear Seat belts while driving.  Please note  You were cared for by a hospitalist during your hospital stay. Once you are discharged, your primary care physician will handle any further medical issues. Please note that NO REFILLS for any discharge medications will be authorized once you are discharged, as it is imperative that you return to your primary care physician (or establish a relationship with a primary care physician if you do not have one) for your aftercare needs so that they can reassess your need for medications and monitor your lab values.  Discharge Instructions     Ambulatory referral to Occupational Therapy    Complete by:  As directed    Ambulatory referral to Physical Therapy    Complete by:  As directed        Allergies  Allergen Reactions  . Amoxil [Amoxicillin] Hives  . Adhesive [Tape] Rash  . Neosporin Original [Bacitracin-Neomycin-Polymyxin] Rash      Disposition: 01-Home or Self Care   Consults: * GI    Significant Diagnostic Studies:  Dg Chest 2 View  Result Date: 03/21/2017 CLINICAL DATA:  Altered mental status.  Multiple falls today. EXAM: CHEST  2 VIEW COMPARISON:  Chest radiograph Mar 24, 2015 FINDINGS: Cardiomediastinal silhouette is normal. No pleural effusions or focal consolidations. Trachea projects midline and there is no pneumothorax. Soft tissue planes and included osseous structures are non-suspicious. IMPRESSION: Normal chest. Electronically Signed   By: Elon Alas M.D.   On: 03/21/2017 18:21   Ct Head Wo Contrast  Result Date: 03/21/2017 CLINICAL DATA:  Altered mental status. Speech change and imbalance. 2 falls. Laceration about the orbits. History of cleft palate. Initial encounter. EXAM: CT HEAD AND ORBITS WITHOUT CONTRAST TECHNIQUE: Contiguous axial images were obtained from the base of the skull through the vertex without contrast. Multidetector CT imaging of the orbits was performed using the standard protocol without intravenous contrast. COMPARISON:  Head CT 03/24/2015. FINDINGS: CT HEAD FINDINGS Brain: There is no evidence of acute infarct, intracranial hemorrhage, mass, midline shift, or extra-axial fluid collection. The ventricles and sulci are normal. Prominent extra-axial CSF space at the level of the suprasellar basilar cistern measures 3.5 x 1.5 cm and may represent an incidental arachnoid cyst, unchanged. Vascular: Minimal bilateral carotid siphon calcification. No hyperdense vessel. Skull: No fracture or focal osseous lesion. Other: None. CT ORBITS FINDINGS Orbits: The globes appear intact.  The orbital fat is normal in appearance without evidence of hematoma or mass. The extraocular muscles and optic nerve complexes are symmetric and normal in appearance. No acute fracture is identified. Visualized sinuses: Postsurgical changes in the paranasal sinuses and nasal cavity. Left greater than right maxillary sinus osteitis consistent with chronic sinusitis. Mild mucosal thickening and bubbly fluid in the right maxillary sinus. Large bilateral mastoid effusions. Right middle ear opacification. Soft tissues: Mild right periorbital soft  tissue swelling. IMPRESSION: 1. No evidence of acute intracranial abnormality. 2. Mild right periorbital soft tissue swelling without evidence of acute orbital fracture. 3. Chronic sinusitis with possible superimposed acute right maxillary sinusitis. 4. Large mastoid effusions with right middle ear opacification. Electronically Signed   By: Logan Bores M.D.   On: 03/21/2017 18:46   Mr Brain Wo Contrast  Result Date: 03/22/2017 CLINICAL DATA:  Initial evaluation for acute fall. EXAM: MRI HEAD WITHOUT CONTRAST TECHNIQUE: Multiplanar, multiecho pulse sequences of the brain and surrounding structures were obtained without intravenous contrast. COMPARISON:  None.  Prior CT from 03/21/2017. FINDINGS: Brain: Cerebral volume within normal limits for age. No focal parenchymal signal abnormality identified. No significant cerebral white matter disease. No abnormal foci of restricted diffusion to suggest acute or subacute ischemia. Gray-white matter differentiation maintained. No areas of chronic infarction. No evidence for acute or chronic intracranial hemorrhage. No mass lesion, midline shift, or mass effect. No hydrocephalus. Benign arachnoid cyst at the superior cerebellar cistern noted with mild mass effect on the subjacent cerebellar vermis. No other extra-axial fluid collection. Major dural sinuses are grossly patent. Pituitary gland suprasellar region within normal limits.  Midline structures intact and normal. Vascular: Major intracranial vascular flow voids are maintained. Skull and upper cervical spine: Craniocervical junction normal. Visualized upper cervical spine unremarkable. Bone marrow signal intensity within normal limits. Possible trace right periorbital contusion noted. Scalp soft tissues otherwise unremarkable. Sinuses/Orbits: Globes and orbital soft tissues within normal limits. Mucosal thickening within the maxillary sinuses, likely chronic. Paranasal sinuses are otherwise clear. Bilateral mastoid effusions. Other: None. IMPRESSION: 1. Negative brain MRI.  No acute intracranial process identified. 2. Chronic maxillary sinusitis. 3. Bilateral mastoid effusions. Electronically Signed   By: Jeannine Boga M.D.   On: 03/22/2017 05:50   US Abdomen Complete  Result Date: 03/22/2017 CLINICAL DATA:  Smoker, acute kidney injury, elevated total bilirubin, history substance abuse EXAM: ABDOMEN ULTRASOUND COMPLETE COMPARISON:  CT abdomen and pelvis 02/23/2017 FINDINGS: Gallbladder: Distended, at least 10.2 cm length and 4.8 cm transverse. No definite gallstones, wall thickening, pericholecystic fluid or sonographic Murphy sign. Common bile duct: Diameter: 5 mm diameter, normal Liver: Normal appearance IVC: Normal appearance Pancreas: Normal appearance Spleen: Normal appearance, 5.1 cm length Right Kidney: Length: 10.1 cm. Normal morphology without mass or hydronephrosis. Left Kidney: Length: 10.0 cm. Normal morphology without mass or hydronephrosis. Abdominal aorta: Visualized portion normal caliber, distal aorta and bifurcation obscured by bowel gas Other findings: No free fluid IMPRESSION: Distended gallbladder without stones or wall thickening. Otherwise negative exam. Electronically Signed   By: Lavonia Dana M.D.   On: 03/22/2017 08:15   Ct Abdomen Pelvis W Contrast  Result Date: 02/23/2017 CLINICAL DATA:  Worsening constipation with abdominal distention and  tenderness. Nausea and vomiting this morning. EXAM: CT ABDOMEN AND PELVIS WITH CONTRAST TECHNIQUE: Multidetector CT imaging of the abdomen and pelvis was performed using the standard protocol following bolus administration of intravenous contrast. CONTRAST:  45m ISOVUE-300 IOPAMIDOL (ISOVUE-300) INJECTION 61% COMPARISON:  02/02/2017 FINDINGS: Lower chest:  Unremarkable. Hepatobiliary: No focal abnormality within the liver parenchyma. Mild intrahepatic biliary duct distention is stable. Marked distention of the gallbladder is stable. No substantial extrahepatic biliary duct dilatation. Pancreas: No focal mass lesion. No dilatation of the main duct. No intraparenchymal cyst. No peripancreatic edema. Spleen: No splenomegaly. No focal mass lesion. Adrenals/Urinary Tract: No adrenal nodule or mass. Cortical thinning noted in the kidneys bilaterally with areas of segmental a decreased cortical perfusion bilaterally. This appearance can be seen  in the setting of pyelonephritis. There is mild right hydroureteronephrosis, new in the interval with distention of the right ureter down to the distal right ureter/right UVJ where asymmetric soft tissue attenuation/ enhancement is identified when compared to the left. Stomach/Bowel: Stomach is nondistended. No gastric wall thickening. No evidence of outlet obstruction. Duodenum is normally positioned as is the ligament of Treitz. No small bowel wall thickening. No small bowel dilatation. The terminal ileum is normal. The appendix is normal. Colon is prominently stool distended throughout. Vascular/Lymphatic: There is abdominal aortic atherosclerosis without aneurysm. Scattered small lymph nodes identified in the root of the small bowel mesentery. No retroperitoneal lymphadenopathy. No pelvic sidewall lymphadenopathy. Reproductive: The uterus has normal CT imaging appearance. There is no adnexal mass. Other: No intraperitoneal free fluid. Musculoskeletal: Bone windows reveal no  worrisome lytic or sclerotic osseous lesions. IMPRESSION: 1. Persistent segmentally decreased perfusion or segmental edema in the kidneys bilaterally, similar to prior study. As before, this CT imaging appearance does raise the question of pyelonephritis. Since the previous study the patient has developed mild right hydroureteronephrosis and there is decreased excretion from the right kidney consistent with obstructive uropathy. The right ureter is dilated into the pelvis where there is abnormal soft tissue/enhancement in the distal right ureter and right UVJ. 2. Persistent distended and stool-filled colon. Imaging features would be compatible with constipation in the appropriate clinical setting. 3. Persistent marked gallbladder distention, nonspecific. Electronically Signed   By: Misty Stanley M.D.   On: 02/23/2017 15:00   Ct Orbits Wo Contrast  Result Date: 03/21/2017 CLINICAL DATA:  Altered mental status. Speech change and imbalance. 2 falls. Laceration about the orbits. History of cleft palate. Initial encounter. EXAM: CT HEAD AND ORBITS WITHOUT CONTRAST TECHNIQUE: Contiguous axial images were obtained from the base of the skull through the vertex without contrast. Multidetector CT imaging of the orbits was performed using the standard protocol without intravenous contrast. COMPARISON:  Head CT 03/24/2015. FINDINGS: CT HEAD FINDINGS Brain: There is no evidence of acute infarct, intracranial hemorrhage, mass, midline shift, or extra-axial fluid collection. The ventricles and sulci are normal. Prominent extra-axial CSF space at the level of the suprasellar basilar cistern measures 3.5 x 1.5 cm and may represent an incidental arachnoid cyst, unchanged. Vascular: Minimal bilateral carotid siphon calcification. No hyperdense vessel. Skull: No fracture or focal osseous lesion. Other: None. CT ORBITS FINDINGS Orbits: The globes appear intact. The orbital fat is normal in appearance without evidence of hematoma or  mass. The extraocular muscles and optic nerve complexes are symmetric and normal in appearance. No acute fracture is identified. Visualized sinuses: Postsurgical changes in the paranasal sinuses and nasal cavity. Left greater than right maxillary sinus osteitis consistent with chronic sinusitis. Mild mucosal thickening and bubbly fluid in the right maxillary sinus. Large bilateral mastoid effusions. Right middle ear opacification. Soft tissues: Mild right periorbital soft tissue swelling. IMPRESSION: 1. No evidence of acute intracranial abnormality. 2. Mild right periorbital soft tissue swelling without evidence of acute orbital fracture. 3. Chronic sinusitis with possible superimposed acute right maxillary sinusitis. 4. Large mastoid effusions with right middle ear opacification. Electronically Signed   By: Logan Bores M.D.   On: 03/21/2017 18:46        Filed Weights   03/21/17 1651 03/22/17 0225  Weight: 51.7 kg (114 lb) 53.4 kg (117 lb 12.8 oz)     Microbiology: Recent Results (from the past 240 hour(s))  Culture, Urine     Status: Abnormal   Collection Time: 03/21/17  9:50 PM  Result Value Ref Range Status   Specimen Description URINE, RANDOM  Final   Special Requests ADDED [id number]  Final   Culture (A)  Final    >=100,000 COLONIES/mL LACTOBACILLUS SPECIES Standardized susceptibility testing for this organism is not available.    Report Status 03/23/2017 FINAL  Final       Blood Culture    Component Value Date/Time   SDES URINE, RANDOM 03/21/2017 2150   [place] [id number] 03/21/2017 2150   CULT (A) 03/21/2017 2150    >=100,000 COLONIES/mL LACTOBACILLUS SPECIES Standardized susceptibility testing for this organism is not available.    REPTSTATUS 03/23/2017 FINAL 03/21/2017 2150      Labs: Results for orders placed or performed during the hospital encounter of 03/21/17 (from the past 48 hour(s))  Comprehensive metabolic panel     Status: Abnormal    Collection Time: 03/23/17  5:55 AM  Result Value Ref Range   Sodium 143 135 - 145 mmol/L   Potassium 5.5 (H) 3.5 - 5.1 mmol/L   Chloride 119 (H) 101 - 111 mmol/L   CO2 20 (L) 22 - 32 mmol/L   Glucose, Bld 81 65 - 99 mg/dL   BUN 9 6 - 20 mg/dL   Creatinine, Ser 1.35 (H) 0.44 - 1.00 mg/dL   Calcium 7.5 (L) 8.9 - 10.3 mg/dL   Total Protein 4.1 (L) 6.5 - 8.1 g/dL   Albumin 1.7 (L) 3.5 - 5.0 g/dL   AST 14 (L) 15 - 41 U/L   ALT 9 (L) 14 - 54 U/L   Alkaline Phosphatase 49 38 - 126 U/L   Total Bilirubin 1.9 (H) 0.3 - 1.2 mg/dL   GFR calc non Af Amer 46 (L) >60 mL/min   GFR calc Af Amer 54 (L) >60 mL/min    Comment: (NOTE) The eGFR has been calculated using the CKD EPI equation. This calculation has not been validated in all clinical situations. eGFR's persistently <60 mL/min signify possible Chronic Kidney Disease.    Anion gap 4 (L) 5 - 15  CBC     Status: Abnormal   Collection Time: 03/23/17  5:55 AM  Result Value Ref Range   WBC 10.4 4.0 - 10.5 K/uL   RBC 2.64 (L) 3.87 - 5.11 MIL/uL   Hemoglobin 6.9 (LL) 12.0 - 15.0 g/dL    Comment: REPEATED TO VERIFY CRITICAL RESULT CALLED TO, READ BACK BY AND VERIFIED WITH: H. NGUYEN,RN [id number] BY S. YARBROUGH    HCT 22.9 (L) 36.0 - 46.0 %   MCV 86.7 78.0 - 100.0 fL   MCH 26.1 26.0 - 34.0 pg   MCHC 30.1 30.0 - 36.0 g/dL   RDW 16.7 (H) 11.5 - 15.5 %   Platelets 344 150 - 400 K/uL  Type and screen [place]     Status: None   Collection Time: 03/23/17 10:00 AM  Result Value Ref Range   ABO/RH(D) A POS    Antibody Screen NEG    Sample Expiration 03/26/2017    Unit Number [id number]    Blood Component Type RED CELLS,LR    Unit division 00    Status of Unit ISSUED,FINAL    Transfusion Status OK TO TRANSFUSE    Crossmatch Result Compatible    Unit Number [id number]    Blood Component Type RED CELLS,LR    Unit division 00    Status of Unit ISSUED,FINAL    Transfusion Status OK TO TRANSFUSE    Crossmatch  Result Compatible   Prepare RBC     Status: None   Collection Time: 03/23/17 10:00 AM  Result Value Ref Range   Order Confirmation ORDER PROCESSED BY BLOOD BANK   Lactate dehydrogenase     Status: Abnormal   Collection Time: 03/23/17 10:00 AM  Result Value Ref Range   LDH 312 (H) 98 - 192 U/L  Haptoglobin     Status: Abnormal   Collection Time: 03/23/17 10:00 AM  Result Value Ref Range   Haptoglobin 208 (H) 34 - 200 mg/dL    Comment: (NOTE) Performed At: [facility] [place], [place] [id number] Lindon Romp MD ZD:[id number]   Save smear     Status: None   Collection Time: 03/23/17 10:00 AM  Result Value Ref Range   Smear Review SMEAR STAINED AND AVAILABLE FOR REVIEW   ABO/Rh     Status: None   Collection Time: 03/23/17 10:00 AM  Result Value Ref Range   ABO/RH(D) A POS   CBC     Status: Abnormal   Collection Time: 03/24/17  5:26 AM  Result Value Ref Range   WBC 8.8 4.0 - 10.5 K/uL   RBC 3.41 (L) 3.87 - 5.11 MIL/uL   Hemoglobin 9.3 (L) 12.0 - 15.0 g/dL    Comment: DELTA CHECK NOTED REPEATED TO VERIFY POST TRANSFUSION SPECIMEN    HCT 29.1 (L) 36.0 - 46.0 %   MCV 85.3 78.0 - 100.0 fL   MCH 26.7 26.0 - 34.0 pg   MCHC 31.3 30.0 - 36.0 g/dL   RDW 16.4 (H) 11.5 - 15.5 %   Platelets 282 150 - 400 K/uL  Comprehensive metabolic panel     Status: Abnormal   Collection Time: 03/24/17  5:26 AM  Result Value Ref Range   Sodium 139 135 - 145 mmol/L   Potassium 4.5 3.5 - 5.1 mmol/L    Comment: DELTA CHECK NOTED   Chloride 113 (H) 101 - 111 mmol/L   CO2 19 (L) 22 - 32 mmol/L   Glucose, Bld 84 65 - 99 mg/dL   BUN 9 6 - 20 mg/dL   Creatinine, Ser 1.29 (H) 0.44 - 1.00 mg/dL   Calcium 7.7 (L) 8.9 - 10.3 mg/dL   Total Protein 4.1 (L) 6.5 - 8.1 g/dL   Albumin 1.8 (L) 3.5 - 5.0 g/dL   AST 12 (L) 15 - 41 U/L   ALT 8 (L) 14 - 54 U/L   Alkaline Phosphatase 44 38 - 126 U/L   Total Bilirubin 1.2 0.3 - 1.2 mg/dL   GFR calc non Af Amer 49 (L) >60 mL/min   GFR  calc Af Amer 57 (L) >60 mL/min    Comment: (NOTE) The eGFR has been calculated using the CKD EPI equation. This calculation has not been validated in all clinical situations. eGFR's persistently <60 mL/min signify possible Chronic Kidney Disease.    Anion gap 7 5 - 15       Lab Results  Component Value Date   CREATININE 1.29 (H) 03/24/2017     HPI :   Deanna Berry a 46 y.o.femalewith a past medical history significant for bipolar disorderwho presents with fall.  The patient was initially sent over from Psychiatrist's office because of fall. She reports feeling her normal self today, going to her regular appointment, concluding the appointment as usual and then when she went to stand up, she felt "off-balance" and fell over.Found to have Na 139, K 3.0, Cr 1.93(baseline 1.4), WBC  10K, Hgb 8.1 . Blood pressure soft. Patient admits to poor oral intake.She was seen at [place] for constipation and weakness. She was prescribed Amitiza with improvement in constipation. She was found to have anemia. According to patient see has been having on and off dark stools but denied any black tarry stool or bright red blood per rectum. EGD in May 2016 showed esophagitis and to small antral ulcers. Also showed large ulcer in the duodenal bulb. Repeat EGD in August 2016 showed more pyloric channel ulcer with mild pyloric stenosis. Previous gastric and duodenal ulcer were completely healed.  Colonoscopy in September 2016 normal.  Patient was seen by GI nurse practitioner at  [facility] gastroenterology associate in April 2018.   HOSPITAL COURSE:   1. Acute kidney injury: In setting of NSAID use for chronic pain.versus poor oral intake for several days -Counseled to avoid NSAIDs  Renal function has improved, likely prerenal, DC IV fluids -Renal ultrasound unremarkable Follow renal function, improving 1.93>1.78>1.35>1.29    2. Imbalance/ataxia: Suspect this is side effect from  psychiatric medications in setting of AKI.  Versus worsening anemia Telemetry uneventful, carbamazepine level within normal limits, negative EtOH, UDS negative Patient found to be slightly orthostatic this admission which could be related to poor oral intake/anemia  MR brain negative  3. Hypokalemia: Potassium repleted, magnesium 1.9  4. Elevated bilirubin:3.3>19 CT showed dilated GB last month. Currently abdomen exam benign.Ultrasound benign -  5. Anemia, normocytic: Iron deficient at labs drawn this month. Got Feraheme at GI office. Will repeat No report of melena, hematemesis, hematochezia. Hemoglobin has trended down from 8.1-6.9,  status post 2 units of packed red blood cells,  EGD dated May 2016. This showed esophagitis, 2 medium-sized ulcers in the antrum and at pylorus, large ulcer in duodenal bulb, hiatal hernia [facility] consulted for repeat EGD? Repeat EGD showed................. Continue PPI twice a day Patient to follow-up with Dr. Oneida Alar in [place]  6. Hypothyroidism: Recently started on levothyroxine -Continue levothyroxine  7. Hydronephrosis on CT: UA shows pyuria and crystals. Urine culture  >=100,000 COLONIES/mL LACTOBACILLUS SPECIES  Renal US within normal limits    Discharge Exam:   Blood pressure 121/77, pulse 76, temperature 97.6 F (36.4 C), temperature source Oral, resp. rate 16, height 4' 11"  (1.499 m), weight 53.4 kg (117 lb 12.8 oz), last menstrual period 02/24/2017, SpO2 98 %. Bruises over her right upper eyelid noted  Eyes: EOM are normal. No scleral icterus.  Neck: Normal range of motion. Neck supple. No thyromegaly present.  Cardiovascular: Normal rate, regular rhythm and normal heart sounds.   Pulmonary/Chest: Effort normal and breath sounds normal. No respiratory distress.  Abdominal: Soft. Bowel sounds are normal. She exhibits no distension. There is tenderness. There is no rebound and no guarding.  Epigastric tenderness to  palpation  Musculoskeletal: She exhibits no edema or tenderness.  Neurological: She is alert and oriented to person, place, and time     Follow-up Information    [facility] Follow up.   Specialty:  Rehabilitation Why:  Outpatiet PT and OT arranged , office will call to set up appointments Contact information: [place] [id number] [place]       Danie Binder, MD. Call.   Specialty:  Gastroenterology Why:  Follow-up with GI in 2-3 weeks. Call to schedule appointment Contact information: [place] [address] [phone]        Redmond School, MD. Call.  Specialty:  Internal Medicine Why:  Hospital follow-up in one to 2 weeks Contact information: [place] [address] [phone]           Signed: Reyne Dumas 03/24/2017, 11:31 AM        Time spent >45 mins

## 2017-03-24 NOTE — Transfer of Care (Signed)
Immediate Anesthesia Transfer of Care Note  Patient: Deanna Berry  Procedure(s) Performed: Procedure(s): ESOPHAGOGASTRODUODENOSCOPY (EGD) WITH PROPOFOL (N/A)  Patient Location: Endoscopy Unit  Anesthesia Type:MAC  Level of Consciousness: awake, alert  and oriented  Airway & Oxygen Therapy: Patient Spontanous Breathing  Post-op Assessment: Report given to RN, Post -op Vital signs reviewed and stable and Patient moving all extremities X 4  Post vital signs: Reviewed and stable  Last Vitals:  Vitals:   03/24/17 0439 03/24/17 1206  BP: 121/77 (!) 163/95  Pulse: 76 73  Resp: 16 14  Temp: 36.4 C 36.8 C    Last Pain:  Vitals:   03/24/17 1206  TempSrc: Oral  PainSc:       Patients Stated Pain Goal: 4 (92/49/32 4199)  Complications: No apparent anesthesia complications

## 2017-03-24 NOTE — Progress Notes (Signed)
Occupational Therapy Treatment Patient Details Name: Deanna Berry MRN: [id number] DOB: 05-14-1971 Today's Date: 03/24/2017    History of present illness 46 y.o. female admitted to [facility] on 03/21/17 for fall, imbalance.  Pt dx with acute kidney injury, imbalance/ataxia MRI negative for acute findings, hypokalemia, elevated billirubin, anemia (chronic) normocytic (supplementing with iron), and hydronephrosis on CT scan.  UDS negative. Pt with singificant PMhx of substance abuse, PUD, HA, cleft palate, and bipolar d/o.   OT comments  Pt making progress towards OT goals this session. Pt able to implement energy conservation strategies during sink level grooming, sponge bath at sink, and dressing. Pt continues to benefit from skilled OT in the acute setting to reinforce safety and independence in ADL and functional transfers.   Follow Up Recommendations  No OT follow up;Supervision - Intermittent    Equipment Recommendations  3 in 1 bedside commode    Recommendations for Other Services      Precautions / Restrictions Precautions Precautions: Fall Precaution Comments: recent h/o falls Restrictions Weight Bearing Restrictions: No       Mobility Bed Mobility Overal bed mobility: Independent                Transfers Overall transfer level: Needs assistance Equipment used: None Transfers: Sit to/from Stand Sit to Stand: Supervision         General transfer comment: supervision for safety. Pt reports general fatigue, no lightheadedness.    Balance Overall balance assessment: Needs assistance Sitting-balance support: Feet supported;No upper extremity supported Sitting balance-Leahy Scale: Normal Sitting balance - Comments: able to don/doff socks sitting EOB   Standing balance support: No upper extremity supported Standing balance-Leahy Scale: Good Standing balance comment: able to stand at sink, leans against surface for balance                           ADL  either performed or assessed with clinical judgement   ADL Overall ADL's : Needs assistance/impaired Eating/Feeding: NPO   Grooming: Supervision/safety;Standing;Wash/dry hands;Oral care;Wash/dry face;Applying deodorant Grooming Details (indicate cue type and reason): sink level, but Pt implementing energy conservation strategies prior during grooming Upper Body Bathing: Sitting;Supervision/ safety Upper Body Bathing Details (indicate cue type and reason): sitting at sink Lower Body Bathing: Supervison/ safety;Sit to/from stand Lower Body Bathing Details (indicate cue type and reason): able to wash entire LB without assist Upper Body Dressing : Supervision/safety;Standing   Lower Body Dressing: Supervision/safety;Sitting/lateral leans Lower Body Dressing Details (indicate cue type and reason): to don socks             Functional mobility during ADLs: Min guard General ADL Comments: Pt recalled 2 ways to conserve energy during ADL, reviewed handout     Vision   Vision Assessment?: No apparent visual deficits   Perception     Praxis      Cognition Arousal/Alertness: Awake/alert Behavior During Therapy: WFL for tasks assessed/performed Overall Cognitive Status: Within Functional Limits for tasks assessed                                          Exercises     Shoulder Instructions       General Comments      Pertinent Vitals/ Pain       Pain Assessment: 0-10 Pain Score: 7  Pain Location: upper abdomen/diaphragm region Pain Descriptors / Indicators: Discomfort Pain  Intervention(s): Premedicated before session;Monitored during session  Home Living                                          Prior Functioning/Environment              Frequency  Min 2X/week        Progress Toward Goals  OT Goals(current goals can now be found in the care plan section)  Progress towards OT goals: Progressing toward goals  Acute Rehab OT  Goals Patient Stated Goal: to figure out why these episodes keep happening.  OT Goal Formulation: With patient Time For Goal Achievement: 04/06/17 Potential to Achieve Goals: Good  Plan Discharge plan needs to be updated    Co-evaluation                 AM-PAC PT "6 Clicks" Daily Activity     Outcome Measure   Help from another person eating meals?: None Help from another person taking care of personal grooming?: None Help from another person toileting, which includes using toliet, bedpan, or urinal?: None Help from another person bathing (including washing, rinsing, drying)?: None Help from another person to put on and taking off regular upper body clothing?: None Help from another person to put on and taking off regular lower body clothing?: None 6 Click Score: 24    End of Session Equipment Utilized During Treatment: Gait belt  OT Visit Diagnosis: Unsteadiness on feet (R26.81);History of falling (Z91.81);Muscle weakness (generalized) (M62.81)   Activity Tolerance Patient tolerated treatment well   Patient Left in bed;with call bell/phone within reach;Other (comment) (MD present)   Nurse Communication Mobility status        Time: [phone] OT Time Calculation (min): 20 min  Charges: OT General Charges $OT Visit: 1 Procedure OT Treatments $Self Care/Home Management : 8-22 mins  Hulda Humphrey OTR/L [place] 03/24/2017, 9:07 AM

## 2017-03-24 NOTE — Anesthesia Postprocedure Evaluation (Signed)
Anesthesia Post Note  Patient: Deanna Berry  Procedure(s) Performed: Procedure(s) (LRB): ESOPHAGOGASTRODUODENOSCOPY (EGD) WITH PROPOFOL (N/A)  Patient location during evaluation: Endoscopy Anesthesia Type: MAC Level of consciousness: awake and alert Pain management: pain level controlled Vital Signs Assessment: post-procedure vital signs reviewed and stable Respiratory status: spontaneous breathing, nonlabored ventilation, respiratory function stable and patient connected to nasal cannula oxygen Cardiovascular status: stable and blood pressure returned to baseline Anesthetic complications: no       Last Vitals:  Vitals:   03/24/17 0439 03/24/17 1206  BP: 121/77 (!) 163/95  Pulse: 76 73  Resp: 16 14  Temp: 36.4 C 36.8 C    Last Pain:  Vitals:   03/24/17 1206  TempSrc: Oral  PainSc:                  [name]

## 2017-03-24 NOTE — Progress Notes (Signed)
[facility] Gastroenterology Progress Note  Deanna Berry 46 y.o. [birth year]  CC:  Anemia, epigastric pain   Subjective: No acute issues overnight. Continues to have epigastric pain. Denied vomiting. Denied black stool or bright red blood per rectum.  ROS : Negative for chest pain and shortness of breath.   Objective: Vital signs in last 24 hours: Vitals:   03/23/17 2308 03/24/17 0439  BP: (!) 143/89 121/77  Pulse: 72 76  Resp: 16 16  Temp: 98.7 F (37.1 C) 97.6 F (36.4 C)    Physical Exam:  Constitutional: She is oriented to person, place, and time. She appears well-developed and well-nourished. No distress.  Mouth/Throat: No oropharyngeal exudate.  Bruises over her right upper eyelid noted  Eyes: EOM are normal. No scleral icterus.  Cardiovascular: Normal rate, regular rhythm and normal heart sounds.   Pulmonary/Chest: Effort normal and breath sounds normal. No respiratory distress.  Abdominal: Soft. Bowel sounds are normal. She exhibits no distension. There is tenderness. There is no rebound and no guarding.  Epigastric tenderness to palpation  Musculoskeletal: She exhibits no edema or tenderness.  Neurological: She is alert and oriented to person, place, and time.  Skin: Skin is warm. Psychiatric: She has a normal mood and affect. Her behavior is normal    Lab Results:  Recent Labs  03/22/17 0214 03/23/17 0555 03/24/17 0526  NA 139 143 139  K 2.9* 5.5* 4.5  CL 110 119* 113*  CO2 20* 20* 19*  GLUCOSE 105* 81 84  BUN 11 9 9   CREATININE 1.78* 1.35* 1.29*  CALCIUM 8.0* 7.5* 7.7*  MG 1.9  --   --     Recent Labs  03/23/17 0555 03/24/17 0526  AST 14* 12*  ALT 9* 8*  ALKPHOS 49 44  BILITOT 1.9* 1.2  PROT 4.1* 4.1*  ALBUMIN 1.7* 1.8*    Recent Labs  03/21/17 1836  03/23/17 0555 03/24/17 0526  WBC 10.0  < > 10.4 8.8  NEUTROABS 6.1  --   --   --   HGB 8.1*  < > 6.9* 9.3*  HCT 26.3*  < > 22.9* 29.1*  MCV 84.6  < > 86.7 85.3  PLT 442*  < > 344 282  < >  = values in this interval not displayed. No results for input(s): LABPROT, INR in the last 72 hours.    Assessment/Plan: - Symptomatic anemia with hemoglobin of 6.9 now.  HgB was 8.4 last month. Prior hemoglobin normal. Iron studies in April showed iron deficiency anemia with low iron saturation. Occult blood was negative. - History of gastric and duodenal ulcer in 2016. Patient with ongoing use of pain medication. - Elevated total bilirubin. Probably Gilbert's syndrome. Ultrasound and recent CT showed no biliary ductal dilatation. - Follow with imbalance.  Recommendations -------------------------- - Hemoglobin improved after blood transfusion.  - EGD today for further evaluation. Risk benefits alternatives discussed with the patient. Verbalized understanding. - Patient was advised to avoid NSAIDs. Continue PPI for now. Further plan based on endoscopic finding. - Recommend outpatient follow-up with primary GI-  Dr. fields after discharge in 2-3 weeks.   Otis Brace MD, [place] 03/24/2017, 8:51 AM  Pager [phone]  If no answer or after 5 PM call [phone]

## 2017-03-24 NOTE — Op Note (Addendum)
[facility] Patient Name: Deanna Berry Procedure Date : 03/24/2017 MRN: [id number] Attending MD: Otis Brace , MD Date of Birth: 1971-03-22 CSN: [id number] Age: 46 Admit Type: Inpatient Procedure:                Upper GI endoscopy Indications:              Melena Providers:                Otis Brace, MD, Cleda Daub, RN, Cletis Athens, Technician, Alan Mulder, Technician,                            Garrison Columbus, CRNA Referring MD:              Medicines:                Sedation Administered by an Anesthesia Professional Complications:            No immediate complications. Estimated Blood Loss:     Estimated blood loss was minimal. Procedure:                Pre-Anesthesia Assessment:                           - Prior to the procedure, a History and Physical                            was performed, and patient medications and                            allergies were reviewed. The patient's tolerance of                            previous anesthesia was also reviewed. The risks                            and benefits of the procedure and the sedation                            options and risks were discussed with the patient.                            All questions were answered, and informed consent                            was obtained. Prior Anticoagulants: The patient has                            taken ibuprofen. ASA Grade Assessment: II - A                            patient with mild systemic disease. After reviewing  the risks and benefits, the patient was deemed in                            satisfactory condition to undergo the procedure.                           After obtaining informed consent, the endoscope was                            passed under direct vision. Throughout the                            procedure, the patient's blood pressure, pulse, and                            oxygen  saturations were monitored continuously. The                            [id number] ([id number]) scope was introduced through the                            mouth, with the intention of advancing to the                            stomach. The scope was advanced to the prepyloric                            antrum before the procedure was aborted.                            Medications were given. The upper GI endoscopy was                            accomplished without difficulty. The patient                            tolerated the procedure well. Scope In: Scope Out: Findings:      The Z-line was regular and was found 36 cm from the incisors.      A small hiatal hernia was present.      Scattered mild inflammation characterized by congestion (edema),       erosions, erythema and friability was found in the gastric body and in       the prepyloric region of the stomach. Biopsies were taken with a cold       forceps for histology.      The scope was advanced to the prepyloric are. There was large       clean-based pyloric channel ulcer with narrowing of the pyloric channel.       Patient started having contact oozing . Scope was not advanced beyound       this area.      A benign-appearing, intrinsic severe stenosis was found at the pylorus.       This was non-traversed.      Bilious fluid was found in the gastric body. Impression:               -  Z-line regular, 36 cm from the incisors.                           - Small hiatal hernia.                           - Non-bleeding gastric ulcer with a clean ulcer                            base (Forrest Class III).                           - Gastritis. Biopsied. Moderate Sedation:      Moderate (conscious) sedation was personally administered by an       anesthesia professional. The following parameters were monitored: oxygen       saturation, heart rate, blood pressure, and response to care. Recommendation:           - Return patient to  hospital ward for ongoing care.                           - Soft diet.                           - No aspirin, ibuprofen, naproxen, or other                            non-steroidal anti-inflammatory drugs.                           - Use Protonix (pantoprazole) 40 mg PO BID.                           - Repeat upper endoscopy in 8 weeks to check                            healing.                           - Return to GI clinic in 3 weeks. Procedure Code(s):        --- Professional ---                           [phone], 60, Esophagogastroduodenoscopy, flexible,                            transoral; with biopsy, single or multiple Diagnosis Code(s):        --- Professional ---                           K44.9, Diaphragmatic hernia without obstruction or                            gangrene                           K25.9, Gastric ulcer, unspecified as acute or  chronic, without hemorrhage or perforation                           K29.70, Gastritis, unspecified, without bleeding                           K92.1, Melena (includes Hematochezia) CPT copyright 2016 [employer]. All rights reserved. The codes documented in this report are preliminary and upon coder review may  be revised to meet current compliance requirements. Otis Brace, MD Otis Brace, MD 03/24/2017 1:59:57 PM Number of Addenda: 0

## 2017-03-24 NOTE — Evaluation (Deleted)
Physical Therapy Evaluation Patient Details Name: Deanna Berry MRN: [id number] DOB: July 20, 1971 Today's Date: 03/24/2017   History of Present Illness  46 y.o. female admitted to [facility] on 03/21/17 for fall, imbalance.  Pt dx with acute kidney injury, imbalance/ataxia MRI negative for acute findings, hypokalemia, elevated billirubin, anemia (chronic) normocytic (supplementing with iron), and hydronephrosis on CT scan.  UDS negative. Pt with singificant PMhx of substance abuse, PUD, HA, cleft palate, and bipolar d/o.  Clinical Impression  Pt continuing to progress towards goals. Participated in higher level static and dynamic balance tasks this session. Continues to demonstrate unsteadiness and require supervision to occasional min A to maintain balance during higher level activities. Continues to be limited by fatigue and require standing rest breaks during activities. Will continue to follow acutely to maximize functional mobility independence and safety.     Follow Up Recommendations Outpatient PT    Equipment Recommendations  None recommended by PT    Recommendations for Other Services       Precautions / Restrictions Precautions Precautions: Fall Precaution Comments: recent h/o falls Restrictions Weight Bearing Restrictions: No      Mobility  Bed Mobility Overal bed mobility: Independent             General bed mobility comments: Sitting EOB upon entry   Transfers Overall transfer level: Needs assistance Equipment used: None Transfers: Sit to/from Stand Sit to Stand: Supervision         General transfer comment: supervision for safety. Pt reports general fatigue, no lightheadedness.  Ambulation/Gait Ambulation/Gait assistance: Supervision;Min assist Ambulation Distance (Feet): 150 Feet Assistive device: None Gait Pattern/deviations: Step-through pattern;Staggering left;Staggering right Gait velocity: decreased Gait velocity interpretation: Below normal speed for  age/gender General Gait Details: Practiced dynamic gait tasks during ambulation. Pt staggered to R with horizontal and vertical head turns, requiring min A for regaining balance. Pt slowed gait to navigate in between obstacles during gait. Became fatigued and requiring standing rest break on rail before returning to room.   Stairs            Wheelchair Mobility    Modified Rankin (Stroke Patients Only)       Balance Overall balance assessment: Needs assistance Sitting-balance support: Feet supported;No upper extremity supported Sitting balance-Leahy Scale: Normal Sitting balance - Comments: able to don/doff socks sitting EOB   Standing balance support: No upper extremity supported Standing balance-Leahy Scale: Fair Standing balance comment: able to walk on level surface without AD and supervision                              Pertinent Vitals/Pain Pain Assessment: Faces Pain Score: 7  Faces Pain Scale: Hurts even more Pain Location: upper abdomen/diaphragm region Pain Descriptors / Indicators: Discomfort;Grimacing Pain Intervention(s): Limited activity within patient's tolerance;Monitored during session;Repositioned    Home Living                        Prior Function                 Hand Dominance        Extremity/Trunk Assessment                Communication      Cognition Arousal/Alertness: Awake/alert Behavior During Therapy: WFL for tasks assessed/performed Overall Cognitive Status: Within Functional Limits for tasks assessed  General Comments General comments (skin integrity, edema, etc.): Pt reporting she does not feel well this session.     Exercises Other Exercises Other Exercises: static standing, Narrow BOS, eyes open. X 30 sec. Increased sway, but no LOB Other Exercises: static standing, narrow BOS, eyes closed, 10 sec X 2. Pt only able to hold for short  time periods, required min A to prevent LOB.  Other Exercises: static standing, semi tandem, X 25 sec. Increased sway, but no LOB noted.  Other Exercises: static standing, semi tandem, eyes closed, 5 sec X 2. Only able to hold 5 sec before LOB. Min A for steadying.    Assessment/Plan    PT Assessment    PT Problem List         PT Treatment Interventions      PT Goals (Current goals can be found in the Care Plan section)  Acute Rehab PT Goals Patient Stated Goal: to figure out why these episodes keep happening.  PT Goal Formulation: With patient Time For Goal Achievement: 04/05/17 Potential to Achieve Goals: Good    Frequency Min 3X/week   Barriers to discharge        Co-evaluation               AM-PAC PT "6 Clicks" Daily Activity  Outcome Measure Difficulty turning over in bed (including adjusting bedclothes, sheets and blankets)?: None Difficulty moving from lying on back to sitting on the side of the bed? : None Difficulty sitting down on and standing up from a chair with arms (e.g., wheelchair, bedside commode, etc,.)?: None Help needed moving to and from a bed to chair (including a wheelchair)?: None Help needed walking in hospital room?: None Help needed climbing 3-5 steps with a railing? : A Little 6 Click Score: 23    End of Session Equipment Utilized During Treatment: Gait belt Activity Tolerance: Patient limited by fatigue Patient left: in bed;with call bell/phone within reach;with bed alarm set Nurse Communication: Mobility status PT Visit Diagnosis: Unsteadiness on feet (R26.81);Difficulty in walking, not elsewhere classified (R26.2)    Time: 5859-2924 PT Time Calculation (min) (ACUTE ONLY): 12 min   Charges:     PT Treatments $Neuromuscular Re-education: 8-22 mins   PT G Codes:        Nicky Pugh, PT, DPT  Acute Rehabilitation Services  Pager: [phone]   Army Melia 03/24/2017, 9:45 AM

## 2017-03-24 NOTE — Anesthesia Procedure Notes (Signed)
Procedure Name: MAC Date/Time: 03/24/2017 1:35 PM Performed by: Garrison Columbus T Pre-anesthesia Checklist: Patient identified, Emergency Drugs available, Suction available and Patient being monitored Patient Re-evaluated:Patient Re-evaluated prior to inductionOxygen Delivery Method: Nasal cannula Preoxygenation: Pre-oxygenation with 100% oxygen Intubation Type: IV induction Placement Confirmation: positive ETCO2 and breath sounds checked- equal and bilateral Dental Injury: Teeth and Oropharynx as per pre-operative assessment

## 2017-03-24 NOTE — Progress Notes (Signed)
Physical Therapy Treatment Patient Details Name: Deanna Berry MRN: [id number] DOB: [birth year] Today's Date: 03/24/2017    History of Present Illness 46 y.o. female admitted to [facility] on 03/21/17 for fall, imbalance.  Pt dx with acute kidney injury, imbalance/ataxia MRI negative for acute findings, hypokalemia, elevated billirubin, anemia (chronic) normocytic (supplementing with iron), and hydronephrosis on CT scan.  UDS negative. Pt with singificant PMhx of substance abuse, PUD, HA, cleft palate, and bipolar d/o.    PT Comments    Pt continuing to progress towards goals. Participated in higher level static and dynamic balance tasks this session. Continues to demonstrate unsteadiness and require supervision to occasional min A to maintain balance during higher level activities. Continues to be limited by fatigue and require standing rest breaks during activities. Will continue to follow acutely to maximize functional mobility independence and safety.    Follow Up Recommendations  Outpatient PT     Equipment Recommendations  None recommended by PT    Recommendations for Other Services       Precautions / Restrictions Precautions Precautions: Fall Precaution Comments: recent h/o falls Restrictions Weight Bearing Restrictions: No    Mobility  Bed Mobility Overal bed mobility: Independent             General bed mobility comments: Sitting EOB upon entry   Transfers Overall transfer level: Needs assistance Equipment used: None Transfers: Sit to/from Stand Sit to Stand: Supervision         General transfer comment: supervision for safety. Pt reports general fatigue, no lightheadedness.  Ambulation/Gait Ambulation/Gait assistance: Supervision;Min assist Ambulation Distance (Feet): 150 Feet Assistive device: None Gait Pattern/deviations: Step-through pattern;Staggering left;Staggering right Gait velocity: decreased Gait velocity interpretation: Below normal speed for  age/gender General Gait Details: Practiced dynamic gait tasks during ambulation. Pt staggered to R with horizontal and vertical head turns, requiring min A for regaining balance. Pt slowed gait to navigate in between obstacles during gait. Became fatigued and requiring standing rest break on rail before returning to room.    Stairs            Wheelchair Mobility    Modified Rankin (Stroke Patients Only)       Balance Overall balance assessment: Needs assistance Sitting-balance support: Feet supported;No upper extremity supported Sitting balance-Leahy Scale: Normal Sitting balance - Comments: able to don/doff socks sitting EOB   Standing balance support: No upper extremity supported Standing balance-Leahy Scale: Fair Standing balance comment: able to walk on level surface without AD and supervision                             Cognition Arousal/Alertness: Awake/alert Behavior During Therapy: WFL for tasks assessed/performed Overall Cognitive Status: Within Functional Limits for tasks assessed                                        Exercises Other Exercises Other Exercises: static standing, Narrow BOS, eyes open. X 30 sec. Increased sway, but no LOB Other Exercises: static standing, narrow BOS, eyes closed, 10 sec X 2. Pt only able to hold for short time periods, required min A to prevent LOB.  Other Exercises: static standing, semi tandem, X 25 sec. Increased sway, but no LOB noted.  Other Exercises: static standing, semi tandem, eyes closed, 5 sec X 2. Only able to hold 5 sec before LOB. Min A for  steadying.     General Comments General comments (skin integrity, edema, etc.): Pt reporting she does not feel well this session.       Pertinent Vitals/Pain Pain Assessment: Faces Pain Score: 7  Faces Pain Scale: Hurts even more Pain Location: upper abdomen/diaphragm region Pain Descriptors / Indicators: Discomfort;Grimacing Pain  Intervention(s): Limited activity within patient's tolerance;Monitored during session;Repositioned    Home Living                      Prior Function            PT Goals (current goals can now be found in the care plan section) Acute Rehab PT Goals Patient Stated Goal: to figure out why these episodes keep happening.  PT Goal Formulation: With patient Time For Goal Achievement: 04/05/17 Potential to Achieve Goals: Good Progress towards PT goals: Progressing toward goals    Frequency    Min 3X/week      PT Plan Current plan remains appropriate    Co-evaluation              AM-PAC PT "6 Clicks" Daily Activity  Outcome Measure  Difficulty turning over in bed (including adjusting bedclothes, sheets and blankets)?: None Difficulty moving from lying on back to sitting on the side of the bed? : None Difficulty sitting down on and standing up from a chair with arms (e.g., wheelchair, bedside commode, etc,.)?: None Help needed moving to and from a bed to chair (including a wheelchair)?: None Help needed walking in hospital room?: None Help needed climbing 3-5 steps with a railing? : A Little 6 Click Score: 23    End of Session Equipment Utilized During Treatment: Gait belt Activity Tolerance: Patient limited by fatigue Patient left: in bed;with call bell/phone within reach;with bed alarm set Nurse Communication: Mobility status PT Visit Diagnosis: Unsteadiness on feet (R26.81);Difficulty in walking, not elsewhere classified (R26.2)     Time: 4098-1191 PT Time Calculation (min) (ACUTE ONLY): 12 min  Charges:  $Neuromuscular Re-education: 8-22 mins                    G Codes:       Nicky Pugh, PT, DPT  Acute Rehabilitation Services  Pager: [phone]    Army Melia 03/24/2017, 10:00 AM

## 2017-03-24 NOTE — Brief Op Note (Addendum)
03/21/2017 - 03/24/2017  2:00 PM  PATIENT:  Deanna Berry  46 y.o. female  PRE-OPERATIVE DIAGNOSIS:  Iron Deficiency anemia. Dark stools  POST-OPERATIVE DIAGNOSIS: pyloric ulcer  PROCEDURE:  Procedure(s): ESOPHAGOGASTRODUODENOSCOPY (EGD) WITH PROPOFOL (N/A)  SURGEON:  Surgeon(s) and Role:    * Caraline Deutschman, MD - Primary  Findings/recommendations --------------------------------------- - EGD showed large clean base pyloric channel ulcer with the narrowing of the pyloric channel. Not able to advance scope into the duodenum. No evidence of active bleeding. - Patient with history of prepyloric ulcer with pyloric channel narrowing based on EGD in 2016. - Change PPI to IV while in the hospital. Recommend by mouth PPI on discharge - Recommend repeat EGD in 6-8 weeks by primary GI-  Dr. Oneida Alar. - If she continues to have pyloric channel ulcer and pyloric stenosis after repeat EGD, she may need surgical evaluation. - Patient was advised to avoid NSAIDs. - Twice a day PPI at least for 2-3 months. - Advance diet as tolerated. GI will sign off. Call us back if needed. - Follow-up with primary GI - Dr. Oneida Alar in 3-4 weeks.    Otis Brace MD, [place] 03/24/2017, 2:03 PM  Pager [phone]  If no answer or after 5 PM call [phone]

## 2017-03-24 NOTE — Anesthesia Preprocedure Evaluation (Signed)
Anesthesia Evaluation    Reviewed: Allergy & Precautions, NPO status , Patient's Chart, lab work & pertinent test results  Airway        Dental  (+) Dental Advisory Given   Pulmonary Current Smoker,           Cardiovascular      Neuro/Psych  Headaches, PSYCHIATRIC DISORDERS Bipolar Disorder    GI/Hepatic PUD,   Endo/Other  Hypothyroidism   Renal/GU      Musculoskeletal   Abdominal   Peds  Hematology   Anesthesia Other Findings   Reproductive/Obstetrics                             Anesthesia Physical Anesthesia Plan  ASA: II  Anesthesia Plan: MAC   Post-op Pain Management:    Induction: Intravenous  Airway Management Planned: Natural Airway and Nasal Cannula  Additional Equipment:   Intra-op Plan:   Post-operative Plan:   Informed Consent: I have reviewed the patients History and Physical, chart, labs and discussed the procedure including the risks, benefits and alternatives for the proposed anesthesia with the patient or authorized representative who has indicated his/her understanding and acceptance.   Dental advisory given  Plan Discussed with: CRNA and Anesthesiologist  Anesthesia Plan Comments:         Anesthesia Quick Evaluation

## 2017-03-25 ENCOUNTER — Encounter (HOSPITAL_COMMUNITY): Payer: Self-pay | Admitting: Gastroenterology

## 2017-03-25 LAB — CBC
HCT: 31.5 % — ABNORMAL LOW (ref 36.0–46.0)
Hemoglobin: 10 g/dL — ABNORMAL LOW (ref 12.0–15.0)
MCH: 27.2 pg (ref 26.0–34.0)
MCHC: 31.7 g/dL (ref 30.0–36.0)
MCV: 85.8 fL (ref 78.0–100.0)
Platelets: 306 10*3/uL (ref 150–400)
RBC: 3.67 MIL/uL — ABNORMAL LOW (ref 3.87–5.11)
RDW: 17 % — AB (ref 11.5–15.5)
WBC: 9.2 10*3/uL (ref 4.0–10.5)

## 2017-03-25 NOTE — Progress Notes (Signed)
Nsg Discharge Note  Admit Date:  03/21/2017 Discharge date: 03/25/2017   Bralynn Velador to be D/C'd Home per MD order.  AVS completed.  Copy for chart, and copy for patient signed, and dated. Patient/caregiver able to verbalize understanding.  Discharge Medication: Allergies as of 03/25/2017      Reactions   Amoxil [amoxicillin] Hives   Adhesive [tape] Rash   Neosporin Original [bacitracin-neomycin-polymyxin] Rash      Medication List    TAKE these medications   carbamazepine 200 MG 12 hr tablet Commonly known as:  TEGRETOL XR Take 600 mg by mouth 2 (two) times daily.   gabapentin 800 MG tablet Commonly known as:  NEURONTIN Take 800 mg by mouth 3 (three) times daily.   levothyroxine 75 MCG tablet Commonly known as:  SYNTHROID, LEVOTHROID Take 75 mcg by mouth daily.   LINZESS 290 MCG Caps capsule Generic drug:  linaclotide Take 290 mcg by mouth daily before breakfast.   pantoprazole 40 MG tablet Commonly known as:  PROTONIX Take 1 tablet (40 mg total) by mouth 2 (two) times daily before a meal. What changed:  when to take this  additional instructions   QUEtiapine 300 MG 24 hr tablet Commonly known as:  SEROQUEL XR Take 300 mg by mouth at bedtime.   TRINTELLIX 20 MG Tabs Generic drug:  vortioxetine HBr Take 20 mg by mouth daily.            Durable Medical Equipment        Start     Ordered   03/24/17 1129  For home use only DME 3 n 1  Once     03/24/17 1128      Discharge Assessment: Vitals:   03/24/17 2200 03/25/17 0604  BP: (!) 141/88 123/70  Pulse: 72 76  Resp:  20  Temp:  98.4 F (36.9 C)   Skin clean, dry and intact without evidence of skin break down, no evidence of skin tears noted. IV catheter discontinued intact. Site without signs and symptoms of complications - no redness or edema noted at insertion site, patient denies c/o pain - only slight tenderness at site.  Dressing with slight pressure applied.  D/c  Instructions-Education: Discharge instructions given to patient/family with verbalized understanding. D/c education completed with patient/family including follow up instructions, medication list, d/c activities limitations if indicated, with other d/c instructions as indicated by MD - patient able to verbalize understanding, all questions fully answered. Patient instructed to return to ED, call 911, or call MD for any changes in condition.  Patient escorted via [place], and D/C home via private auto.  Salley Slaughter, RN 03/25/2017 1:11 PM

## 2017-03-25 NOTE — Care Management Note (Signed)
Case Management Note  Patient Details  Name: Deanna Berry MRN: [id number] Date of Birth: [birth year]  Subjective/Objective:                 Patient to DC to home. Declined BSC. OP PT OT referral previously made by CM during the week, refer to CM notes.    Action/Plan:   Expected Discharge Date:  03/25/17               Expected Discharge Plan:  OP Rehab  In-House Referral:     Discharge planning Services  CM Consult  Post Acute Care Choice:    Choice offered to:     DME Arranged:    DME Agency:     HH Arranged:    HH Agency:     Status of Service:  Completed, signed off  If discussed at [employer] of Stay Meetings, dates discussed:    Additional Comments:  Carles Collet, RN 03/25/2017, 11:47 AM

## 2017-03-25 NOTE — Discharge Summary (Signed)
Physician Discharge Summary  [name] MRN: [id number] DOB/AGE: Aug 28, 1971 46 y.o.  PCP: Redmond School, MD   Admit date: 03/21/2017 Discharge date: 03/25/2017  Discharge Diagnoses:    Principal Problem:   AKI (acute kidney injury) ([place]) Active Problems:   Bipolar 1 disorder ([name])   Anemia   Constipation   Hypokalemia   Hyperbilirubinemia   Ataxia   Hypothyroidism   Acute cystitis without hematuria                                   Follow-up recommendations Follow-up with PCP in 3-5 days , including all  additional recommended appointments as below Follow-up CBC, CMP in 3-5 days follow-up with primary GI-  Dr. Oneida Alar after discharge in 2-3 weeks Patient instructed not to drive without supervision     Current Discharge Medication List    CONTINUE these medications which have CHANGED   Details  pantoprazole (PROTONIX) 40 MG tablet Take 1 tablet (40 mg total) by mouth 2 (two) times daily before a meal. Qty: 60 tablet, Refills: 2      CONTINUE these medications which have NOT CHANGED   Details  carbamazepine (TEGRETOL XR) 200 MG 12 hr tablet Take 600 mg by mouth 2 (two) times daily.  Refills: 0    gabapentin (NEURONTIN) 800 MG tablet Take 800 mg by mouth 3 (three) times daily. Refills: 0    linaclotide (LINZESS) 290 MCG CAPS capsule Take 290 mcg by mouth daily before breakfast.    QUEtiapine (SEROQUEL XR) 300 MG 24 hr tablet Take 300 mg by mouth at bedtime.  Refills: 0    TRINTELLIX 20 MG TABS Take 20 mg by mouth daily. Refills: 0    levothyroxine (SYNTHROID, LEVOTHROID) 75 MCG tablet Take 75 mcg by mouth daily. Refills: 0         Discharge Condition: Prognosis guarded, fall precautions,   Discharge Instructions Get Medicines reviewed and adjusted: Please take all your medications with you for your next visit with your Primary MD  Please request your Primary MD to go over all hospital tests and procedure/radiological results at the follow up, please  ask your Primary MD to get all Hospital records sent to his/her office.  If you experience worsening of your admission symptoms, develop shortness of breath, life threatening emergency, suicidal or homicidal thoughts you must seek medical attention immediately by calling 911 or calling your MD immediately if symptoms less severe.  You must read complete instructions/literature along with all the possible adverse reactions/side effects for all the Medicines you take and that have been prescribed to you. Take any new Medicines after you have completely understood and accpet all the possible adverse reactions/side effects.   Do not drive when taking Pain medications.   Do not take more than prescribed Pain, Sleep and Anxiety Medications  Special Instructions: If you have smoked or chewed Tobacco in the last 2 yrs please stop smoking, stop any regular Alcohol and or any Recreational drug use.  Wear Seat belts while driving.  Please note  You were cared for by a hospitalist during your hospital stay. Once you are discharged, your primary care physician will handle any further medical issues. Please note that NO REFILLS for any discharge medications will be authorized once you are discharged, as it is imperative that you return to your primary care physician (or establish a relationship with a primary care physician if you do not have  one) for your aftercare needs so that they can reassess your need for medications and monitor your lab values.  Discharge Instructions    Ambulatory referral to Occupational Therapy    Complete by:  As directed    Ambulatory referral to Physical Therapy    Complete by:  As directed        Allergies  Allergen Reactions  . Amoxil [Amoxicillin] Hives  . Adhesive [Tape] Rash  . Neosporin Original [Bacitracin-Neomycin-Polymyxin] Rash      Disposition: 01-Home or Self Care   Consults: * GI    Significant Diagnostic Studies:  Dg Chest 2 View  Result  Date: 03/21/2017 CLINICAL DATA:  Altered mental status.  Multiple falls today. EXAM: CHEST  2 VIEW COMPARISON:  Chest radiograph Mar 24, 2015 FINDINGS: Cardiomediastinal silhouette is normal. No pleural effusions or focal consolidations. Trachea projects midline and there is no pneumothorax. Soft tissue planes and included osseous structures are non-suspicious. IMPRESSION: Normal chest. Electronically Signed   By: Elon Alas M.D.   On: 03/21/2017 18:21   Ct Head Wo Contrast  Result Date: 03/21/2017 CLINICAL DATA:  Altered mental status. Speech change and imbalance. 2 falls. Laceration about the orbits. History of cleft palate. Initial encounter. EXAM: CT HEAD AND ORBITS WITHOUT CONTRAST TECHNIQUE: Contiguous axial images were obtained from the base of the skull through the vertex without contrast. Multidetector CT imaging of the orbits was performed using the standard protocol without intravenous contrast. COMPARISON:  Head CT 03/24/2015. FINDINGS: CT HEAD FINDINGS Brain: There is no evidence of acute infarct, intracranial hemorrhage, mass, midline shift, or extra-axial fluid collection. The ventricles and sulci are normal. Prominent extra-axial CSF space at the level of the suprasellar basilar cistern measures 3.5 x 1.5 cm and may represent an incidental arachnoid cyst, unchanged. Vascular: Minimal bilateral carotid siphon calcification. No hyperdense vessel. Skull: No fracture or focal osseous lesion. Other: None. CT ORBITS FINDINGS Orbits: The globes appear intact. The orbital fat is normal in appearance without evidence of hematoma or mass. The extraocular muscles and optic nerve complexes are symmetric and normal in appearance. No acute fracture is identified. Visualized sinuses: Postsurgical changes in the paranasal sinuses and nasal cavity. Left greater than right maxillary sinus osteitis consistent with chronic sinusitis. Mild mucosal thickening and bubbly fluid in the right maxillary sinus. Large  bilateral mastoid effusions. Right middle ear opacification. Soft tissues: Mild right periorbital soft tissue swelling. IMPRESSION: 1. No evidence of acute intracranial abnormality. 2. Mild right periorbital soft tissue swelling without evidence of acute orbital fracture. 3. Chronic sinusitis with possible superimposed acute right maxillary sinusitis. 4. Large mastoid effusions with right middle ear opacification. Electronically Signed   By: Logan Bores M.D.   On: 03/21/2017 18:46   Mr Brain Wo Contrast  Result Date: 03/22/2017 CLINICAL DATA:  Initial evaluation for acute fall. EXAM: MRI HEAD WITHOUT CONTRAST TECHNIQUE: Multiplanar, multiecho pulse sequences of the brain and surrounding structures were obtained without intravenous contrast. COMPARISON:  None.  Prior CT from 03/21/2017. FINDINGS: Brain: Cerebral volume within normal limits for age. No focal parenchymal signal abnormality identified. No significant cerebral white matter disease. No abnormal foci of restricted diffusion to suggest acute or subacute ischemia. Gray-white matter differentiation maintained. No areas of chronic infarction. No evidence for acute or chronic intracranial hemorrhage. No mass lesion, midline shift, or mass effect. No hydrocephalus. Benign arachnoid cyst at the superior cerebellar cistern noted with mild mass effect on the subjacent cerebellar vermis. No other extra-axial fluid collection. Major dural  sinuses are grossly patent. Pituitary gland suprasellar region within normal limits. Midline structures intact and normal. Vascular: Major intracranial vascular flow voids are maintained. Skull and upper cervical spine: Craniocervical junction normal. Visualized upper cervical spine unremarkable. Bone marrow signal intensity within normal limits. Possible trace right periorbital contusion noted. Scalp soft tissues otherwise unremarkable. Sinuses/Orbits: Globes and orbital soft tissues within normal limits. Mucosal thickening  within the maxillary sinuses, likely chronic. Paranasal sinuses are otherwise clear. Bilateral mastoid effusions. Other: None. IMPRESSION: 1. Negative brain MRI.  No acute intracranial process identified. 2. Chronic maxillary sinusitis. 3. Bilateral mastoid effusions. Electronically Signed   By: Jeannine Boga M.D.   On: 03/22/2017 05:50   US Abdomen Complete  Result Date: 03/22/2017 CLINICAL DATA:  Smoker, acute kidney injury, elevated total bilirubin, history substance abuse EXAM: ABDOMEN ULTRASOUND COMPLETE COMPARISON:  CT abdomen and pelvis 02/23/2017 FINDINGS: Gallbladder: Distended, at least 10.2 cm length and 4.8 cm transverse. No definite gallstones, wall thickening, pericholecystic fluid or sonographic Murphy sign. Common bile duct: Diameter: 5 mm diameter, normal Liver: Normal appearance IVC: Normal appearance Pancreas: Normal appearance Spleen: Normal appearance, 5.1 cm length Right Kidney: Length: 10.1 cm. Normal morphology without mass or hydronephrosis. Left Kidney: Length: 10.0 cm. Normal morphology without mass or hydronephrosis. Abdominal aorta: Visualized portion normal caliber, distal aorta and bifurcation obscured by bowel gas Other findings: No free fluid IMPRESSION: Distended gallbladder without stones or wall thickening. Otherwise negative exam. Electronically Signed   By: Lavonia Dana M.D.   On: 03/22/2017 08:15   Ct Abdomen Pelvis W Contrast  Result Date: 02/23/2017 CLINICAL DATA:  Worsening constipation with abdominal distention and tenderness. Nausea and vomiting this morning. EXAM: CT ABDOMEN AND PELVIS WITH CONTRAST TECHNIQUE: Multidetector CT imaging of the abdomen and pelvis was performed using the standard protocol following bolus administration of intravenous contrast. CONTRAST:  52m ISOVUE-300 IOPAMIDOL (ISOVUE-300) INJECTION 61% COMPARISON:  02/02/2017 FINDINGS: Lower chest:  Unremarkable. Hepatobiliary: No focal abnormality within the liver parenchyma. Mild  intrahepatic biliary duct distention is stable. Marked distention of the gallbladder is stable. No substantial extrahepatic biliary duct dilatation. Pancreas: No focal mass lesion. No dilatation of the main duct. No intraparenchymal cyst. No peripancreatic edema. Spleen: No splenomegaly. No focal mass lesion. Adrenals/Urinary Tract: No adrenal nodule or mass. Cortical thinning noted in the kidneys bilaterally with areas of segmental a decreased cortical perfusion bilaterally. This appearance can be seen in the setting of pyelonephritis. There is mild right hydroureteronephrosis, new in the interval with distention of the right ureter down to the distal right ureter/right UVJ where asymmetric soft tissue attenuation/ enhancement is identified when compared to the left. Stomach/Bowel: Stomach is nondistended. No gastric wall thickening. No evidence of outlet obstruction. Duodenum is normally positioned as is the ligament of Treitz. No small bowel wall thickening. No small bowel dilatation. The terminal ileum is normal. The appendix is normal. Colon is prominently stool distended throughout. Vascular/Lymphatic: There is abdominal aortic atherosclerosis without aneurysm. Scattered small lymph nodes identified in the root of the small bowel mesentery. No retroperitoneal lymphadenopathy. No pelvic sidewall lymphadenopathy. Reproductive: The uterus has normal CT imaging appearance. There is no adnexal mass. Other: No intraperitoneal free fluid. Musculoskeletal: Bone windows reveal no worrisome lytic or sclerotic osseous lesions. IMPRESSION: 1. Persistent segmentally decreased perfusion or segmental edema in the kidneys bilaterally, similar to prior study. As before, this CT imaging appearance does raise the question of pyelonephritis. Since the previous study the patient has developed mild right hydroureteronephrosis and there is decreased  excretion from the right kidney consistent with obstructive uropathy. The right  ureter is dilated into the pelvis where there is abnormal soft tissue/enhancement in the distal right ureter and right UVJ. 2. Persistent distended and stool-filled colon. Imaging features would be compatible with constipation in the appropriate clinical setting. 3. Persistent marked gallbladder distention, nonspecific. Electronically Signed   By: Misty Stanley M.D.   On: 02/23/2017 15:00   Ct Orbits Wo Contrast  Result Date: 03/21/2017 CLINICAL DATA:  Altered mental status. Speech change and imbalance. 2 falls. Laceration about the orbits. History of cleft palate. Initial encounter. EXAM: CT HEAD AND ORBITS WITHOUT CONTRAST TECHNIQUE: Contiguous axial images were obtained from the base of the skull through the vertex without contrast. Multidetector CT imaging of the orbits was performed using the standard protocol without intravenous contrast. COMPARISON:  Head CT 03/24/2015. FINDINGS: CT HEAD FINDINGS Brain: There is no evidence of acute infarct, intracranial hemorrhage, mass, midline shift, or extra-axial fluid collection. The ventricles and sulci are normal. Prominent extra-axial CSF space at the level of the suprasellar basilar cistern measures 3.5 x 1.5 cm and may represent an incidental arachnoid cyst, unchanged. Vascular: Minimal bilateral carotid siphon calcification. No hyperdense vessel. Skull: No fracture or focal osseous lesion. Other: None. CT ORBITS FINDINGS Orbits: The globes appear intact. The orbital fat is normal in appearance without evidence of hematoma or mass. The extraocular muscles and optic nerve complexes are symmetric and normal in appearance. No acute fracture is identified. Visualized sinuses: Postsurgical changes in the paranasal sinuses and nasal cavity. Left greater than right maxillary sinus osteitis consistent with chronic sinusitis. Mild mucosal thickening and bubbly fluid in the right maxillary sinus. Large bilateral mastoid effusions. Right middle ear opacification. Soft  tissues: Mild right periorbital soft tissue swelling. IMPRESSION: 1. No evidence of acute intracranial abnormality. 2. Mild right periorbital soft tissue swelling without evidence of acute orbital fracture. 3. Chronic sinusitis with possible superimposed acute right maxillary sinusitis. 4. Large mastoid effusions with right middle ear opacification. Electronically Signed   By: Logan Bores M.D.   On: 03/21/2017 18:46        Filed Weights   03/21/17 1651 03/22/17 0225 03/24/17 1206  Weight: 51.7 kg (114 lb) 53.4 kg (117 lb 12.8 oz) 53.1 kg (117 lb)     Microbiology: Recent Results (from the past 240 hour(s))  Culture, Urine     Status: Abnormal   Collection Time: 03/21/17  9:50 PM  Result Value Ref Range Status   Specimen Description URINE, RANDOM  Final   Special Requests ADDED [id number] [phone]  Final   Culture (A)  Final    >=100,000 COLONIES/mL LACTOBACILLUS SPECIES Standardized susceptibility testing for this organism is not available.    Report Status 03/23/2017 FINAL  Final       Blood Culture    Component Value Date/Time   SDES URINE, RANDOM 03/21/2017 2150   [place] [id number] 03/21/2017 2150   CULT (A) 03/21/2017 2150    >=100,000 COLONIES/mL LACTOBACILLUS SPECIES Standardized susceptibility testing for this organism is not available.    REPTSTATUS 03/23/2017 FINAL 03/21/2017 2150      Labs: Results for orders placed or performed during the hospital encounter of 03/21/17 (from the past 48 hour(s))  CBC     Status: Abnormal   Collection Time: 03/24/17  5:26 AM  Result Value Ref Range   WBC 8.8 4.0 - 10.5 K/uL   RBC 3.41 (L) 3.87 - 5.11 MIL/uL   Hemoglobin 9.3 (L)  12.0 - 15.0 g/dL    Comment: DELTA CHECK NOTED REPEATED TO VERIFY POST TRANSFUSION SPECIMEN    HCT 29.1 (L) 36.0 - 46.0 %   MCV 85.3 78.0 - 100.0 fL   MCH 26.7 26.0 - 34.0 pg   MCHC 31.3 30.0 - 36.0 g/dL   RDW 16.4 (H) 11.5 - 15.5 %   Platelets 282 150 - 400 K/uL  Comprehensive  metabolic panel     Status: Abnormal   Collection Time: 03/24/17  5:26 AM  Result Value Ref Range   Sodium 139 135 - 145 mmol/L   Potassium 4.5 3.5 - 5.1 mmol/L    Comment: DELTA CHECK NOTED   Chloride 113 (H) 101 - 111 mmol/L   CO2 19 (L) 22 - 32 mmol/L   Glucose, Bld 84 65 - 99 mg/dL   BUN 9 6 - 20 mg/dL   Creatinine, Ser 1.29 (H) 0.44 - 1.00 mg/dL   Calcium 7.7 (L) 8.9 - 10.3 mg/dL   Total Protein 4.1 (L) 6.5 - 8.1 g/dL   Albumin 1.8 (L) 3.5 - 5.0 g/dL   AST 12 (L) 15 - 41 U/L   ALT 8 (L) 14 - 54 U/L   Alkaline Phosphatase 44 38 - 126 U/L   Total Bilirubin 1.2 0.3 - 1.2 mg/dL   GFR calc non Af Amer 49 (L) >60 mL/min   GFR calc Af Amer 57 (L) >60 mL/min    Comment: (NOTE) The eGFR has been calculated using the CKD EPI equation. This calculation has not been validated in all clinical situations. eGFR's persistently <60 mL/min signify possible Chronic Kidney Disease.    Anion gap 7 5 - 15  CBC     Status: Abnormal   Collection Time: 03/25/17  4:09 AM  Result Value Ref Range   WBC 9.2 4.0 - 10.5 K/uL   RBC 3.67 (L) 3.87 - 5.11 MIL/uL   Hemoglobin 10.0 (L) 12.0 - 15.0 g/dL   HCT 31.5 (L) 36.0 - 46.0 %   MCV 85.8 78.0 - 100.0 fL   MCH 27.2 26.0 - 34.0 pg   MCHC 31.7 30.0 - 36.0 g/dL   RDW 17.0 (H) 11.5 - 15.5 %   Platelets 306 150 - 400 K/uL       Lab Results  Component Value Date   CREATININE 1.29 (H) 03/24/2017     HPI :   Deanna Berry a 46 y.o.femalewith a past medical history significant for bipolar disorderwho presents with fall.  The patient was initially sent over from Psychiatrist's office because of fall. She reports feeling her normal self today, going to her regular appointment, concluding the appointment as usual and then when she went to stand up, she felt "off-balance" and fell over.Found to have Na 139, K 3.0, Cr 1.93(baseline 1.4), WBC 10K, Hgb 8.1 . Blood pressure soft. Patient admits to poor oral intake.She was seen at [facility] for  constipation and weakness. She was prescribed Amitiza with improvement in constipation. She was found to have anemia. According to patient see has been having on and off dark stools but denied any black tarry stool or bright red blood per rectum. EGD in May 2016 showed esophagitis and to small antral ulcers. Also showed large ulcer in the duodenal bulb. Repeat EGD in August 2016 showed more pyloric channel ulcer with mild pyloric stenosis. Previous gastric and duodenal ulcer were completely healed.  Colonoscopy in September 2016 normal.  Patient was seen by GI nurse practitioner at  [facility]  gastroenterology associate in April 2018.   HOSPITAL COURSE:   1. Acute kidney injury: In setting of NSAID use for chronic pain.versus poor oral intake for several days -Counseled to avoid NSAIDs  Renal function has improved, likely prerenal, DC IV fluids -Renal ultrasound unremarkable Follow renal function, improving 1.93>1.78>1.35>1.29    2. Imbalance/ataxia: Suspect this is side effect from psychiatric medications in setting of AKI.  Versus worsening anemia Telemetry uneventful, carbamazepine level within normal limits, negative EtOH, UDS negative Patient found to be slightly orthostatic this admission which could be related to poor oral intake/anemia  MR brain negative  3. Hypokalemia: Potassium repleted, magnesium 1.9  4. Elevated bilirubin:3.3>19 CT showed dilated GB last month. Currently abdomen exam benign.Ultrasound benign -  5. Anemia, normocytic: Iron deficient at labs drawn this month. Got Feraheme at GI office. Will repeat No report of melena, hematemesis, hematochezia. Hemoglobin has trended down from 8.1-6.9,  status post 2 units of packed red blood cells,  EGD dated May 2016. This showed esophagitis, 2 medium-sized ulcers in the antrum and at pylorus, large ulcer in duodenal bulb, hiatal hernia [facility] consulted for repeat EGD?  EGD Z-line regular, 36 cm  from the incisors.                           - Small hiatal hernia.                           - Non-bleeding gastric ulcer with a clean ulcer base (Forrest Class III).                           - Gastritis. Biopsied                              Repeat upper endoscopy in 8 weeks to check  healing.                           - Return to GI clinic in 3 weeks. Continue PPI twice a day Patient to follow-up with Dr. Oneida Alar in [place]   6. Hypothyroidism: Recently started on levothyroxine -Continue levothyroxine  7. Hydronephrosis on CT: UA shows pyuria and crystals. Urine culture  >=100,000 COLONIES/mL LACTOBACILLUS SPECIES  Renal US within normal limits    Discharge Exam:   Blood pressure 123/70, pulse 76, temperature 98.4 F (36.9 C), temperature source Oral, resp. rate 20, height 4' 11"  (1.499 m), weight 53.1 kg (117 lb), last menstrual period 02/24/2017, SpO2 97 %. Bruises over her right upper eyelid noted  Eyes: EOM are normal. No scleral icterus.  Neck: Normal range of motion. Neck supple. No thyromegaly present.  Cardiovascular: Normal rate, regular rhythm and normal heart sounds.   Pulmonary/Chest: Effort normal and breath sounds normal. No respiratory distress.  Abdominal: Soft. Bowel sounds are normal. She exhibits no distension. There is tenderness. There is no rebound and no guarding.  Epigastric tenderness to palpation  Musculoskeletal: She exhibits no edema or tenderness.  Neurological: She is alert and oriented to person, place, and time     Follow-up Information    [facility] Follow up.   Specialty:  Rehabilitation Why:  Outpatiet PT and OT arranged , office will call to set up appointments Contact information: [place]  [phone] mc [place] [phone]       Danie Binder, MD. Call.   Specialty:  Gastroenterology Why:  Follow-up with GI in 2-3 weeks. Call to schedule  appointment Contact information: [place] [address] [phone]        Redmond School, MD. Call.   Specialty:  Internal Medicine Why:  Hospital follow-up in one to 2 weeks Contact information: [address] [place] [address] [phone]           Signed: Reyne Dumas 03/25/2017, 10:04 AM        Time spent >45 mins

## 2017-03-28 ENCOUNTER — Other Ambulatory Visit: Payer: Self-pay | Admitting: Gastroenterology

## 2017-03-28 ENCOUNTER — Telehealth: Payer: Self-pay | Admitting: Gastroenterology

## 2017-03-28 ENCOUNTER — Encounter: Payer: Self-pay | Admitting: Gastroenterology

## 2017-03-28 MED ORDER — LINACLOTIDE 290 MCG PO CAPS: 290.0000 ug | ORAL_CAPSULE | Freq: Every day | ORAL | 3 refills | Status: AC

## 2017-03-28 NOTE — Telephone Encounter (Signed)
Pt called wanting to speak with DS. She said that her medicines are working, but she was needing either more samples or a prescription on  LInzess. Please call her at [phone]

## 2017-03-28 NOTE — Telephone Encounter (Signed)
PT needs Rx for the Linzess 290 mcg.  Also, she said to let Roseanne Kaufman, NP know that she was in the hospital at [facility] from 03/21/2017 til 03/25/2017. She had an EGD and had a large ulcer. She said the Protonix is not helping and she needs another medication.  Also, needs appt here for follow up.  Vicente Males, please advise!

## 2017-03-28 NOTE — Telephone Encounter (Signed)
Pt is aware how to take the pantoprazole.

## 2017-03-28 NOTE — Telephone Encounter (Signed)
Sent in prescription.  I AM SO GLAD she had the EGD. That was the next step after reviewing her records, so this explains IDA. Her last colonoscopy was fairly recent. She will need a surveillance EGD with Dr. Oneida Alar, but we can address that at her next office visit. Please have her follow-up with me in next 2-4 weeks.   She needs to take Protonix BID, if she is not already doing so. What does she mean it is not helping?

## 2017-03-28 NOTE — Telephone Encounter (Signed)
Yes, that's what she needs due to history of ulcer. Take 30 minutes before breakfast and dinner.

## 2017-03-28 NOTE — Telephone Encounter (Signed)
PT is aware, said she has not been taking bid but she will try it. Forwarding to [place] to schedule OV appt.

## 2017-03-28 NOTE — Telephone Encounter (Signed)
APPT MADE AND LETTER SENT  °

## 2017-03-30 NOTE — Telephone Encounter (Signed)
Refill was sent to the patient's pharmacy. Please ensure the patient is taking Protonix twice a day based on recommendations made after her upper endoscopy. I sent in enough to last several months at twice a day dosing. Call if any questions.

## 2017-03-31 NOTE — Telephone Encounter (Signed)
PT is aware.

## 2017-04-03 ENCOUNTER — Ambulatory Visit: Payer: BLUE CROSS/BLUE SHIELD | Admitting: Gastroenterology

## 2017-04-27 DIAGNOSIS — D649 Anemia, unspecified: Secondary | ICD-10-CM | POA: Diagnosis not present

## 2017-04-27 LAB — CBC WITH DIFFERENTIAL/PLATELET
BASOS ABS: 81 {cells}/uL (ref 0–200)
BASOS PCT: 1 %
EOS ABS: 405 {cells}/uL (ref 15–500)
Eosinophils Relative: 5 %
HCT: 41.4 % (ref 35.0–45.0)
HEMOGLOBIN: 13.2 g/dL (ref 11.7–15.5)
LYMPHS ABS: 2187 {cells}/uL (ref 850–3900)
Lymphocytes Relative: 27 %
MCH: 26.9 pg — AB (ref 27.0–33.0)
MCHC: 31.9 g/dL — ABNORMAL LOW (ref 32.0–36.0)
MCV: 84.5 fL (ref 80.0–100.0)
MONO ABS: 567 {cells}/uL (ref 200–950)
MONOS PCT: 7 %
MPV: 10.9 fL (ref 7.5–12.5)
Neutro Abs: 4860 cells/uL (ref 1500–7800)
Neutrophils Relative %: 60 %
Platelets: 354 10*3/uL (ref 140–400)
RBC: 4.9 MIL/uL (ref 3.80–5.10)
RDW: 16.6 % — ABNORMAL HIGH (ref 11.0–15.0)
WBC: 8.1 10*3/uL (ref 3.8–10.8)

## 2017-04-28 LAB — IRON AND TIBC
%SAT: 56 % — AB (ref 11–50)
Iron: 138 ug/dL (ref 40–190)
TIBC: 245 ug/dL — AB (ref 250–450)
UIBC: 107 ug/dL

## 2017-04-28 LAB — FERRITIN: Ferritin: 687 ng/mL — ABNORMAL HIGH (ref 10–232)

## 2017-05-02 ENCOUNTER — Encounter: Payer: Self-pay | Admitting: Gastroenterology

## 2017-05-02 ENCOUNTER — Ambulatory Visit (INDEPENDENT_AMBULATORY_CARE_PROVIDER_SITE_OTHER): Payer: BLUE CROSS/BLUE SHIELD | Admitting: Gastroenterology

## 2017-05-02 DIAGNOSIS — K279 Peptic ulcer, site unspecified, unspecified as acute or chronic, without hemorrhage or perforation: Secondary | ICD-10-CM | POA: Diagnosis not present

## 2017-05-02 MED ORDER — LIDOCAINE VISCOUS 2 % MT SOLN: 15.0000 mL | Freq: Four times a day (QID) | OROMUCOSAL | 1 refills | Status: DC | PRN

## 2017-05-02 NOTE — Progress Notes (Signed)
.       Referring Provider: Redmond School, MD Primary Care Physician:  Redmond School, MD Primary GI: Dr. Oneida Alar   Chief Complaint  Patient presents with  . Abdominal Pain    still burning  . Constipation    Linzess was helping until last week, no bm x 1 week    HPI:   Deanna Berry is a 46 y.o. female presenting today with a history of IDA, PUD, chronic constipation, last clonoscopy in 2016 normal with Dr. Britta Mccreedy, and recently inpatient at [facility] with worsening IDA. EGD performed by Dr. Alessandra Bevels on 03/24/17 with a large non-bleeding clean-based pyloric channel ulcer with narrowing of the pyloric channel, unable to advance the scope into the duodenum. . No evidence of active bleeding. Chronic gastritis.  Received 2 units PRBCs while inpatient May 2018. Recent Hgb 13.2. Weight down from 122 in April down to 107. Recently diagnosed with hypothyroidism after TSH was markedly elevated in our office during work-up for constipation.   Taking Protonix BID. Pain present constantly in upper abdomen. No nausea or vomiting. Doesn't eat as much. Weight loss noted. Doing well with Linzess 290 mcg. Had been going really well for awhile and then had a week long spell without it, now had another BM just this morning.   History of PUD with EGD in 2016 by Dr. Britta Mccreedy. She notes that she had recently been taking NSAIDs but STOPPED after this most recent admission.   Past Medical History:  Diagnosis Date  . Bipolar disorder ([place])   . Cleft palate   . Headache(784.0)   . PUD (peptic ulcer disease)   . Sinus congestion   . Substance abuse     Past Surgical History:  Procedure Laterality Date  . CESAREAN SECTION    . COLONOSCOPY  2016   Dr. Britta Mccreedy: normal   . ESOPHAGOGASTRODUODENOSCOPY  03/2015   Dr. Britta Mccreedy: esophagitis, 2 medium-sized ulcers in antrum and pylorus, large ulcer in duodenal bulb, hiatal hernia, no specimens collected.   . ESOPHAGOGASTRODUODENOSCOPY  06/2015   Dr. Britta Mccreedy: small  pyloric channel ulcer with mild pyloric stenosis, previous gastric and duodenal ulcers completely healed   . ESOPHAGOGASTRODUODENOSCOPY (EGD) WITH PROPOFOL N/A 03/24/2017   [facility]: Z-line regular, small hiatal hernia, non-bleeding large clean-based pyloric channel ulcer with narrowing of the pyloric channel, unable to advance scope into dudoenum, chronic gastritis  . HERNIA REPAIR      Current Outpatient Prescriptions  Medication Sig Dispense Refill  . carbamazepine (TEGRETOL XR) 200 MG 12 hr tablet Take 600 mg by mouth 2 (two) times daily.   0  . gabapentin (NEURONTIN) 800 MG tablet Take 800 mg by mouth 3 (three) times daily.  0  . linaclotide (LINZESS) 290 MCG CAPS capsule Take 1 capsule (290 mcg total) by mouth daily before breakfast. 90 capsule 3  . pantoprazole (PROTONIX) 40 MG tablet Take 1 tablet (40 mg total) by mouth 2 (two) times daily before a meal. 60 tablet 3  . polyethylene glycol (MIRALAX / GLYCOLAX) packet Take 17 g by mouth as needed.    Marland Kitchen QUEtiapine (SEROQUEL XR) 300 MG 24 hr tablet Take 300 mg by mouth at bedtime.   0  . TRINTELLIX 20 MG TABS Take 20 mg by mouth daily.  0  . levothyroxine (SYNTHROID, LEVOTHROID) 75 MCG tablet Take 75 mcg by mouth daily.  0  . lidocaine (XYLOCAINE) 2 % solution Use as directed 15 mLs in the mouth or throat every 6 (six) hours as needed  for mouth pain. 300 mL 1   No current facility-administered medications for this visit.     Allergies as of 05/02/2017 - Review Complete 05/02/2017  Allergen Reaction Noted  . Amoxil [amoxicillin] Hives 12/11/2012  . Adhesive [tape] Rash 03/17/2017  . Neosporin original [bacitracin-neomycin-polymyxin] Rash 03/17/2017    Family History  Problem Relation Age of Onset  . Colon cancer Mother 77    Social History   Social History  . Marital status: Single    Spouse name: N/A  . Number of children: N/A  . Years of education: N/A   Occupational History  . office manager    Social History Main Topics    . Smoking status: Current Every Day Smoker    Packs/day: 1.00    Types: Cigarettes  . Smokeless tobacco: Never Used  . Alcohol use No  . 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 Sexual activity: Not Asked   Other Topics Concern  . None   Social History Narrative  . None    Review of Systems: As mentioned in HPI   Physical Exam: BP 104/78   Pulse 93   Temp 98.5 F (36.9 C) (Oral)   Ht 5' (1.524 m)   Wt 107 lb (48.5 kg)   LMP 02/28/2017 (Approximate)   BMI 20.90 kg/m  General:   Alert and oriented. No distress noted. Pleasant and cooperative.  Head:  Normocephalic and atraumatic. Eyes:  Conjuctiva clear without scleral icterus. Mouth:  Oral mucosa pink and moist. Good dentition. No lesions. Heart:  S1, S2 present without murmurs, rubs, or gallops. Regular rate and rhythm. Abdomen:  +BS, soft, TTP epigastric and non-distended. No rebound or guarding. No HSM or masses noted. Msk:  Symmetrical without gross deformities. Normal posture. Extremities:  Without edema. Neurologic:  Alert and  oriented x4;  grossly normal neurologically. Psych:  Alert and cooperative. Normal mood and affect.  Lab Results  Component Value Date   WBC 8.1 04/27/2017   HGB 13.2 04/27/2017   HCT 41.4 04/27/2017   MCV 84.5 04/27/2017   PLT 354 04/27/2017

## 2017-05-02 NOTE — Patient Instructions (Signed)
Continue taking Protonix twice a day, 30 minutes before breakfast and dinner.   I have sent in a lidocaine solution to swallow every 6 hours as needed.   It is very important that you call if any worsening discomfort, inability to eat, nausea, vomiting.  We have scheduled you for a repeat upper endoscopy with dilation by Dr. Oneida Alar in the near future.

## 2017-05-04 ENCOUNTER — Telehealth: Payer: Self-pay

## 2017-05-04 ENCOUNTER — Other Ambulatory Visit: Payer: Self-pay

## 2017-05-04 DIAGNOSIS — K279 Peptic ulcer, site unspecified, unspecified as acute or chronic, without hemorrhage or perforation: Secondary | ICD-10-CM

## 2017-05-04 NOTE — Telephone Encounter (Signed)
Called pt. EGD/Dil with Propofol with [facility] scheduled for 06/06/17 at 7:30am. Instructions given on phone and mailed. Will notify pt of pre-op appt.

## 2017-05-04 NOTE — Telephone Encounter (Signed)
Called and informed pt of pre-op appt 06/01/17 at 12:45pm. Letter also mailed with instructions.

## 2017-05-05 ENCOUNTER — Encounter: Payer: Self-pay | Admitting: Gastroenterology

## 2017-05-05 NOTE — Assessment & Plan Note (Signed)
46 year old female with history of PUD in 2016, recently found to have IDA and admitted to [facility] during process of our evaluation due to acute on chronic anemia. She was found to have a large non-bleeding clean-based pyloric channel ulcer with narrowing of the pyloric channel, unable to advance the scope into the duodenum. No evidence of active bleeding. Chronic gastritis.  Received 2 units PRBCs while inpatient May 2018. Recent Hgb 13.2. She reports NSAIDs historically but has stopped this. Remains on Protonix BID and still notes pain that is constant. No N/V. No signs of obstruction. Needs surveillance EGD. Concern raised for possibility of surgical evaluation in future depending on findings.   Proceed with upper endoscopy +/- dilation if indicated in the near future with Dr. Oneida Alar. The risks, benefits, and alternatives have been discussed in detail with patient. They have stated understanding and desire to proceed.  PROPOFOL due to polypharmacy Continue Protonix BID Continue avoidance of NSAIDs Viscous lidocaine prn Call if worsening pain Weight loss multifactorial in setting of thyroid abnormalities. Recommend close follow-up with Dr. Gerarda Fraction for management

## 2017-05-07 NOTE — Progress Notes (Signed)
REVIEWED. PLEASE CALL PT.SHE NEEDS A GI TO EVALUATE FOR GASTRIC OUTLET OBSTRUCTION NEXT WEEK, Dx; WEIGHT LOSS, PYLORIC CHANNEL ULCER MAY 2018.

## 2017-05-08 ENCOUNTER — Other Ambulatory Visit: Payer: Self-pay

## 2017-05-08 DIAGNOSIS — K259 Gastric ulcer, unspecified as acute or chronic, without hemorrhage or perforation: Secondary | ICD-10-CM

## 2017-05-08 DIAGNOSIS — R634 Abnormal weight loss: Secondary | ICD-10-CM

## 2017-05-08 NOTE — Progress Notes (Signed)
CC'D TO PCP °

## 2017-05-08 NOTE — Progress Notes (Signed)
Called and informed pt. She is ok with having Upper GI Series. She would like to have it done the end of next week. Upper GI series scheduled for 05/18/17 at 8:30am, pt to arrive at 8:15am. NPO after midnight. Called pt back and informed her of appt. Letter also mailed.

## 2017-05-11 NOTE — Addendum Note (Signed)
Addendum  created 05/11/17 1446 by [name], MD   Sign clinical note

## 2017-05-11 NOTE — Anesthesia Postprocedure Evaluation (Signed)
Anesthesia Post Note  Patient: Deanna Berry  Procedure(s) Performed: Procedure(s) (LRB): ESOPHAGOGASTRODUODENOSCOPY (EGD) WITH PROPOFOL (N/A)     Anesthesia Post Evaluation  Last Vitals:  Vitals:   03/24/17 2200 03/25/17 0604  BP: (!) 141/88 123/70  Pulse: 72 76  Resp:  20  Temp:  36.9 C    Last Pain:  Vitals:   03/25/17 0830  TempSrc:   PainSc: 0-No pain                 [name]

## 2017-05-18 ENCOUNTER — Ambulatory Visit (HOSPITAL_COMMUNITY)
Admission: RE | Admit: 2017-05-18 | Discharge: 2017-05-18 | Disposition: A | Discharge status: Home / Self Care | Payer: BLUE CROSS/BLUE SHIELD | Source: Ambulatory Visit | Attending: Gastroenterology | Admitting: Gastroenterology

## 2017-05-18 DIAGNOSIS — R634 Abnormal weight loss: Secondary | ICD-10-CM | POA: Diagnosis not present

## 2017-05-18 DIAGNOSIS — R635 Abnormal weight gain: Secondary | ICD-10-CM | POA: Diagnosis not present

## 2017-05-18 DIAGNOSIS — K259 Gastric ulcer, unspecified as acute or chronic, without hemorrhage or perforation: Secondary | ICD-10-CM | POA: Diagnosis not present

## 2017-05-22 ENCOUNTER — Telehealth: Payer: Self-pay | Admitting: Gastroenterology

## 2017-05-22 DIAGNOSIS — R809 Proteinuria, unspecified: Secondary | ICD-10-CM | POA: Insufficient documentation

## 2017-05-22 NOTE — Telephone Encounter (Signed)
PLEASE CALL PT. HER UGI SHOWS CHANGES CONSISTENT WITH PRIOR PUD. SHE DOES NOT HAVE EVIDENCE FOR GASTRIC OUTLET OBSTRICTION. SHE SHOULD COMPETE THE EGD AND IF NEEDED WE CAN STRETCH HER PYLORUS. SHE ALSO NEEDS SERUM AND URINE ELECTROPHORESIS TO COMPLETE THE EVALUATION OF HER ANEMIA/WEIGHT LOSS.  NEXT OPV IN NOV 2018 E30 ANEMIA/LOW ALBUMIN/PUD.

## 2017-05-22 NOTE — Assessment & Plan Note (Signed)
SPEP/UPEP NEEDED.

## 2017-05-23 ENCOUNTER — Encounter: Payer: Self-pay | Admitting: Gastroenterology

## 2017-05-23 NOTE — Telephone Encounter (Signed)
Pt is aware of the results and recommendations. She will go to the lab to do blood work.  She said she has not received instructions for her EGD.  Forwarding to [facility] .

## 2017-05-23 NOTE — Telephone Encounter (Signed)
I have mailed her another copy of the EGD instructions

## 2017-05-23 NOTE — Telephone Encounter (Signed)
APPT MADE AND LETTER SENT TO PATIENT  °

## 2017-05-29 NOTE — Patient Instructions (Signed)
Deanna Berry  05/29/2017     @PREFPERIOPPHARMACY @   Your procedure is scheduled on  06/06/2017   Report to [facility] at  48  A.M.  Call this number if you have problems the morning of surgery:  [phone]   Remember:  Do not eat food or drink liquids after midnight.  Take these medicines the morning of surgery with A SIP OF WATER  Tegretol, neurontin, levothyroxine, protonix, trintellix.   Do not wear jewelry, make-up or nail polish.  Do not wear lotions, powders, or perfumes, or deoderant.  Do not shave 48 hours prior to surgery.  Men may shave face and neck.  Do not bring valuables to the hospital.  [facility] is not responsible for any belongings or valuables.  Contacts, dentures or bridgework may not be worn into surgery.  Leave your suitcase in the car.  After surgery it may be brought to your room.  For patients admitted to the hospital, discharge time will be determined by your treatment team.  Patients discharged the day of surgery will not be allowed to drive home.   Name and phone number of your driver:   family Special instructions:  Follow the diet instructions given to you by Dr Nona Dell office.  Please read over the following fact sheets that you were given. Anesthesia Post-op Instructions and Care and Recovery After Surgery       Esophagogastroduodenoscopy Esophagogastroduodenoscopy (EGD) is a procedure to examine the lining of the esophagus, stomach, and first part of the small intestine (duodenum). This procedure is done to check for problems such as inflammation, bleeding, ulcers, or growths. During this procedure, a long, flexible, lighted tube with a camera attached (endoscope) is inserted down the throat. Tell a health care provider about:  Any allergies you have.  All medicines you are taking, including vitamins, herbs, eye drops, creams, and over-the-counter medicines.  Any problems you or family members have had with anesthetic  medicines.  Any blood disorders you have.  Any surgeries you have had.  Any medical conditions you have.  Whether you are pregnant or may be pregnant. What are the risks? Generally, this is a safe procedure. However, problems may occur, including:  Infection.  Bleeding.  A tear (perforation) in the esophagus, stomach, or duodenum.  Trouble breathing.  Excessive sweating.  Spasms of the larynx.  A slowed heartbeat.  Low blood pressure.  What happens before the procedure?  Follow instructions from your health care provider about eating or drinking restrictions.  Ask your health care provider about: ? Changing or stopping your regular medicines. This is especially important if you are taking diabetes medicines or blood thinners. ? Taking medicines such as aspirin and ibuprofen. These medicines can thin your blood. Do not take these medicines before your procedure if your health care provider instructs you not to.  Plan to have someone take you home after the procedure.  If you wear dentures, be ready to remove them before the procedure. What happens during the procedure?  To reduce your risk of infection, your health care team will wash or sanitize their hands.  An IV tube will be put in a vein in your hand or arm. You will get medicines and fluids through this tube.  You will be given one or more of the following: ? A medicine to help you relax (sedative). ? A medicine to numb the area (local anesthetic). This medicine may be sprayed  into your throat. It will make you feel more comfortable and keep you from gagging or coughing during the procedure. ? A medicine for pain.  A mouth guard may be placed in your mouth to protect your teeth and to keep you from biting on the endoscope.  You will be asked to lie on your left side.  The endoscope will be lowered down your throat into your esophagus, stomach, and duodenum.  Air will be put into the endoscope. This will  help your health care provider see better.  The lining of your esophagus, stomach, and duodenum will be examined.  Your health care provider may: ? Take a tissue sample so it can be looked at in a lab (biopsy). ? Remove growths. ? Remove objects (foreign bodies) that are stuck. ? Treat any bleeding with medicines or other devices that stop tissue from bleeding. ? Widen (dilate) or stretch narrowed areas of your esophagus and stomach.  The endoscope will be taken out. The procedure may vary among health care providers and hospitals. What happens after the procedure?  Your blood pressure, heart rate, breathing rate, and blood oxygen level will be monitored often until the medicines you were given have worn off.  Do not eat or drink anything until the numbing medicine has worn off and your gag reflex has returned. This information is not intended to replace advice given to you by your health care provider. Make sure you discuss any questions you have with your health care provider. Document Released: 02/17/2005 Document Revised: 03/24/2016 Document Reviewed: 09/10/2015 Elsevier Interactive Patient Education  2018 [employer]. Esophagogastroduodenoscopy, Care After Refer to this sheet in the next few weeks. These instructions provide you with information about caring for yourself after your procedure. Your health care provider may also give you more specific instructions. Your treatment has been planned according to current medical practices, but problems sometimes occur. Call your health care provider if you have any problems or questions after your procedure. What can I expect after the procedure? After the procedure, it is common to have:  A sore throat.  Nausea.  Bloating.  Dizziness.  Fatigue.  Follow these instructions at home:  Do not eat or drink anything until the numbing medicine (local anesthetic) has worn off and your gag reflex has returned. You will know that the  local anesthetic has worn off when you can swallow comfortably.  Do not drive for 24 hours if you received a medicine to help you relax (sedative).  If your health care provider took a tissue sample for testing during the procedure, make sure to get your test results. This is your responsibility. Ask your health care provider or the department performing the test when your results will be ready.  Keep all follow-up visits as told by your health care provider. This is important. Contact a health care provider if:  You cannot stop coughing.  You are not urinating.  You are urinating less than usual. Get help right away if:  You have trouble swallowing.  You cannot eat or drink.  You have throat or chest pain that gets worse.  You are dizzy or light-headed.  You faint.  You have nausea or vomiting.  You have chills.  You have a fever.  You have severe abdominal pain.  You have black, tarry, or bloody stools. This information is not intended to replace advice given to you by your health care provider. Make sure you discuss any questions you have with your health  care provider. Document Released: 10/03/2012 Document Revised: 03/24/2016 Document Reviewed: 09/10/2015 Elsevier Interactive Patient Education  2018 [place] Anesthesia is a term that refers to techniques, procedures, and medicines that help a person stay safe and comfortable during a medical procedure. Monitored anesthesia care, or sedation, is one type of anesthesia. Your anesthesia specialist may recommend sedation if you will be having a procedure that does not require you to be unconscious, such as:  Cataract surgery.  A dental procedure.  A biopsy.  A colonoscopy.  During the procedure, you may receive a medicine to help you relax (sedative). There are three levels of sedation:  Mild sedation. At this level, you may feel awake and relaxed. You will be able to follow  directions.  Moderate sedation. At this level, you will be sleepy. You may not remember the procedure.  Deep sedation. At this level, you will be asleep. You will not remember the procedure.  The more medicine you are given, the deeper your level of sedation will be. Depending on how you respond to the procedure, the anesthesia specialist may change your level of sedation or the type of anesthesia to fit your needs. An anesthesia specialist will monitor you closely during the procedure. Let your health care provider know about:  Any allergies you have.  All medicines you are taking, including vitamins, herbs, eye drops, creams, and over-the-counter medicines.  Any use of steroids (by mouth or as a cream).  Any problems you or family members have had with sedatives and anesthetic medicines.  Any blood disorders you have.  Any surgeries you have had.  Any medical conditions you have, such as sleep apnea.  Whether you are pregnant or may be pregnant.  Any use of cigarettes, alcohol, or street drugs. What are the risks? Generally, this is a safe procedure. However, problems may occur, including:  Getting too much medicine (oversedation).  Nausea.  Allergic reaction to medicines.  Trouble breathing. If this happens, a breathing tube may be used to help with breathing. It will be removed when you are awake and breathing on your own.  Heart trouble.  Lung trouble.  Before the procedure Staying hydrated Follow instructions from your health care provider about hydration, which may include:  Up to 2 hours before the procedure - you may continue to drink clear liquids, such as water, clear fruit juice, black coffee, and plain tea.  Eating and drinking restrictions Follow instructions from your health care provider about eating and drinking, which may include:  8 hours before the procedure - stop eating heavy meals or foods such as meat, fried foods, or fatty foods.  6 hours  before the procedure - stop eating light meals or foods, such as toast or cereal.  6 hours before the procedure - stop drinking milk or drinks that contain milk.  2 hours before the procedure - stop drinking clear liquids.  Medicines Ask your health care provider about:  Changing or stopping your regular medicines. This is especially important if you are taking diabetes medicines or blood thinners.  Taking medicines such as aspirin and ibuprofen. These medicines can thin your blood. Do not take these medicines before your procedure if your health care provider instructs you not to.  Tests and exams  You will have a physical exam.  You may have blood tests done to show: ? How well your kidneys and liver are working. ? How well your blood can clot.  General instructions  Plan  to have someone take you home from the hospital or clinic.  If you will be going home right after the procedure, plan to have someone with you for 24 hours.  What happens during the procedure?  Your blood pressure, heart rate, breathing, level of pain and overall condition will be monitored.  An IV tube will be inserted into one of your veins.  Your anesthesia specialist will give you medicines as needed to keep you comfortable during the procedure. This may mean changing the level of sedation.  The procedure will be performed. After the procedure  Your blood pressure, heart rate, breathing rate, and blood oxygen level will be monitored until the medicines you were given have worn off.  Do not drive for 24 hours if you received a sedative.  You may: ? Feel sleepy, clumsy, or nauseous. ? Feel forgetful about what happened after the procedure. ? Have a sore throat if you had a breathing tube during the procedure. ? Vomit. This information is not intended to replace advice given to you by your health care provider. Make sure you discuss any questions you have with your health care provider. Document  Released: 07/13/2005 Document Revised: 03/25/2016 Document Reviewed: 02/07/2016 Elsevier Interactive Patient Education  2018 [place], Care After These instructions provide you with information about caring for yourself after your procedure. Your health care provider may also give you more specific instructions. Your treatment has been planned according to current medical practices, but problems sometimes occur. Call your health care provider if you have any problems or questions after your procedure. What can I expect after the procedure? After your procedure, it is common to:  Feel sleepy for several hours.  Feel clumsy and have poor balance for several hours.  Feel forgetful about what happened after the procedure.  Have poor judgment for several hours.  Feel nauseous or vomit.  Have a sore throat if you had a breathing tube during the procedure.  Follow these instructions at home: For at least 24 hours after the procedure:   Do not: ? Participate in activities in which you could fall or become injured. ? Drive. ? Use heavy machinery. ? Drink alcohol. ? Take sleeping pills or medicines that cause drowsiness. ? Make important decisions or sign legal documents. ? Take care of children on your own.  Rest. Eating and drinking  Follow the diet that is recommended by your health care provider.  If you vomit, drink water, juice, or soup when you can drink without vomiting.  Make sure you have little or no nausea before eating solid foods. General instructions  Have a responsible adult stay with you until you are awake and alert.  Take over-the-counter and prescription medicines only as told by your health care provider.  If you smoke, do not smoke without supervision.  Keep all follow-up visits as told by your health care provider. This is important. Contact a health care provider if:  You keep feeling nauseous or you keep  vomiting.  You feel light-headed.  You develop a rash.  You have a fever. Get help right away if:  You have trouble breathing. This information is not intended to replace advice given to you by your health care provider. Make sure you discuss any questions you have with your health care provider. Document Released: 02/07/2016 Document Revised: 06/08/2016 Document Reviewed: 02/07/2016 Elsevier Interactive Patient Education  [employer].

## 2017-06-01 ENCOUNTER — Encounter (HOSPITAL_COMMUNITY): Payer: Self-pay

## 2017-06-01 ENCOUNTER — Encounter (HOSPITAL_COMMUNITY)
Admission: RE | Admit: 2017-06-01 | Discharge: 2017-06-01 | Disposition: A | Discharge status: Home / Self Care | Payer: BLUE CROSS/BLUE SHIELD | Source: Ambulatory Visit | Attending: Gastroenterology | Admitting: Gastroenterology

## 2017-06-01 DIAGNOSIS — Z01818 Encounter for other preprocedural examination: Secondary | ICD-10-CM | POA: Diagnosis not present

## 2017-06-01 HISTORY — DX: Major depressive disorder, single episode, unspecified: F32.9

## 2017-06-01 HISTORY — DX: Depression, unspecified: F32.A

## 2017-06-01 HISTORY — DX: Hypothyroidism, unspecified: E03.9

## 2017-06-01 HISTORY — DX: Anemia, unspecified: D64.9

## 2017-06-01 HISTORY — DX: Anxiety disorder, unspecified: F41.9

## 2017-06-01 LAB — CBC WITH DIFFERENTIAL/PLATELET
Basophils Absolute: 0.1 10*3/uL (ref 0.0–0.1)
Basophils Relative: 1 %
EOS ABS: 0.3 10*3/uL (ref 0.0–0.7)
EOS PCT: 4 %
HCT: 41.5 % (ref 36.0–46.0)
Hemoglobin: 13.5 g/dL (ref 12.0–15.0)
LYMPHS ABS: 2.5 10*3/uL (ref 0.7–4.0)
Lymphocytes Relative: 32 %
MCH: 27.2 pg (ref 26.0–34.0)
MCHC: 32.5 g/dL (ref 30.0–36.0)
MCV: 83.7 fL (ref 78.0–100.0)
Monocytes Absolute: 0.7 10*3/uL (ref 0.1–1.0)
Monocytes Relative: 9 %
Neutro Abs: 4.3 10*3/uL (ref 1.7–7.7)
Neutrophils Relative %: 54 %
PLATELETS: 349 10*3/uL (ref 150–400)
RBC: 4.96 MIL/uL (ref 3.87–5.11)
RDW: 18 % — ABNORMAL HIGH (ref 11.5–15.5)
WBC: 7.9 10*3/uL (ref 4.0–10.5)

## 2017-06-01 LAB — BASIC METABOLIC PANEL
Anion gap: 12 (ref 5–15)
BUN: 18 mg/dL (ref 6–20)
CO2: 20 mmol/L — ABNORMAL LOW (ref 22–32)
Calcium: 9.4 mg/dL (ref 8.9–10.3)
Chloride: 104 mmol/L (ref 101–111)
Creatinine, Ser: 1.32 mg/dL — ABNORMAL HIGH (ref 0.44–1.00)
GFR calc Af Amer: 55 mL/min — ABNORMAL LOW (ref >60)
GFR, EST NON AFRICAN AMERICAN: 48 mL/min — AB (ref >60)
GLUCOSE: 85 mg/dL (ref 65–99)
Potassium: 4.8 mmol/L (ref 3.5–5.1)
SODIUM: 136 mmol/L (ref 135–145)

## 2017-06-06 ENCOUNTER — Ambulatory Visit (HOSPITAL_COMMUNITY): Payer: BLUE CROSS/BLUE SHIELD | Admitting: Anesthesiology

## 2017-06-06 ENCOUNTER — Encounter (HOSPITAL_COMMUNITY)
Admission: RE | Disposition: A | Discharge status: Home / Self Care | Payer: Self-pay | Source: Ambulatory Visit | Attending: Gastroenterology

## 2017-06-06 ENCOUNTER — Telehealth: Payer: Self-pay

## 2017-06-06 ENCOUNTER — Ambulatory Visit (HOSPITAL_COMMUNITY)
Admission: RE | Admit: 2017-06-06 | Discharge: 2017-06-06 | Disposition: A | Discharge status: Home / Self Care | Payer: BLUE CROSS/BLUE SHIELD | Source: Ambulatory Visit | Attending: Gastroenterology | Admitting: Gastroenterology

## 2017-06-06 ENCOUNTER — Encounter (HOSPITAL_COMMUNITY): Payer: Self-pay | Admitting: *Deleted

## 2017-06-06 DIAGNOSIS — Z79899 Other long term (current) drug therapy: Secondary | ICD-10-CM | POA: Diagnosis not present

## 2017-06-06 DIAGNOSIS — K259 Gastric ulcer, unspecified as acute or chronic, without hemorrhage or perforation: Secondary | ICD-10-CM | POA: Insufficient documentation

## 2017-06-06 DIAGNOSIS — K3189 Other diseases of stomach and duodenum: Secondary | ICD-10-CM | POA: Insufficient documentation

## 2017-06-06 DIAGNOSIS — K279 Peptic ulcer, site unspecified, unspecified as acute or chronic, without hemorrhage or perforation: Secondary | ICD-10-CM

## 2017-06-06 DIAGNOSIS — K449 Diaphragmatic hernia without obstruction or gangrene: Secondary | ICD-10-CM | POA: Insufficient documentation

## 2017-06-06 DIAGNOSIS — F319 Bipolar disorder, unspecified: Secondary | ICD-10-CM | POA: Insufficient documentation

## 2017-06-06 DIAGNOSIS — R1013 Epigastric pain: Secondary | ICD-10-CM | POA: Insufficient documentation

## 2017-06-06 DIAGNOSIS — F17210 Nicotine dependence, cigarettes, uncomplicated: Secondary | ICD-10-CM | POA: Diagnosis not present

## 2017-06-06 DIAGNOSIS — R1310 Dysphagia, unspecified: Secondary | ICD-10-CM | POA: Diagnosis not present

## 2017-06-06 DIAGNOSIS — E039 Hypothyroidism, unspecified: Secondary | ICD-10-CM | POA: Insufficient documentation

## 2017-06-06 DIAGNOSIS — K311 Adult hypertrophic pyloric stenosis: Secondary | ICD-10-CM | POA: Diagnosis not present

## 2017-06-06 DIAGNOSIS — K2950 Unspecified chronic gastritis without bleeding: Secondary | ICD-10-CM | POA: Insufficient documentation

## 2017-06-06 HISTORY — PX: SAVORY DILATION: SHX5439

## 2017-06-06 HISTORY — PX: BIOPSY: SHX5522

## 2017-06-06 HISTORY — PX: ESOPHAGOGASTRODUODENOSCOPY (EGD) WITH PROPOFOL: SHX5813

## 2017-06-06 LAB — RAPID URINE DRUG SCREEN, HOSP PERFORMED
AMPHETAMINES: NOT DETECTED
BENZODIAZEPINES: NOT DETECTED
Barbiturates: NOT DETECTED
COCAINE: NOT DETECTED
OPIATES: NOT DETECTED
TETRAHYDROCANNABINOL: NOT DETECTED

## 2017-06-06 LAB — PREGNANCY, URINE: Preg Test, Ur: NEGATIVE

## 2017-06-06 SURGERY — ESOPHAGOGASTRODUODENOSCOPY (EGD) WITH PROPOFOL
Anesthesia: Monitor Anesthesia Care

## 2017-06-06 MED ORDER — MIDAZOLAM HCL 2 MG/2ML IJ SOLN
INTRAMUSCULAR | Status: AC
  Filled 2017-06-06: qty 2

## 2017-06-06 MED ORDER — CHLORHEXIDINE GLUCONATE CLOTH 2 % EX PADS: 6.0000 | MEDICATED_PAD | Freq: Once | CUTANEOUS | Status: DC

## 2017-06-06 MED ORDER — SODIUM CHLORIDE 0.9 % IJ SOLN
INTRAMUSCULAR | Status: AC
  Filled 2017-06-06: qty 10

## 2017-06-06 MED ORDER — FENTANYL CITRATE (PF) 100 MCG/2ML IJ SOLN
INTRAMUSCULAR | Status: AC
  Filled 2017-06-06: qty 2

## 2017-06-06 MED ORDER — PROPOFOL 500 MG/50ML IV EMUL
INTRAVENOUS | Status: DC | PRN
  Administered 2017-06-06: 150 ug/kg/min via INTRAVENOUS
  Administered 2017-06-06: 09:00:00 via INTRAVENOUS

## 2017-06-06 MED ORDER — FENTANYL CITRATE (PF) 100 MCG/2ML IJ SOLN
25.0000 ug | Freq: Once | INTRAMUSCULAR | Status: AC
  Administered 2017-06-06: 25 ug via INTRAVENOUS

## 2017-06-06 MED ORDER — LACTATED RINGERS IV SOLN
INTRAVENOUS | Status: DC
  Administered 2017-06-06: 1000 mL via INTRAVENOUS

## 2017-06-06 MED ORDER — LIDOCAINE HCL (CARDIAC) 10 MG/ML IV SOLN
INTRAVENOUS | Status: DC | PRN
  Administered 2017-06-06: 40 mg via INTRAVENOUS

## 2017-06-06 MED ORDER — EPHEDRINE SULFATE 50 MG/ML IJ SOLN
INTRAMUSCULAR | Status: AC
  Filled 2017-06-06: qty 1

## 2017-06-06 MED ORDER — LIDOCAINE VISCOUS 2 % MT SOLN
3.0000 mL | OROMUCOSAL | Status: DC | PRN
  Administered 2017-06-06: 3 mL via OROMUCOSAL

## 2017-06-06 MED ORDER — LIDOCAINE VISCOUS 2 % MT SOLN
OROMUCOSAL | Status: AC
  Filled 2017-06-06: qty 15

## 2017-06-06 MED ORDER — PROPOFOL 10 MG/ML IV BOLUS
INTRAVENOUS | Status: AC
  Filled 2017-06-06: qty 40

## 2017-06-06 MED ORDER — MIDAZOLAM HCL 2 MG/2ML IJ SOLN
1.0000 mg | INTRAMUSCULAR | Status: AC
  Administered 2017-06-06: 2 mg via INTRAVENOUS

## 2017-06-06 MED ORDER — PROPOFOL 10 MG/ML IV BOLUS
INTRAVENOUS | Status: AC
  Filled 2017-06-06: qty 60

## 2017-06-06 NOTE — Telephone Encounter (Signed)
Pt called and said she had her EGD this Am. She said when they checked her urine it was dark brown and she mentioned it to the nurse and they just said it was a little dark.   She also is complaining with back pain, but said that was hurting Before the procedure today, that she has been putting up with that for several weeks. But said it is getting almost unbearable. It hurts from her mid back to down lower back.  Please advise!

## 2017-06-06 NOTE — Telephone Encounter (Signed)
I spoke to Roseanne Kaufman, NP in Dr. Oneida Alar absence. I informed the pt via VM per Vicente Males, that since she has had this back pain for weeks, she should follow up with PCP, and if it gets severe she should go to ED.

## 2017-06-06 NOTE — H&P (Addendum)
Primary Care Physician:  Redmond School, MD Primary Gastroenterologist:  Dr. Oneida Alar  Pre-Procedure History & Physical: HPI:  Deanna Berry is a 46 y.o. female here for DYSPEPSIA.   Past Medical History:  Diagnosis Date  . Anemia   . Anxiety   . Bipolar disorder ([place])   . Cleft palate   . Depression   . Headache(784.0)   . Hypothyroidism   . PUD (peptic ulcer disease)   . Sinus congestion   . Substance abuse     Past Surgical History:  Procedure Laterality Date  . CESAREAN SECTION    . CLEFT PALATE REPAIR  2013  . COLONOSCOPY  2016   Dr. Britta Mccreedy: normal   . ESOPHAGOGASTRODUODENOSCOPY  03/2015   Dr. Britta Mccreedy: esophagitis, 2 medium-sized ulcers in antrum and pylorus, large ulcer in duodenal bulb, hiatal hernia, no specimens collected.   . ESOPHAGOGASTRODUODENOSCOPY  06/2015   Dr. Britta Mccreedy: small pyloric channel ulcer with mild pyloric stenosis, previous gastric and duodenal ulcers completely healed   . ESOPHAGOGASTRODUODENOSCOPY (EGD) WITH PROPOFOL N/A 03/24/2017   [facility]: Z-line regular, small hiatal hernia, non-bleeding large clean-based pyloric channel ulcer with narrowing of the pyloric channel, unable to advance scope into dudoenum, chronic gastritis  . HERNIA REPAIR    . MULTIPLE TOOTH EXTRACTIONS      Prior to Admission medications   Medication Sig Start Date End Date Taking? Authorizing Provider  carbamazepine (TEGRETOL XR) 200 MG 12 hr tablet Take 200 mg by mouth 2 (two) times daily.  02/10/17  Yes [provider]  carbamazepine (TEGRETOL XR) 400 MG 12 hr tablet Take 400 mg by mouth 2 (two) times daily. 05/19/17  Yes [provider]  gabapentin (NEURONTIN) 800 MG tablet Take 800 mg by mouth 3 (three) times daily. 01/30/17  Yes [provider]  levothyroxine (SYNTHROID, LEVOTHROID) 75 MCG tablet Take 75 mcg by mouth daily before breakfast.  03/21/17  Yes [provider]  lidocaine (XYLOCAINE) 2 % solution Use as directed 15 mLs in the mouth or  throat every 6 (six) hours as needed for mouth pain. 05/02/17  Yes Annitta Needs, NP  linaclotide [facility]) 290 MCG CAPS capsule Take 1 capsule (290 mcg total) by mouth daily before breakfast. 03/28/17  Yes Annitta Needs, NP  pantoprazole (PROTONIX) 40 MG tablet Take 1 tablet (40 mg total) by mouth 2 (two) times daily before a meal. 03/30/17  Yes Carlis Stable, NP  polyethylene glycol (MIRALAX / GLYCOLAX) packet Take 17 g by mouth daily as needed (for constipation.).    Yes [provider]  QUEtiapine (SEROQUEL XR) 300 MG 24 hr tablet Take 300 mg by mouth at bedtime.  01/25/17  Yes [provider]  TRINTELLIX 20 MG TABS Take 20 mg by mouth daily. 02/10/17  Yes [provider]    Allergies as of 05/04/2017 - Review Complete 05/02/2017  Allergen Reaction Noted  . Amoxil [amoxicillin] Hives 12/11/2012  . Adhesive [tape] Rash 03/17/2017  . Neosporin original [bacitracin-neomycin-polymyxin] Rash 03/17/2017    Family History  Problem Relation Age of Onset  . Colon cancer Mother 8    Social History   Social History  . Marital status: Single    Spouse name: N/A  . Number of children: N/A  . Years of education: N/A   Occupational History  . office manager    Social History Main Topics  . Smoking status: Current Every Day Smoker    Packs/day: 1.00    Types: Cigarettes  . Smokeless  tobacco: Never Used  . Alcohol use No  . 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 Sexual activity: Not on file   Other Topics Concern  . Not on file   Social History Narrative  . No narrative on file    Review of Systems: See HPI, otherwise negative ROS   Physical Exam: BP 112/79   Temp 97.6 F (36.4 C) (Oral)   Resp (!) 28   SpO2 96%  General:   Alert,  pleasant and cooperative in NAD Head:  Normocephalic and atraumatic. Neck:  Supple; Lungs:  Clear throughout to auscultation.    Heart:  Regular rate and rhythm. Abdomen:  Soft,  nontender and nondistended. Normal bowel sounds, without guarding, and without rebound.   Neurologic:  Alert and  oriented x4;  grossly normal neurologically.  Impression/Plan:    DYSPEPSIA  PLAN:  EGD TODAY.DISCUSSED PROCEDURE, BENEFITS, & RISKS: < 1% chance of medication reaction, bleeding, or perforation.

## 2017-06-06 NOTE — Discharge Instructions (Signed)
I STRETCHED your PYLORUS BECAUSE YOU HAVE PYLORIC STENOSIS DUE TO YOUR ULCER, WHICH HAS NOT HEALED. You have gastritis. I biopsied your stomach.   DRINK WATER TO KEEP YOUR URINE LIGHT YELLOW.  FOLLOW A SOFT MECHANICAL DIET.  MEATS SHOULD BE CHOPPED OR GROUND ONLY. DO NOT EAT CHUNKS OF ANYTHING. SEE INFO BELOW.  YOUR BIOPSY RESULTS WILL BE AVAILABLE IN MY CHART AFTER      AND MY OFFICE WILL CONTACT YOU IN 10-14 DAYS WITH YOUR RESULTS.   REPEAT EGD FOR PYLORIC DILATION IN 2 MOS.  FOLLOW UP APPT IN 6 MONTHS.    UPPER ENDOSCOPY AFTER CARE Read the instructions outlined below and refer to this sheet in the next week. These discharge instructions provide you with general information on caring for yourself after you leave the hospital. While your treatment has been planned according to the most current medical practices available, unavoidable complications occasionally occur. If you have any problems or questions after discharge, call DR. Hilary Milks, [phone].  ACTIVITY  You may resume your regular activity, but move at a slower pace for the next 24 hours.   Take frequent rest periods for the next 24 hours.   Walking will help get rid of the air and reduce the bloated feeling in your belly (abdomen).   No driving for 24 hours (because of the medicine (anesthesia) used during the test).   You may shower.   Do not sign any important legal documents or operate any machinery for 24 hours (because of the anesthesia used during the test).    NUTRITION  Drink plenty of fluids.   You may resume your normal diet as instructed by your doctor.   Begin with a light meal and progress to your normal diet. Heavy or fried foods are harder to digest and may make you feel sick to your stomach (nauseated).   Avoid alcoholic beverages for 24 hours or as instructed.    MEDICATIONS  You may resume your normal medications.   WHAT YOU CAN EXPECT TODAY  Some feelings of bloating in the abdomen.     Passage of more gas than usual.    IF YOU HAD A BIOPSY TAKEN DURING THE UPPER ENDOSCOPY:  Eat a soft diet IF YOU HAVE NAUSEA, BLOATING, ABDOMINAL PAIN, OR VOMITING.    FINDING OUT THE RESULTS OF YOUR TEST Not all test results are available during your visit. DR. Oneida Alar WILL CALL YOU WITHIN 14 DAYS OF YOUR PROCEDUE WITH YOUR RESULTS. Do not assume everything is normal if you have not heard from DR. Dariel Pellecchia IN 14 DAYS, CALL HER OFFICE AT [phone].  SEEK IMMEDIATE MEDICAL ATTENTION AND CALL THE OFFICE: [phone] IF:  You have more than a spotting of blood in your stool.   Your belly is swollen (abdominal distention).   You are nauseated or vomiting.   You have a temperature over 101F.   You have abdominal pain or discomfort that is severe or gets worse throughout the day.   Pyloric Stenosis   Pyloric stenosis means that the opening (pylorus) out of the stomach is narrowed (stenosis). The narrowing is caused by an increase in the muscles of the pylorus. This increase in muscle results in a narrowing. This blocks food from passing out of the stomach into the small bowel.    Gastritis  Gastritis is an inflammation (the body's way of reacting to injury and/or infection) of the stomach. It is often caused by bacterial (germ) infections. It can also be caused BY  ASPIRIN, BC/GOODY POWDER'S, (IBUPROFEN) MOTRIN, OR ALEVE (NAPROXEN), chemicals (including alcohol), SPICY FOODS, and medications. This illness may be associated with generalized malaise (feeling tired, not well), UPPER ABDOMINAL STOMACH cramps, and fever. One common bacterial cause of gastritis is an organism known as H. Pylori. This can be treated with antibiotics.    Ulcer Disease (Peptic Ulcer, Gastric Ulcer, Duodenal Ulcer) You have an ulcer. This may be in your stomach (gastric ulcer) or in the first part of your small bowel, the duodenum (duodenal ulcer). An ulcer is a break in the lining of the stomach or duodenum.  The ulcer causes erosion into the deeper tissue.  CAUSES The stomach has a lining to protect itself from the acid that digests food. The lining can be damaged in two main ways:  The Helico Pylori bacteria (H. Pyolori) can infect the lining of the stomach and cause ulcers.   Nonsteroidal, anti-inflammatory medications (NSAIDS) can cause gastric ulcerations.   Smoking tobacco can increase the acid in the stomach. This can lead to ulcers, and will impair healing of ulcers.   Other factors, such as alcohol use and stress may contribute to ulcer formation. Rarely, a tumor or cancer can cause an ulcer.   SYMPTOMS The problems (symptoms) of ulcer disease are usually a burning or gnawing of the mid-upper belly (abdomen). This is often worse on an empty stomach and may get better with food. This may be associated with feeling sick to your stomach (nausea), bloating, and vomiting. If the ulcer results in bleeding, it can cause:  Black, tarry stools.   Vomiting of bright red blood.   Vomiting coffee-ground-looking materials.   SEVERE ANEMIA   HOME CARE INSTRUCTIONS Continue regular work and usual activities unless advised otherwise by your caregiver.  Avoid tobacco, alcohol, and caffeine. Tobacco use will decrease and slow the rates of healing.   Avoid foods that seem to aggravate or cause discomfort.   There are many over-the-counter products available to control stomach acid and other symptoms.   Special diets are not usually needed.   Keep any follow-up appointments and blood tests, as directed.    SEEK IMMEDIATE MEDICAL CARE IF:  You develop bright red, rectal bleeding; dark black, tarry stools; or vomit blood.   You become light-headed, weak, have fainting episodes, or become sweaty, cold and clammy.   You experience severe abdominal pain not controlled by medications.   SOFT MECHANICAL DIET This SOFT MECHANICAL DIET is restricted to:  Foods that are moist, soft-textured,  and easy to chew and swallow.   Meats that are ground or are minced no larger than one-quarter inch pieces. Meats are moist with gravy or sauce added.   Foods that do not include bread or bread-like textures except soft pancakes, well-moistened with syrup or sauce.   Textures with some chewing ability required.   Casseroles without rice.   Cooked vegetables that are less than half an inch in size and easily mashed with a fork. No cooked corn, peas, broccoli, cauliflower, cabbage, Brussels sprouts, asparagus, or other fibrous, non-tender or rubbery cooked vegetables.   Canned fruit except for pineapple. Fruit must be cut into pieces no larger than half an inch in size.   Foods that do not include nuts, seeds, coconut, or sticky textures.   FOOD TEXTURES FOR DYSPHAGIA DIET LEVEL 2 -SOFT MECHANICAL DIET (includes all foods on Dysphagia Diet Level 1 - Pureed, in addition to the foods listed below)  FOOD GROUP: Breads. RECOMMENDED: Soft pancakes, well-moistened with  syrup or sauce.  AVOID: All others.  FOOD GROUP: Cereals.  RECOMMENDED: Cooked cereals with little texture, including oatmeal. Unprocessed wheat bran stirred into cereals for bulk. Note: If thin liquids are restricted, it is important that all of the liquid is absorbed into the cereal.  AVOID: All dry cereals and any cooked cereals that may contain flax seeds or other seeds or nuts. Whole-grain, dry, or coarse cereals. Cereals with nuts, seeds, dried fruit, and/or coconut.  FOOD GROUP: Desserts. RECOMMENDED: Pudding, custard. Soft fruit pies with bottom crust only. Canned fruit (excluding pineapple). Soft, moist cakes with icing.Frozen malts, milk shakes, frozen yogurt, eggnog, nutritional supplements, ice cream, sherbet, regular or sugar-free gelatin, or any foods that become thin liquid at either room (70 F) or body temperature (98 F).  AVOID: Dry, coarse cakes and cookies. Anything with nuts, seeds, coconut, pineapple, or  dried fruit. Breakfast yogurt with nuts. Rice or bread pudding.  FOOD GROUP: Fats. RECOMMENDED: Butter, margarine, cream for cereal (depending on liquid consistency recommendations), gravy, cream sauces, sour cream, sour cream dips with soft additives, mayonnaise, salad dressings, cream cheese, cream cheese spreads with soft additives, whipped toppings.  AVOID: All fats with coarse or chunky additives.  FOOD GROUP: Fruits. RECOMMENDED: Soft drained, canned, or cooked fruits without seeds or skin. Fresh soft and ripe banana. Fruit juices with a small amount of pulp. If thin liquids are restricted, fruit juices should be thickened to appropriate consistency.  AVOID: Fresh or frozen fruits. Cooked fruit with skin or seeds. Dried fruits. Fresh, canned, or cooked pineapple.  FOOD GROUP: Meats and Meat Substitutes. (Meat pieces should not exceed 1/4 of an inch cube and should be tender.) RECOMMENDED: Moistened ground or cooked meat, poultry, or fish. Moist ground or tender meat may be served with gravy or sauce. Casseroles without rice. Moist macaroni and cheese, well-cooked pasta with meat sauce, tuna noodle casserole, soft, moist lasagna. Moist meatballs, meatloaf, or fish loaf. Protein salads, such as tuna or egg without large chunks, celery, or onion. Cottage cheese, smooth quiche without large chunks. Poached, scrambled, or soft-cooked eggs (egg yolks should not be runny but should be moist and able to be mashed with butter, margarine, or other moisture added to them). (Cook eggs to 160 F or use pasteurized eggs for safety.) Souffls may have small, soft chunks. Tofu. Well-cooked, slightly mashed, moist legumes, such as baked beans. All meats or protein substitutes should be served with sauces or moistened to help maintain cohesiveness in the oral cavity.  AVOID: Dry meats, tough meats (such as bacon, sausage, hot dogs, bratwurst). Dry casseroles or casseroles with rice or large chunks. Peanut  butter. Cheese slices and cubes. Hard-cooked or crisp fried eggs. Sandwiches.Pizza.  FOOD GROUP: Potatoes and Starches. RECOMMENDED: Well-cooked, moistened, boiled, baked, or mashed potatoes. Well-cooked shredded hash brown potatoes that are not crisp. (All potatoes need to be moist and in sauces.)Well-cooked noodles in sauce. Spaetzel or soft dumplings that have been moistened with butter or gravy.  AVOID: Potato skins and chips. Fried or French-fried potatoes. Rice.  FOOD GROUP: Soups. RECOMMENDED: Soups with easy-to-chew or easy-to-swallow meats or vegetables: Particle sizes in soups should be less than 1/2 inch. Soups will need to be thickened to appropriate consistency if soup is thinner than prescribed liquid consistency.  AVOID: Soups with large chunks of meat and vegetables. Soups with rice, corn, peas.  FOOD GROUP: Vegetables. RECOMMENDED: All soft, well-cooked vegetables. Vegetables should be less than a half inch. Should be easily mashed with a  fork.  AVOID: Cooked corn and peas. Broccoli, cabbage, Brussels sprouts, asparagus, or other fibrous, non-tender or rubbery cooked vegetables.  FOOD GROUP: Miscellaneous. RECOMMENDED: Jams and preserves without seeds, jelly. Sauces, salsas, etc., that may have small tender chunks less than 1/2 inch. Soft, smooth chocolate bars that are easily chewed.  AVOID: Seeds, nuts, coconut, or sticky foods. Chewy candies such as caramels or licorice.

## 2017-06-06 NOTE — Addendum Note (Signed)
Addendum  created 06/06/17 1052 by Mickel Baas, CRNA   Charge Capture section accepted

## 2017-06-06 NOTE — Progress Notes (Signed)
Urine results back. All negative. OK to proceed.

## 2017-06-06 NOTE — Anesthesia Preprocedure Evaluation (Signed)
Anesthesia Evaluation  Patient identified by MRN, date of birth, ID band Patient awake    Reviewed: Allergy & Precautions, NPO status , Patient's Chart, lab work & pertinent test results  Airway Mallampati: II  TM Distance: >3 FB Neck ROM: Full    Dental no notable dental hx. (+) Poor Dentition, Missing   Pulmonary Current Smoker,    Pulmonary exam normal breath sounds clear to auscultation       Cardiovascular negative cardio ROS Normal cardiovascular exam Rhythm:Regular Rate:Normal     Neuro/Psych  Headaches, PSYCHIATRIC DISORDERS Anxiety Depression Bipolar Disorder negative neurological ROS     GI/Hepatic PUD, (+)     substance abuse  ,   Endo/Other  negative endocrine ROSHypothyroidism   Renal/GU negative Renal ROS  negative genitourinary   Musculoskeletal negative musculoskeletal ROS (+)   Abdominal   Peds negative pediatric ROS (+)  Hematology negative hematology ROS (+) anemia ,   Anesthesia Other Findings   Reproductive/Obstetrics negative OB ROS                             Anesthesia Physical Anesthesia Plan  ASA: III  Anesthesia Plan: MAC   Post-op Pain Management:    Induction: Intravenous  PONV Risk Score and Plan:   Airway Management Planned: Simple Face Mask  Additional Equipment:   Intra-op Plan:   Post-operative Plan:   Informed Consent: I have reviewed the patients History and Physical, chart, labs and discussed the procedure including the risks, benefits and alternatives for the proposed anesthesia with the patient or authorized representative who has indicated his/her understanding and acceptance.     Plan Discussed with:   Anesthesia Plan Comments:         Anesthesia Quick Evaluation

## 2017-06-06 NOTE — Anesthesia Postprocedure Evaluation (Signed)
Anesthesia Post Note  Patient: Deanna Berry  Procedure(s) Performed: Procedure(s) (LRB): ESOPHAGOGASTRODUODENOSCOPY (EGD) WITH PROPOFOL (N/A) SAVORY DILATION (N/A) BIOPSY  Patient location during evaluation: PACU Anesthesia Type: MAC Level of consciousness: awake and oriented Pain management: pain level controlled Vital Signs Assessment: post-procedure vital signs reviewed and stable Respiratory status: spontaneous breathing Cardiovascular status: stable Postop Assessment: no signs of nausea or vomiting Anesthetic complications: no     Last Vitals:  Vitals:   06/06/17 0815 06/06/17 0820  BP: 123/83 103/75  Resp: 16 15  Temp:      Last Pain:  Vitals:   06/06/17 0643  TempSrc: Oral  PainSc: 7                  [name]

## 2017-06-06 NOTE — Progress Notes (Signed)
I&O cath performed using sterile technique to obtain urine pregnancy & drug screen.  Pt. Tolerated procedure well. Awaiting results.

## 2017-06-06 NOTE — Op Note (Signed)
[facility] Patient Name: Deanna Berry Procedure Date: 06/06/2017 7:09 AM MRN: [id number] Date of Birth: Aug 21, 1971 Attending MD: Barney Drain , MD CSN: [id number] Age: 46 Admit Type: Outpatient Procedure:                Upper GI endoscopy WITH COLD FORCEPS BIOPSY/TTS                            BALLOON DILATION Indications:              Dyspepsia Providers:                Barney Drain, MD, Lurline Del, RN, Randa Spike,                            Technician Referring MD:             Redmond School, MD Medicines:                Propofol per Anesthesia Complications:            No immediate complications. Estimated Blood Loss:     Estimated blood loss was minimal. Procedure:                Pre-Anesthesia Assessment:                           - Prior to the procedure, a History and Physical                            was performed, and patient medications and                            allergies were reviewed. The patient's tolerance of                            previous anesthesia was also reviewed. The risks                            and benefits of the procedure and the sedation                            options and risks were discussed with the patient.                            All questions were answered, and informed consent                            was obtained. Prior Anticoagulants: The patient has                            taken no previous anticoagulant or antiplatelet                            agents. ASA Grade Assessment: II - A patient with  mild systemic disease. After reviewing the risks                            and benefits, the patient was deemed in                            satisfactory condition to undergo the procedure.                            After obtaining informed consent, the endoscope was                            passed under direct vision. Throughout the                            procedure, the patient's blood  pressure, pulse, and                            oxygen saturations were monitored continuously. The                            EG-[id number] ([id number]) scope was introduced through the                            mouth, and advanced to the second part of duodenum.                            The upper GI endoscopy was technically difficult                            and complex due to stenosis. The patient tolerated                            the procedure well. Scope In: 8:57:47 AM Scope Out: 9:11:35 AM Total Procedure Duration: 0 hours 13 minutes 48 seconds  Findings:      The examined esophagus was normal.      A small hiatal hernia was present.      One non-bleeding cratered gastric ulcer with no stigmata of bleeding was       found at the pylorus.      A benign-appearing, intrinsic severe stenosis was found at the pylorus.       This was non-traversed. A TTS dilator was passed through the scope.       Dilation with TO12 mm ALLOWED SCOPED TO PASS. SUBSEQUENT DILATION TO       13.5 mm pyloric balloon dilator was performed. DILATION NOT COMPLETE TO       15 MM DUE TOP RESENCE OF PYLORIC CHANNEL ULCER.ACTIVE OOZING/MUCOSAL       TEAR SEEN ON NON ULCER PORTION OF PYLORUS AFTER DILATION. The dilation       site was examined following endoscope reinsertion and showed moderate       improvement in luminal narrowing. Estimated blood loss was minimal.      The examined duodenum was normal.      Patchy mild inflammation characterized by congestion (edema) and  erythema was found in the gastric antrum. Biopsies were taken with a       cold forceps for Helicobacter pylori testing. Impression:               - Small hiatal hernia.                           - Non-bleeding gastric ulcer with ASSOCIATED                            DEFORMED PYLORUS/GASTRIC OUTLET OBSTRUCTION. Moderate Sedation:      Per Anesthesia Care Recommendation:           - Soft diet.                           - Continue present  medications.                           - Await pathology results.                           - Repeat upper endoscopy in 2 months for                            retreatment and for surveillance.                           - Return to GI office in 6 months.                           - Patient has a contact number available for                            emergencies. The signs and symptoms of potential                            delayed complications were discussed with the                            patient. Return to normal activities tomorrow.                            Written discharge instructions were provided to the                            patient. Procedure Code(s):        --- Professional ---                           [phone], Esophagogastroduodenoscopy, flexible,                            transoral; with dilation of gastric/duodenal                            stricture(s) (eg, balloon, bougie)  94076, Esophagogastroduodenoscopy, flexible,                            transoral; with biopsy, single or multiple Diagnosis Code(s):        --- Professional ---                           K44.9, Diaphragmatic hernia without obstruction or                            gangrene                           K25.9, Gastric ulcer, unspecified as acute or                            chronic, without hemorrhage or perforation                           K31.1, Adult hypertrophic pyloric stenosis                           R10.13, Epigastric pain CPT copyright 2016 [employer]. All rights reserved. The codes documented in this report are preliminary and upon coder review may  be revised to meet current compliance requirements. Barney Drain, MD Barney Drain, MD 06/06/2017 9:29:14 AM This report has been signed electronically. Number of Addenda: 0

## 2017-06-06 NOTE — Transfer of Care (Signed)
Immediate Anesthesia Transfer of Care Note  Patient: Deanna Berry  Procedure(s) Performed: Procedure(s) with comments: ESOPHAGOGASTRODUODENOSCOPY (EGD) WITH PROPOFOL (N/A) - 7:30am SAVORY DILATION (N/A) BIOPSY - gastric  Patient Location: PACU  Anesthesia Type:MAC  Level of Consciousness: awake, oriented and patient cooperative  Airway & Oxygen Therapy: Patient Spontanous Breathing and Patient connected to face mask oxygen  Post-op Assessment: Report given to RN and Post -op Vital signs reviewed and stable  Post vital signs: Reviewed and stable  Last Vitals:  Vitals:   06/06/17 0815 06/06/17 0820  BP: 123/83 103/75  Resp: 16 15  Temp:      Last Pain:  Vitals:   06/06/17 0643  TempSrc: Oral  PainSc: 7          Complications: No apparent anesthesia complications

## 2017-06-07 NOTE — Telephone Encounter (Signed)
PT is aware.

## 2017-06-07 NOTE — Telephone Encounter (Signed)
PLEASE CALL PT. IF HER URIN IS DARK SHE SHOULD DRINK MORE WATER TO KEEP HER URINE LIGHT YELLOW. SEE PCP FOR BACK PAIN.

## 2017-06-08 NOTE — Progress Notes (Signed)
Deanna Berry, please advise in Dr. Nona Dell absence.

## 2017-06-12 ENCOUNTER — Telehealth: Payer: Self-pay | Admitting: Gastroenterology

## 2017-06-12 ENCOUNTER — Emergency Department (HOSPITAL_COMMUNITY): Payer: BLUE CROSS/BLUE SHIELD

## 2017-06-12 ENCOUNTER — Observation Stay (HOSPITAL_COMMUNITY)
Admission: EM | Admit: 2017-06-12 | Discharge: 2017-06-14 | Disposition: A | Discharge status: Home / Self Care | Payer: BLUE CROSS/BLUE SHIELD | Attending: Internal Medicine | Admitting: Internal Medicine

## 2017-06-12 ENCOUNTER — Encounter (HOSPITAL_COMMUNITY): Payer: Self-pay

## 2017-06-12 DIAGNOSIS — R109 Unspecified abdominal pain: Secondary | ICD-10-CM | POA: Diagnosis present

## 2017-06-12 DIAGNOSIS — R319 Hematuria, unspecified: Secondary | ICD-10-CM | POA: Diagnosis not present

## 2017-06-12 DIAGNOSIS — Z79899 Other long term (current) drug therapy: Secondary | ICD-10-CM | POA: Diagnosis not present

## 2017-06-12 DIAGNOSIS — E039 Hypothyroidism, unspecified: Secondary | ICD-10-CM | POA: Diagnosis not present

## 2017-06-12 DIAGNOSIS — R1084 Generalized abdominal pain: Secondary | ICD-10-CM

## 2017-06-12 DIAGNOSIS — K279 Peptic ulcer, site unspecified, unspecified as acute or chronic, without hemorrhage or perforation: Secondary | ICD-10-CM | POA: Diagnosis present

## 2017-06-12 DIAGNOSIS — R310 Gross hematuria: Secondary | ICD-10-CM | POA: Diagnosis not present

## 2017-06-12 DIAGNOSIS — N179 Acute kidney failure, unspecified: Secondary | ICD-10-CM | POA: Diagnosis present

## 2017-06-12 DIAGNOSIS — F17200 Nicotine dependence, unspecified, uncomplicated: Secondary | ICD-10-CM | POA: Diagnosis present

## 2017-06-12 DIAGNOSIS — K5900 Constipation, unspecified: Secondary | ICD-10-CM | POA: Diagnosis present

## 2017-06-12 DIAGNOSIS — F17210 Nicotine dependence, cigarettes, uncomplicated: Secondary | ICD-10-CM | POA: Diagnosis not present

## 2017-06-12 DIAGNOSIS — F319 Bipolar disorder, unspecified: Secondary | ICD-10-CM | POA: Diagnosis not present

## 2017-06-12 DIAGNOSIS — R1032 Left lower quadrant pain: Secondary | ICD-10-CM | POA: Diagnosis not present

## 2017-06-12 HISTORY — DX: Constipation, unspecified: K59.00

## 2017-06-12 LAB — URINALYSIS, ROUTINE W REFLEX MICROSCOPIC

## 2017-06-12 LAB — URINALYSIS, MICROSCOPIC (REFLEX): Squamous Epithelial / LPF: NONE SEEN

## 2017-06-12 LAB — RAPID URINE DRUG SCREEN, HOSP PERFORMED
AMPHETAMINES: NOT DETECTED
Barbiturates: NOT DETECTED
Benzodiazepines: POSITIVE — AB
Cocaine: NOT DETECTED
OPIATES: NOT DETECTED
Tetrahydrocannabinol: NOT DETECTED

## 2017-06-12 LAB — BASIC METABOLIC PANEL
ANION GAP: 13 (ref 5–15)
BUN: 29 mg/dL — AB (ref 6–20)
CHLORIDE: 107 mmol/L (ref 101–111)
CO2: 16 mmol/L — AB (ref 22–32)
Calcium: 8.9 mg/dL (ref 8.9–10.3)
Creatinine, Ser: 1.93 mg/dL — ABNORMAL HIGH (ref 0.44–1.00)
GFR calc Af Amer: 35 mL/min — ABNORMAL LOW (ref >60)
GFR calc non Af Amer: 30 mL/min — ABNORMAL LOW (ref >60)
GLUCOSE: 109 mg/dL — AB (ref 65–99)
POTASSIUM: 4.2 mmol/L (ref 3.5–5.1)
Sodium: 136 mmol/L (ref 135–145)

## 2017-06-12 LAB — CBC WITH DIFFERENTIAL/PLATELET
BASOS ABS: 0.1 10*3/uL (ref 0.0–0.1)
Basophils Relative: 1 %
Eosinophils Absolute: 0.3 10*3/uL (ref 0.0–0.7)
Eosinophils Relative: 3 %
HEMATOCRIT: 34.5 % — AB (ref 36.0–46.0)
Hemoglobin: 11.5 g/dL — ABNORMAL LOW (ref 12.0–15.0)
LYMPHS ABS: 1.4 10*3/uL (ref 0.7–4.0)
LYMPHS PCT: 16 %
MCH: 27.4 pg (ref 26.0–34.0)
MCHC: 33.3 g/dL (ref 30.0–36.0)
MCV: 82.3 fL (ref 78.0–100.0)
Monocytes Absolute: 0.6 10*3/uL (ref 0.1–1.0)
Monocytes Relative: 7 %
NEUTROS ABS: 6.5 10*3/uL (ref 1.7–7.7)
Neutrophils Relative %: 73 %
Platelets: 382 10*3/uL (ref 150–400)
RBC: 4.19 MIL/uL (ref 3.87–5.11)
RDW: 18.3 % — ABNORMAL HIGH (ref 11.5–15.5)
WBC: 8.8 10*3/uL (ref 4.0–10.5)

## 2017-06-12 LAB — PREGNANCY, URINE: PREG TEST UR: NEGATIVE

## 2017-06-12 LAB — I-STAT CHEM 8, ED
BUN: 37 mg/dL — AB (ref 6–20)
CALCIUM ION: 0.96 mmol/L — AB (ref 1.15–1.40)
CREATININE: 2.1 mg/dL — AB (ref 0.44–1.00)
Chloride: 114 mmol/L — ABNORMAL HIGH (ref 101–111)
GLUCOSE: 119 mg/dL — AB (ref 65–99)
HCT: 35 % — ABNORMAL LOW (ref 36.0–46.0)
Hemoglobin: 11.9 g/dL — ABNORMAL LOW (ref 12.0–15.0)
Potassium: 4.7 mmol/L (ref 3.5–5.1)
Sodium: 135 mmol/L (ref 135–145)
TCO2: 16 mmol/L (ref 0–100)

## 2017-06-12 MED ORDER — CARBAMAZEPINE ER 200 MG PO TB12
200.0000 mg | ORAL_TABLET | Freq: Two times a day (BID) | ORAL | Status: DC
  Administered 2017-06-12: 200 mg via ORAL
  Filled 2017-06-12 (×4): qty 1

## 2017-06-12 MED ORDER — SODIUM CHLORIDE 0.9 % IV SOLN
INTRAVENOUS | Status: DC
  Administered 2017-06-12 – 2017-06-14 (×5): via INTRAVENOUS

## 2017-06-12 MED ORDER — CARBAMAZEPINE ER 100 MG PO TB12
ORAL_TABLET | ORAL | Status: AC
  Filled 2017-06-12: qty 2

## 2017-06-12 MED ORDER — CARBAMAZEPINE ER 200 MG PO TB12
400.0000 mg | ORAL_TABLET | Freq: Two times a day (BID) | ORAL | Status: DC
  Administered 2017-06-12: 400 mg via ORAL
  Filled 2017-06-12 (×4): qty 2

## 2017-06-12 MED ORDER — ONDANSETRON HCL 4 MG PO TABS
4.0000 mg | ORAL_TABLET | Freq: Four times a day (QID) | ORAL | Status: DC | PRN
Start: 2017-06-12 — End: 2017-06-14

## 2017-06-12 MED ORDER — PNEUMOCOCCAL VAC POLYVALENT 25 MCG/0.5ML IJ INJ
0.5000 mL | INJECTION | INTRAMUSCULAR | Status: AC
  Administered 2017-06-13: 0.5 mL via INTRAMUSCULAR
  Filled 2017-06-12: qty 0.5

## 2017-06-12 MED ORDER — GABAPENTIN 400 MG PO CAPS
800.0000 mg | ORAL_CAPSULE | Freq: Three times a day (TID) | ORAL | Status: DC
Start: 2017-06-12 — End: 2017-06-14
  Administered 2017-06-12 – 2017-06-14 (×6): 800 mg via ORAL
  Filled 2017-06-12 (×6): qty 2

## 2017-06-12 MED ORDER — HYDROMORPHONE HCL 1 MG/ML IJ SOLN
1.0000 mg | Freq: Once | INTRAMUSCULAR | Status: AC
  Administered 2017-06-12: 1 mg via INTRAVENOUS
  Filled 2017-06-12: qty 1

## 2017-06-12 MED ORDER — CARBAMAZEPINE ER 100 MG PO TB12
ORAL_TABLET | ORAL | Status: AC
  Filled 2017-06-12: qty 4

## 2017-06-12 MED ORDER — SODIUM CHLORIDE 0.9 % IV BOLUS (SEPSIS)
1000.0000 mL | Freq: Once | INTRAVENOUS | Status: AC
  Administered 2017-06-12: 1000 mL via INTRAVENOUS

## 2017-06-12 MED ORDER — DEXTROSE 5 % IV SOLN
1.0000 g | INTRAVENOUS | Status: DC
  Administered 2017-06-13: 1 g via INTRAVENOUS
  Filled 2017-06-12 (×2): qty 10

## 2017-06-12 MED ORDER — LEVOTHYROXINE SODIUM 75 MCG PO TABS
75.0000 ug | ORAL_TABLET | Freq: Every day | ORAL | Status: DC
  Administered 2017-06-13: 75 ug via ORAL
  Filled 2017-06-12: qty 1

## 2017-06-12 MED ORDER — ACETAMINOPHEN 650 MG RE SUPP: 650.0000 mg | Freq: Four times a day (QID) | RECTAL | Status: DC | PRN

## 2017-06-12 MED ORDER — QUETIAPINE FUMARATE 100 MG PO TABS
300.0000 mg | ORAL_TABLET | Freq: Every day | ORAL | Status: DC
  Administered 2017-06-12 – 2017-06-13 (×2): 300 mg via ORAL
  Filled 2017-06-12 (×2): qty 3

## 2017-06-12 MED ORDER — DEXTROSE 5 % IV SOLN
1.0000 g | Freq: Once | INTRAVENOUS | Status: AC
  Administered 2017-06-12: 1 g via INTRAVENOUS
  Filled 2017-06-12: qty 10

## 2017-06-12 MED ORDER — VORTIOXETINE HBR 20 MG PO TABS
20.0000 mg | ORAL_TABLET | Freq: Every day | ORAL | Status: DC
  Administered 2017-06-13 – 2017-06-14 (×2): 20 mg via ORAL
  Filled 2017-06-12 (×4): qty 20

## 2017-06-12 MED ORDER — PANTOPRAZOLE SODIUM 40 MG PO TBEC
40.0000 mg | DELAYED_RELEASE_TABLET | Freq: Two times a day (BID) | ORAL | Status: DC
  Administered 2017-06-13 – 2017-06-14 (×4): 40 mg via ORAL
  Filled 2017-06-12 (×4): qty 1

## 2017-06-12 MED ORDER — PROCHLORPERAZINE EDISYLATE 5 MG/ML IJ SOLN: 5.0000 mg | INTRAMUSCULAR | Status: DC | PRN

## 2017-06-12 MED ORDER — NICOTINE 14 MG/24HR TD PT24
14.0000 mg | MEDICATED_PATCH | Freq: Every day | TRANSDERMAL | Status: DC
  Administered 2017-06-12 – 2017-06-14 (×3): 14 mg via TRANSDERMAL
  Filled 2017-06-12 (×3): qty 1

## 2017-06-12 MED ORDER — DOCUSATE SODIUM 100 MG PO CAPS
100.0000 mg | ORAL_CAPSULE | Freq: Two times a day (BID) | ORAL | Status: DC
  Administered 2017-06-13 – 2017-06-14 (×2): 100 mg via ORAL
  Filled 2017-06-12 (×2): qty 1

## 2017-06-12 MED ORDER — ONDANSETRON HCL 4 MG/2ML IJ SOLN
4.0000 mg | Freq: Four times a day (QID) | INTRAMUSCULAR | Status: DC | PRN
  Administered 2017-06-13: 4 mg via INTRAVENOUS
  Filled 2017-06-12: qty 2

## 2017-06-12 MED ORDER — ACETAMINOPHEN 325 MG PO TABS: 650.0000 mg | ORAL_TABLET | Freq: Four times a day (QID) | ORAL | Status: DC | PRN

## 2017-06-12 MED ORDER — LINACLOTIDE 145 MCG PO CAPS
290.0000 ug | ORAL_CAPSULE | Freq: Every day | ORAL | Status: DC
  Administered 2017-06-13 – 2017-06-14 (×2): 290 ug via ORAL
  Filled 2017-06-12 (×2): qty 2

## 2017-06-12 MED ORDER — OXYCODONE-ACETAMINOPHEN 5-325 MG PO TABS
1.0000 | ORAL_TABLET | Freq: Once | ORAL | Status: AC
  Administered 2017-06-12: 1 via ORAL
  Filled 2017-06-12: qty 1

## 2017-06-12 NOTE — ED Provider Notes (Signed)
[facility] DEPT Provider Note   CSN: [id number] Arrival date & time: 06/12/17  1210     History   Chief Complaint Chief Complaint  Patient presents with  . Abdominal Pain  . Hematuria    HPI Deanna Berry is a 46 y.o. female.  HPI  70 showed female with a history of bipolar, depression, peptic ulcer disease and urinary tract infections presents to the emergency department today with approximately 2 weeks of progressively worsening suprapubic pain bilateral lower quadrant abdominal pain and bilateral flank pain with hematuria. States she's had symptoms similar to this when she had a urinary tract infection the past. No fevers, nausea or vomiting. No rashes. Has had some pain with urination but nothing severe. No other associated or modifying symptoms. No GI or GU changes.  Past Medical History:  Diagnosis Date  . Anemia   . Anxiety   . Bipolar disorder ([place])   . Cleft palate   . Constipation   . Depression   . Headache(784.0)   . Hypothyroidism   . PUD (peptic ulcer disease)   . Sinus congestion   . Substance abuse     Patient Active Problem List   Diagnosis Date Noted  . Hematuria 06/12/2017  . Acute renal failure (ARF) ([place]) 06/12/2017  . Tobacco dependence 06/12/2017  . Stenosis, pylorus   . Proteinuria 05/22/2017  . PUD (peptic ulcer disease) 05/02/2017  . AKI (acute kidney injury) ([place]) 03/22/2017  . Hypokalemia 03/22/2017  . Hyperbilirubinemia 03/22/2017  . Ataxia 03/22/2017  . Hypothyroidism 03/22/2017  . Acute cystitis without hematuria   . Anemia 03/10/2017  . Constipation 03/10/2017  . Abdominal pain 02/23/2017  . Bipolar 1 disorder ([place]) 05/31/2012  . Substance abuse 05/31/2012    Past Surgical History:  Procedure Laterality Date  . CESAREAN SECTION    . CLEFT PALATE REPAIR  2013  . COLONOSCOPY  2016   Dr. Britta Mccreedy: normal   . ESOPHAGOGASTRODUODENOSCOPY  03/2015   Dr. Britta Mccreedy: esophagitis, 2 medium-sized ulcers in antrum and pylorus, large  ulcer in duodenal bulb, hiatal hernia, no specimens collected.   . ESOPHAGOGASTRODUODENOSCOPY  06/2015   Dr. Britta Mccreedy: small pyloric channel ulcer with mild pyloric stenosis, previous gastric and duodenal ulcers completely healed   . ESOPHAGOGASTRODUODENOSCOPY (EGD) WITH PROPOFOL N/A 03/24/2017   [facility]: Z-line regular, small hiatal hernia, non-bleeding large clean-based pyloric channel ulcer with narrowing of the pyloric channel, unable to advance scope into dudoenum, chronic gastritis  . HERNIA REPAIR    . MULTIPLE TOOTH EXTRACTIONS      OB History    No data available       Home Medications    Prior to Admission medications   Medication Sig Start Date End Date Taking? Authorizing Provider  carbamazepine (TEGRETOL XR) 200 MG 12 hr tablet Take 200 mg by mouth 2 (two) times daily.  02/10/17  Yes [provider]  carbamazepine (TEGRETOL XR) 400 MG 12 hr tablet Take 400 mg by mouth 2 (two) times daily. 05/19/17  Yes [provider]  gabapentin (NEURONTIN) 800 MG tablet Take 800 mg by mouth 3 (three) times daily. 01/30/17  Yes [provider]  levothyroxine (SYNTHROID, LEVOTHROID) 75 MCG tablet Take 75 mcg by mouth daily before breakfast.  03/21/17  Yes [provider]  linaclotide (LINZESS) 290 MCG CAPS capsule Take 1 capsule (290 mcg total) by mouth daily before breakfast. 03/28/17  Yes Annitta Needs, NP  pantoprazole (PROTONIX) 40 MG tablet Take 1 tablet (40 mg total) by  mouth 2 (two) times daily before a meal. 03/30/17  Yes Carlis Stable, NP  QUEtiapine (SEROQUEL) 300 MG tablet Take 300 mg by mouth at bedtime.   Yes [provider]  TRINTELLIX 20 MG TABS Take 20 mg by mouth daily. 02/10/17  Yes [provider]    Family History Family History  Problem Relation Age of Onset  . Colon cancer Mother 23    Social History Social History  Substance Use Topics  . Smoking status: Current Every Day Smoker    Packs/day: 1.00    Years: 31.00     Types: Cigarettes  . Smokeless tobacco: Never Used  . Alcohol use No     Allergies   Amoxil [amoxicillin]; Adhesive [tape]; and Neosporin original [bacitracin-neomycin-polymyxin]   Review of Systems Review of Systems  All other systems reviewed and are negative.    Physical Exam Updated Vital Signs BP (!) 136/91 (BP Location: Left Arm)   Pulse 90   Temp 98.6 F (37 C) (Oral)   Resp 16   Ht 5' (1.524 m)   Wt 47.6 kg (105 lb)   SpO2 98%   BMI 20.51 kg/m   Physical Exam  Constitutional: She is oriented to person, place, and time. She appears well-developed and well-nourished.  HENT:  Head: Normocephalic and atraumatic.  Eyes: Conjunctivae and EOM are normal.  Neck: Normal range of motion.  Cardiovascular: Normal rate and regular rhythm.   Pulmonary/Chest: Effort normal and breath sounds normal. No stridor. No respiratory distress.  Abdominal: Soft. She exhibits no distension. There is no tenderness.  Musculoskeletal: Normal range of motion. She exhibits no edema, tenderness or deformity.  Neurological: She is alert and oriented to person, place, and time. No cranial nerve deficit. Coordination normal.  Skin: Skin is warm and dry.  Nursing note and vitals reviewed.    ED Treatments / Results  Labs (all labs ordered are listed, but only abnormal results are displayed) Labs Reviewed  URINALYSIS, ROUTINE W REFLEX MICROSCOPIC - Abnormal; Notable for the following:       Result Value   Color, Urine RED (*)    APPearance CLOUDY (*)    Glucose, UA   (*)    Value: TEST NOT REPORTED DUE TO COLOR INTERFERENCE OF URINE PIGMENT   Hgb urine dipstick   (*)    Value: TEST NOT REPORTED DUE TO COLOR INTERFERENCE OF URINE PIGMENT   Bilirubin Urine   (*)    Value: TEST NOT REPORTED DUE TO COLOR INTERFERENCE OF URINE PIGMENT   Ketones, ur   (*)    Value: TEST NOT REPORTED DUE TO COLOR INTERFERENCE OF URINE PIGMENT   Protein, ur   (*)    Value: TEST NOT REPORTED DUE TO COLOR  INTERFERENCE OF URINE PIGMENT   Nitrite   (*)    Value: TEST NOT REPORTED DUE TO COLOR INTERFERENCE OF URINE PIGMENT   Leukocytes, UA   (*)    Value: TEST NOT REPORTED DUE TO COLOR INTERFERENCE OF URINE PIGMENT   All other components within normal limits  URINALYSIS, MICROSCOPIC (REFLEX) - Abnormal; Notable for the following:    Bacteria, UA MANY (*)    All other components within normal limits  CBC WITH DIFFERENTIAL/PLATELET - Abnormal; Notable for the following:    Hemoglobin 11.5 (*)    HCT 34.5 (*)    RDW 18.3 (*)    All other components within normal limits  BASIC METABOLIC PANEL - Abnormal; Notable for the following:  CO2 16 (*)    Glucose, Bld 109 (*)    BUN 29 (*)    Creatinine, Ser 1.93 (*)    GFR calc non Af Amer 30 (*)    GFR calc Af Amer 35 (*)    All other components within normal limits  I-STAT CHEM 8, ED - Abnormal; Notable for the following:    Chloride 114 (*)    BUN 37 (*)    Creatinine, Ser 2.10 (*)    Glucose, Bld 119 (*)    Calcium, Ion 0.96 (*)    Hemoglobin 11.9 (*)    HCT 35.0 (*)    All other components within normal limits  URINE CULTURE  PREGNANCY, URINE  BASIC METABOLIC PANEL  CBC  TSH  T4, FREE  RAPID URINE DRUG SCREEN, HOSP PERFORMED    EKG  EKG Interpretation None       Radiology Ct Renal Stone Study  Result Date: 06/12/2017 CLINICAL DATA:  Upper abdominal pain radiates into the flank area for 2 weeks. Hematuria. EXAM: CT ABDOMEN AND PELVIS WITHOUT CONTRAST TECHNIQUE: Multidetector CT imaging of the abdomen and pelvis was performed following the standard protocol without IV contrast. COMPARISON:  02/23/2017.  03/26/2015. FINDINGS: Lower chest: 8 mm cavitary lesion posterior right costophrenic sulcus unchanged since 03/26/2015 compatible with benign etiology. Hepatobiliary: No focal abnormality in the liver on this study without intravenous contrast. Gallbladder is markedly distended measuring 5.6 cm in diameter. No intrahepatic or  extrahepatic biliary dilation. Pancreas: No focal mass lesion. No dilatation of the main duct. No intraparenchymal cyst. No peripancreatic edema. Spleen: No splenomegaly. No focal mass lesion. Adrenals/Urinary Tract: No adrenal nodule or mass. Mild fullness of the right intrarenal collecting system is borderline for mild hydronephrosis. There is some layering high attenuation material in a lower pole calyx. Ureter is upper normal diameter throughout. Similar mild fullness noted in the left kidney with layering high attenuation material in the lower pole calyx of this kidney is well. Although left ureter is less distended than on the right, there is a focal area of high density in the distal left ureter (image 74 series 2 and image 48 series 5) that is probably a tiny distal left ureteral stone. The urinary bladder appears normal for the degree of distention.  ) Stomach/Bowel: Stomach is nondistended. No gastric wall thickening. No evidence of outlet obstruction. Duodenum is normally positioned as is the ligament of Treitz. No small bowel wall thickening. No small bowel dilatation. The terminal ileum is normal. The appendix is normal. Slightly increased attenuation of colonic contents may be trace residua from the upper GI series of 05/18/2017. Vascular/Lymphatic: There is abdominal aortic atherosclerosis without aneurysm. Upper normal lymph nodes are seen in the para-aortic space. No pelvic sidewall lymphadenopathy. Reproductive: The uterus has normal CT imaging appearance. There is no adnexal mass. Other: No intraperitoneal free fluid. Musculoskeletal: Bone windows reveal no worrisome lytic or sclerotic osseous lesions. IMPRESSION: 1. Mild fullness of both intrarenal collecting systems, right slightly more than left with associated mild right ureteral fullness. There is high attenuation material and lower pole collecting systems of each kidney which may represent hemorrhage or infectious debris. These do not appear  quite focal enough to represent stones. 2. Tiny focus of high attenuation in the distal left ureter likely a small stone although a focal collection of hemorrhage or debris is possible. 3. Marked gallbladder distention without associated biliary dilatation. Electronically Signed   By: Misty Stanley M.D.   On: 06/12/2017 16:23  Procedures Procedures (including critical care time)  Medications Ordered in ED Medications  QUEtiapine (SEROQUEL) tablet 300 mg (300 mg Oral Given 06/12/17 2036)  carbamazepine (TEGRETOL XR) 12 hr tablet 400 mg (400 mg Oral Given 06/12/17 2212)  pantoprazole (PROTONIX) EC tablet 40 mg (not administered)  linaclotide (LINZESS) capsule 290 mcg (not administered)  levothyroxine (SYNTHROID, LEVOTHROID) tablet 75 mcg (not administered)  carbamazepine (TEGRETOL XR) 12 hr tablet 200 mg (200 mg Oral Given 06/12/17 2218)  gabapentin (NEURONTIN) capsule 800 mg (800 mg Oral Given 06/12/17 2035)  vortioxetine HBr (TRINTELLIX) 20 MG tablet 20 mg (not administered)  acetaminophen (TYLENOL) tablet 650 mg (not administered)    Or  acetaminophen (TYLENOL) suppository 650 mg (not administered)  docusate sodium (COLACE) capsule 100 mg (100 mg Oral Not Given 06/12/17 2200)  ondansetron (ZOFRAN) tablet 4 mg (not administered)    Or  ondansetron (ZOFRAN) injection 4 mg (not administered)  0.9 %  sodium chloride infusion ( Intravenous Restarted 06/12/17 1900)  prochlorperazine (COMPAZINE) injection 5 mg (not administered)  nicotine (NICODERM CQ - dosed in mg/24 hours) patch 14 mg (14 mg Transdermal Patch Applied 06/12/17 2209)  pneumococcal 23 valent vaccine (PNU-IMMUNE) injection 0.5 mL (not administered)  cefTRIAXone (ROCEPHIN) 1 g in dextrose 5 % 50 mL IVPB (not administered)  oxyCODONE-acetaminophen (PERCOCET/ROXICET) 5-325 MG per tablet 1 tablet (1 tablet Oral Given 06/12/17 1309)  cefTRIAXone (ROCEPHIN) 1 g in dextrose 5 % 50 mL IVPB (0 g Intravenous Stopped 06/12/17 1513)  sodium  chloride 0.9 % bolus 1,000 mL (1,000 mLs Intravenous New Bag/Given 06/12/17 1729)  HYDROmorphone (DILAUDID) injection 1 mg (1 mg Intravenous Given 06/12/17 2036)     Initial Impression / Assessment and Plan / ED Course  I have reviewed the triage vital signs and the nursing notes.  Pertinent labs & imaging results that were available during my care of the patient were reviewed by me and considered in my medical decision making (see chart for details).  Will eval for likely UTI. If negative, will consider stone or intrinsic kidney disease.      Workup concerning for UTI possibly and the acute kidney injury. Discussed with hospitalist who asked for urology recommendations. Discussed with Dr. Jeffie Pollock on the phone who stated that the patient continue on antibiotics and fluid hydration and they would see her in the morning otherwise CT abdomen pelvis with contrast if her kidney function is improving.  Final Clinical Impressions(s) / ED Diagnoses   Final diagnoses:  Hematuria, unspecified type  AKI (acute kidney injury) ([place])     Dene Nazir, Corene Cornea, MD 06/12/17 2223

## 2017-06-12 NOTE — Progress Notes (Signed)
Pt refusing to wear SCDs. RN educated on need for SCDs and DVT prevention, pt verbalized understanding and stated she doesn't need them because she is up to BR and ad lib in room.  Pt made aware that RN needs urine specimen.

## 2017-06-12 NOTE — Telephone Encounter (Signed)
I called pt and she started crying and said " can you help me". I asked what the problem was and she said she is hurting in her ribs and her urine is RED. I asked which ribs and she said on both sides.  She denied any abdominal pain to me.  She sees [facility], and I told her she should see PCP for those problems.  She started crying and asked could we see her and I told her we do not have any appts today, and also as I stated this is PCP problem. She was crying and states her pain is at a 10. So I told her she should go to the ED if pain is that severe and she has RED urine.

## 2017-06-12 NOTE — Telephone Encounter (Signed)
Agree. She has a history of renal issues. Needs to go to ED if gross hematuria noted.

## 2017-06-12 NOTE — H&P (Addendum)
History and Physical    Deanna Berry QAS:[id number] DOB: Feb 18, 1971 DOA: 06/12/2017  PCP: Redmond School, MD Consultants:  Oneida Alar - GI Patient coming from: Home - lives with 46yo son; NOK: father, Delfino Lovett [phone]  Chief Complaint: abdominal pain, hematuria  HPI: Deanna Berry is a 46 y.o. female with medical history significant of remote substance abuse, PUD, hypothyroidism, bipolar, cleft palate, and constipation presenting with abdominal pain and hematuria.  She reports significant pain under ribs, along lower abdomen and into her back.  It is constant and has been going on for a couple of weeks.  She has not been having much energy lately.  She has had an ulcer in her stomach for 6 months, but she does not think this is her ulcer pain.  Occasionally pain gets better if she can get into a very certain position but it doesn't last long - she is constantly trying to find a comfortable postiion.  It seems to be worse upon awakening first thing in the morning.  +gross hematuria for a week.  Looks like when she has a period - but she no longer has periods.  No dysuria but she can feel the urine coming out more.  Voiding less frequently.  No changes in starting or stopping her stream.  No fevers.  ED Course:  Concern for UTI, stone, intrinsic kidney disease.  Given Rocephin for UTI.  Review of Systems: As per HPI; otherwise review of systems reviewed and negative.   Ambulatory Status:  Ambulates without assistance  Past Medical History:  Diagnosis Date  . Anemia   . Anxiety   . Bipolar disorder ([place])   . Cleft palate   . Constipation   . Depression   . Headache(784.0)   . Hypothyroidism   . PUD (peptic ulcer disease)   . Sinus congestion   . Substance abuse     Past Surgical History:  Procedure Laterality Date  . CESAREAN SECTION    . CLEFT PALATE REPAIR  2013  . COLONOSCOPY  2016   Dr. Britta Mccreedy: normal   . ESOPHAGOGASTRODUODENOSCOPY  03/2015   Dr. Britta Mccreedy: esophagitis, 2  medium-sized ulcers in antrum and pylorus, large ulcer in duodenal bulb, hiatal hernia, no specimens collected.   . ESOPHAGOGASTRODUODENOSCOPY  06/2015   Dr. Britta Mccreedy: small pyloric channel ulcer with mild pyloric stenosis, previous gastric and duodenal ulcers completely healed   . ESOPHAGOGASTRODUODENOSCOPY (EGD) WITH PROPOFOL N/A 03/24/2017   [facility]: Z-line regular, small hiatal hernia, non-bleeding large clean-based pyloric channel ulcer with narrowing of the pyloric channel, unable to advance scope into dudoenum, chronic gastritis  . HERNIA REPAIR    . MULTIPLE TOOTH EXTRACTIONS      Social History   Social History  . Marital status: Single    Spouse name: N/A  . Number of children: N/A  . Years of education: N/A   Occupational History  . office manager    Social History Main Topics  . Smoking status: Current Every Day Smoker    Packs/day: 1.00    Years: 31.00    Types: Cigarettes  . Smokeless tobacco: Never Used  . Alcohol use No  . 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 Sexual activity: Not on file   Other Topics Concern  . Not on file   Social History Narrative  . No narrative on file    Allergies  Allergen Reactions  . Amoxil [Amoxicillin] Hives    Has patient had  a PCN reaction causing immediate rash, facial/tongue/throat swelling, SOB or lightheadedness with hypotension: No Has patient had a PCN reaction causing severe rash involving mucus membranes or skin necrosis: No Has patient had a PCN reaction that required hospitalization: No Has patient had a PCN reaction occurring within the last 10 years: No If all of the above answers are "NO", then may proceed with Cephalosporin use.   . Adhesive [Tape] Rash  . Neosporin Original [Bacitracin-Neomycin-Polymyxin] Rash    Family History  Problem Relation Age of Onset  . Colon cancer Mother 41    Prior to Admission medications   Medication Sig Start Date End Date Taking?  Authorizing Provider  carbamazepine (TEGRETOL XR) 200 MG 12 hr tablet Take 200 mg by mouth 2 (two) times daily.  02/10/17  Yes [provider]  carbamazepine (TEGRETOL XR) 400 MG 12 hr tablet Take 400 mg by mouth 2 (two) times daily. 05/19/17  Yes [provider]  gabapentin (NEURONTIN) 800 MG tablet Take 800 mg by mouth 3 (three) times daily. 01/30/17  Yes [provider]  levothyroxine (SYNTHROID, LEVOTHROID) 75 MCG tablet Take 75 mcg by mouth daily before breakfast.  03/21/17  Yes [provider]  linaclotide (LINZESS) 290 MCG CAPS capsule Take 1 capsule (290 mcg total) by mouth daily before breakfast. 03/28/17  Yes Annitta Needs, NP  pantoprazole (PROTONIX) 40 MG tablet Take 1 tablet (40 mg total) by mouth 2 (two) times daily before a meal. 03/30/17  Yes Carlis Stable, NP  QUEtiapine (SEROQUEL) 300 MG tablet Take 300 mg by mouth at bedtime.   Yes [provider]  TRINTELLIX 20 MG TABS Take 20 mg by mouth daily. 02/10/17  Yes [provider]    Physical Exam: Vitals:   06/12/17 1222 06/12/17 1223 06/12/17 1642 06/12/17 1900  BP:   104/86 124/82  Pulse: (!) 109  92 89  Resp: 17  18 18   Temp: 98 F (36.7 C)   (!) 97.5 F (36.4 C)  TempSrc: Oral   Oral  SpO2: 100%  100% 100%  Weight:  47.6 kg (105 lb)    Height:  5' (1.524 m)       General: Appears calm and comfortable and is NAD Eyes:  PERRL, EOMI, normal lids, iris ENT:  grossly normal hearing, cleft palate s/p repair, making her speech somewhat garbled; fair dentition Neck:  no LAD, masses or thyromegaly; no carotid bruits Cardiovascular:  RRR, no m/r/g. No LE edema.  Respiratory:   CTA bilaterally with no wheezes/rales/rhonchi.  Normal respiratory effort. Abdomen:  soft, tender in midepigastric region as well as the B flanks, ND, NABS Back:   normal alignment, + bilateral CVAT Skin:  no rash or induration seen on limited exam Musculoskeletal:  grossly normal tone BUE/BLE, good ROM,  no bony abnormality Lower extremity:  No LE edema.  Limited foot exam with no ulcerations.  2+ distal pulses. Psychiatric:  grossly normal mood and affect, speech fluent and appropriate, AOx3 Neurologic:  CN 2-12 grossly intact, moves all extremities in coordinated fashion, sensation intact    Radiological Exams on Admission: Ct Renal Stone Study  Result Date: 06/12/2017 CLINICAL DATA:  Upper abdominal pain radiates into the flank area for 2 weeks. Hematuria. EXAM: CT ABDOMEN AND PELVIS WITHOUT CONTRAST TECHNIQUE: Multidetector CT imaging of the abdomen and pelvis was performed following the standard protocol without IV contrast. COMPARISON:  02/23/2017.  03/26/2015. FINDINGS: Lower chest: 8 mm cavitary lesion posterior right costophrenic sulcus unchanged since  03/26/2015 compatible with benign etiology. Hepatobiliary: No focal abnormality in the liver on this study without intravenous contrast. Gallbladder is markedly distended measuring 5.6 cm in diameter. No intrahepatic or extrahepatic biliary dilation. Pancreas: No focal mass lesion. No dilatation of the main duct. No intraparenchymal cyst. No peripancreatic edema. Spleen: No splenomegaly. No focal mass lesion. Adrenals/Urinary Tract: No adrenal nodule or mass. Mild fullness of the right intrarenal collecting system is borderline for mild hydronephrosis. There is some layering high attenuation material in a lower pole calyx. Ureter is upper normal diameter throughout. Similar mild fullness noted in the left kidney with layering high attenuation material in the lower pole calyx of this kidney is well. Although left ureter is less distended than on the right, there is a focal area of high density in the distal left ureter (image 74 series 2 and image 48 series 5) that is probably a tiny distal left ureteral stone. The urinary bladder appears normal for the degree of distention.  ) Stomach/Bowel: Stomach is nondistended. No gastric wall thickening. No  evidence of outlet obstruction. Duodenum is normally positioned as is the ligament of Treitz. No small bowel wall thickening. No small bowel dilatation. The terminal ileum is normal. The appendix is normal. Slightly increased attenuation of colonic contents may be trace residua from the upper GI series of 05/18/2017. Vascular/Lymphatic: There is abdominal aortic atherosclerosis without aneurysm. Upper normal lymph nodes are seen in the para-aortic space. No pelvic sidewall lymphadenopathy. Reproductive: The uterus has normal CT imaging appearance. There is no adnexal mass. Other: No intraperitoneal free fluid. Musculoskeletal: Bone windows reveal no worrisome lytic or sclerotic osseous lesions. IMPRESSION: 1. Mild fullness of both intrarenal collecting systems, right slightly more than left with associated mild right ureteral fullness. There is high attenuation material and lower pole collecting systems of each kidney which may represent hemorrhage or infectious debris. These do not appear quite focal enough to represent stones. 2. Tiny focus of high attenuation in the distal left ureter likely a small stone although a focal collection of hemorrhage or debris is possible. 3. Marked gallbladder distention without associated biliary dilatation. Electronically Signed   By: Misty Stanley M.D.   On: 06/12/2017 16:23    EKG: None   Labs on Admission: I have personally reviewed the available labs and imaging studies at the time of the admission.  Pertinent labs:  CO2 16 Glucose 119, 109 BUN 29/Creatinine 1.92/GFR 30; 18/1.32/48 on 06/01/17 Hgb 11.5; 13.5 on 8/2 UA: many bacteria, unable to run additional tests due to TNTC RBC Negative upreg   Assessment/Plan Principal Problem:   Hematuria Active Problems:   Bipolar 1 disorder (HCC)   Abdominal pain   Constipation   Hypothyroidism   PUD (peptic ulcer disease)   Acute renal failure (ARF) (HCC)   Tobacco dependence   Abdominal pain with hematuria  and acute renal failure -Patient with several weeks of abdominal pain and 1 week of gross hematuria -This may date back to as early as 4/5, when a [facility] showed suspicion for pyelonephritis -Hgb has dropped 2 grams in 11 days -Creatinine has risen from 1.3 to 1.92 -This could be an obstructive uropathy from the blood; could be related to dehydration from a UTI; or could be related to intrinsic renal disease -Will observe on Med Surg -Hydration with NS at 150 cc/hr -Continue Rocephin (given 1 dose in the ER and will continue) -Urology consult in AM -CT done today appears to be obscured by hemorrhage and/or  infection -Will repeat CT in 24 hours after flushing the kidneys and treating with abx -Urine culture is pending   PUD and constipation -Patient has been seeing GI since 4/26 -Her constipation is being treated with Linzess (successfully) -She had an EGD on 8/7 with findings of: small hiatal hernia; 1 non-bleeding cratered ulcer; intrinsic severe pyloric stenosis s/p incomplete dilation, stopped due to presence of pyloric channel ulcer -She is also taking Protonix  Bipolar -Continue Tegretol, Seroquel, and Trintellix -Check UDS  Hypothyroidism -Check TSH and free T4; TSH was 19.11 on 5/11 -Continue Synthroid at current dose for now  Tobacco dependence -Encourage cessation.  This was discussed with the patient and should be reviewed on an ongoing basis.   -Patch ordered at patient request.   DVT prophylaxis:  SCDs Code Status:  Full - confirmed with patient/family Family Communication: Sons (21, 65) present throughout evaluation  Disposition Plan:  Home once clinically improved Consults called: Urology  Admission status: It is my clinical opinion that referral for OBSERVATION is reasonable and necessary in this patient based on the above information provided. The aforementioned taken together are felt to place the patient at high risk for further clinical deterioration.  However it is anticipated that the patient may be medically stable for discharge from the hospital within 24 to 48 hours.    Karmen Bongo MD [facility]  If note is complete, please contact covering daytime or nighttime physician. [email or web address] Password [facility]  06/12/2017, 8:04 PM

## 2017-06-12 NOTE — ED Triage Notes (Signed)
Reports of upper abdominal pain that radiates to bilateral flank area x2 weeks. Reports of hematuria since last week. Unknown fevers.

## 2017-06-12 NOTE — Progress Notes (Signed)
Pt c/o 7/10 pain in abd/flank pain. No orders for pain medication. Dr. Olevia Bowens paged and made aware. Waiting for orders.

## 2017-06-12 NOTE — ED Notes (Signed)
Attempted IV in right ac, it infiltrated. Chem 8 ran from small amount of blood that was obtained.

## 2017-06-12 NOTE — Telephone Encounter (Signed)
[place]

## 2017-06-12 NOTE — ED Notes (Signed)
ED Provider at bedside. 

## 2017-06-12 NOTE — Progress Notes (Signed)
Negative H.pylori. No new recommendations from day of procedure.

## 2017-06-12 NOTE — Telephone Encounter (Signed)
She is currently there at ED.

## 2017-06-13 ENCOUNTER — Observation Stay (HOSPITAL_COMMUNITY): Payer: BLUE CROSS/BLUE SHIELD

## 2017-06-13 DIAGNOSIS — N179 Acute kidney failure, unspecified: Secondary | ICD-10-CM

## 2017-06-13 DIAGNOSIS — R310 Gross hematuria: Secondary | ICD-10-CM

## 2017-06-13 DIAGNOSIS — R319 Hematuria, unspecified: Secondary | ICD-10-CM | POA: Diagnosis not present

## 2017-06-13 DIAGNOSIS — R1084 Generalized abdominal pain: Secondary | ICD-10-CM | POA: Diagnosis not present

## 2017-06-13 DIAGNOSIS — K279 Peptic ulcer, site unspecified, unspecified as acute or chronic, without hemorrhage or perforation: Secondary | ICD-10-CM | POA: Diagnosis not present

## 2017-06-13 DIAGNOSIS — F319 Bipolar disorder, unspecified: Secondary | ICD-10-CM | POA: Diagnosis not present

## 2017-06-13 DIAGNOSIS — R309 Painful micturition, unspecified: Secondary | ICD-10-CM

## 2017-06-13 DIAGNOSIS — R3914 Feeling of incomplete bladder emptying: Secondary | ICD-10-CM

## 2017-06-13 LAB — CBC
HEMATOCRIT: 31.5 % — AB (ref 36.0–46.0)
Hemoglobin: 10.4 g/dL — ABNORMAL LOW (ref 12.0–15.0)
MCH: 27.3 pg (ref 26.0–34.0)
MCHC: 33 g/dL (ref 30.0–36.0)
MCV: 82.7 fL (ref 78.0–100.0)
PLATELETS: 332 10*3/uL (ref 150–400)
RBC: 3.81 MIL/uL — AB (ref 3.87–5.11)
RDW: 18.5 % — AB (ref 11.5–15.5)
WBC: 7.1 10*3/uL (ref 4.0–10.5)

## 2017-06-13 LAB — BASIC METABOLIC PANEL
Anion gap: 9 (ref 5–15)
BUN: 26 mg/dL — ABNORMAL HIGH (ref 6–20)
CHLORIDE: 111 mmol/L (ref 101–111)
CO2: 18 mmol/L — AB (ref 22–32)
CREATININE: 1.73 mg/dL — AB (ref 0.44–1.00)
Calcium: 7.8 mg/dL — ABNORMAL LOW (ref 8.9–10.3)
GFR calc Af Amer: 40 mL/min — ABNORMAL LOW (ref >60)
GFR calc non Af Amer: 34 mL/min — ABNORMAL LOW (ref >60)
Glucose, Bld: 92 mg/dL (ref 65–99)
Potassium: 3.5 mmol/L (ref 3.5–5.1)
Sodium: 138 mmol/L (ref 135–145)

## 2017-06-13 LAB — T4, FREE: Free T4: 0.6 ng/dL — ABNORMAL LOW (ref 0.61–1.12)

## 2017-06-13 LAB — TSH: TSH: 0.144 u[IU]/mL — ABNORMAL LOW (ref 0.350–4.500)

## 2017-06-13 MED ORDER — LEVOTHYROXINE SODIUM 50 MCG PO TABS
50.0000 ug | ORAL_TABLET | ORAL | Status: DC
  Administered 2017-06-14: 50 ug via ORAL
  Filled 2017-06-13: qty 1

## 2017-06-13 MED ORDER — MORPHINE SULFATE (PF) 2 MG/ML IV SOLN
2.0000 mg | INTRAVENOUS | Status: DC | PRN
  Administered 2017-06-13: 2 mg via INTRAVENOUS
  Filled 2017-06-13: qty 1

## 2017-06-13 MED ORDER — OXYCODONE-ACETAMINOPHEN 5-325 MG PO TABS
1.0000 | ORAL_TABLET | ORAL | Status: DC | PRN
  Administered 2017-06-13 – 2017-06-14 (×4): 2 via ORAL
  Filled 2017-06-13 (×5): qty 2

## 2017-06-13 MED ORDER — MORPHINE SULFATE (PF) 2 MG/ML IV SOLN
1.0000 mg | Freq: Once | INTRAVENOUS | Status: AC
  Administered 2017-06-13: 1 mg via INTRAVENOUS
  Filled 2017-06-13: qty 1

## 2017-06-13 MED ORDER — CARBAMAZEPINE ER 200 MG PO TB12
600.0000 mg | ORAL_TABLET | Freq: Two times a day (BID) | ORAL | Status: DC
  Filled 2017-06-13 (×3): qty 3

## 2017-06-13 MED ORDER — CARBAMAZEPINE ER 400 MG PO TB12
600.0000 mg | ORAL_TABLET | Freq: Two times a day (BID) | ORAL | Status: DC
  Administered 2017-06-13 – 2017-06-14 (×3): 600 mg via ORAL
  Filled 2017-06-13 (×7): qty 2

## 2017-06-13 NOTE — Plan of Care (Signed)
Problem: Pain Managment: Goal: General experience of comfort will improve Outcome: Progressing Pt c/o R flank pain, given Dilaudid 1mg  x1 dose per MD order. Pt educated on pain mgmt as well as medications available. Pt verbalized understanding. Will continue to monitor pt

## 2017-06-13 NOTE — Progress Notes (Signed)
PROGRESS NOTE    Deanna Berry  VZD:[id number] DOB: April 02, 1971 DOA: 06/12/2017 PCP: Redmond School, MD    Brief Narrative:  46 year old female admitted to the hospital with abdominal/flank pain for several weeks now. She also developed hematuria over the past week. CT scan indicated hemorrhage/debris in renal collecting system. Urology consulted. She is on antibiotics for possible UTI.   Assessment & Plan:   Principal Problem:   Hematuria Active Problems:   Bipolar 1 disorder (HCC)   Abdominal pain   Constipation   Hypothyroidism   PUD (peptic ulcer disease)   Acute renal failure (ARF) (HCC)   Tobacco dependence   1. Abdominal pain with hematuria. CT scan of the abdomen and pelvis indicated fullness in renal collecting system bilaterally with possible debris/hemorrhage and ureters. Urology has been consulted. She has a repeat CT scan pending today. Etiology of her abdominal pain may be related to urinary tract findings. CT scan also indicated distended gallbladder. Will check abdominal ultrasound. Add LFTs to morning labs. She has had this pain for several weeks now. 2. Possible urinary tract infection. Hematuria may be related to possible infection. She is on Rocephin. Follow-up urine culture. 3. Acute kidney injury. Possibly related to dehydration versus postobstructive uropathy. Improving with IV hydration. Continue to follow labs in a.m . 4. Peptic ulcer disease/constipation. Followed by GI. Recently had EGD on 8/7 that showed one nonbleeding cratered ulcer, intrinsic severe pyloric stenosis status post incomplete dilatation, stop due to presence of pyloric channel ulcer. She is on Protonix. She also receives Lantus for constipation. 5. Bipolar disorder. Continue Tegretol, Seroquel, Trintellix 6. Hypothyroidism. TSH is low. Decrease Synthroid from 75 mcg to 50 mcg. Repeat thyroid studies in 4 weeks   DVT prophylaxis: SCDs Code Status: Full code Family Communication: No family  present Disposition Plan: Discharge home once improved   Consultants:   Urology  Procedures:     Antimicrobials:   Rocephin 8/14>>   Subjective: Continues to have significant pain in her flanks and abdomen. She reports hematuria has improved.  Objective: Vitals:   06/12/17 1900 06/12/17 2217 06/13/17 0452 06/13/17 1300  BP: 124/82 (!) 136/91 120/82 108/87  Pulse: 89 90 83 (!) 59  Resp: 18 16 18 18   Temp: (!) 97.5 F (36.4 C) 98.6 F (37 C) (!) 97.5 F (36.4 C) 98.7 F (37.1 C)  TempSrc: Oral Oral Oral Oral  SpO2: 100% 98% 98% 94%  Weight:      Height:        Intake/Output Summary (Last 24 hours) at 06/13/17 1554 Last data filed at 06/13/17 1532  Gross per 24 hour  Intake           1812.5 ml  Output             2100 ml  Net           -287.5 ml   Filed Weights   06/12/17 1223  Weight: 47.6 kg (105 lb)    Examination:  General exam: Appears calm and comfortable  Respiratory system: Clear to auscultation. Respiratory effort normal. Cardiovascular system: S1 & S2 heard, RRR. No JVD, murmurs, rubs, gallops or clicks. No pedal edema. Gastrointestinal system: Abdomen is nondistended, soft and tender in epigastrium and right upper quadrant. She also has bilateral flank tenderness.  No organomegaly or masses felt. Normal bowel sounds heard. Central nervous system: Alert and oriented. No focal neurological deficits. Extremities: Symmetric 5 x 5 power. Skin: No rashes, lesions or ulcers Psychiatry: Judgement and insight appear  normal. Mood & affect appropriate.     Data Reviewed: I have personally reviewed following labs and imaging studies  CBC:  Recent Labs Lab 06/12/17 1319 06/12/17 1452 06/13/17 0553  WBC  --  8.8 7.1  NEUTROABS  --  6.5  --   HGB 11.9* 11.5* 10.4*  HCT 35.0* 34.5* 31.5*  MCV  --  82.3 82.7  PLT  --  382 086   Basic Metabolic Panel:  Recent Labs Lab 06/12/17 1319 06/12/17 1452 06/13/17 0553  NA 135 136 138  K 4.7 4.2 3.5    CL 114* 107 111  CO2  --  16* 18*  GLUCOSE 119* 109* 92  BUN 37* 29* 26*  CREATININE 2.10* 1.93* 1.73*  CALCIUM  --  8.9 7.8*   GFR: Estimated Creatinine Clearance: 29.2 mL/min (A) (by C-G formula based on SCr of 1.73 mg/dL (H)). Liver Function Tests: No results for input(s): AST, ALT, ALKPHOS, BILITOT, PROT, ALBUMIN in the last 168 hours. No results for input(s): LIPASE, AMYLASE in the last 168 hours. No results for input(s): AMMONIA in the last 168 hours. Coagulation Profile: No results for input(s): INR, PROTIME in the last 168 hours. Cardiac Enzymes: No results for input(s): CKTOTAL, CKMB, CKMBINDEX, TROPONINI in the last 168 hours. BNP (last 3 results) No results for input(s): PROBNP in the last 8760 hours. HbA1C: No results for input(s): HGBA1C in the last 72 hours. CBG: No results for input(s): GLUCAP in the last 168 hours. Lipid Profile: No results for input(s): CHOL, HDL, LDLCALC, TRIG, CHOLHDL, LDLDIRECT in the last 72 hours. Thyroid Function Tests:  Recent Labs  06/13/17 0553  TSH 0.144*  FREET4 0.60*   Anemia Panel: No results for input(s): VITAMINB12, FOLATE, FERRITIN, TIBC, IRON, RETICCTPCT in the last 72 hours. Sepsis Labs: No results for input(s): PROCALCITON, LATICACIDVEN in the last 168 hours.  No results found for this or any previous visit (from the past 240 hour(s)).       Radiology Studies: Ct Renal Stone Study  Result Date: 06/12/2017 CLINICAL DATA:  Upper abdominal pain radiates into the flank area for 2 weeks. Hematuria. EXAM: CT ABDOMEN AND PELVIS WITHOUT CONTRAST TECHNIQUE: Multidetector CT imaging of the abdomen and pelvis was performed following the standard protocol without IV contrast. COMPARISON:  02/23/2017.  03/26/2015. FINDINGS: Lower chest: 8 mm cavitary lesion posterior right costophrenic sulcus unchanged since 03/26/2015 compatible with benign etiology. Hepatobiliary: No focal abnormality in the liver on this study without  intravenous contrast. Gallbladder is markedly distended measuring 5.6 cm in diameter. No intrahepatic or extrahepatic biliary dilation. Pancreas: No focal mass lesion. No dilatation of the main duct. No intraparenchymal cyst. No peripancreatic edema. Spleen: No splenomegaly. No focal mass lesion. Adrenals/Urinary Tract: No adrenal nodule or mass. Mild fullness of the right intrarenal collecting system is borderline for mild hydronephrosis. There is some layering high attenuation material in a lower pole calyx. Ureter is upper normal diameter throughout. Similar mild fullness noted in the left kidney with layering high attenuation material in the lower pole calyx of this kidney is well. Although left ureter is less distended than on the right, there is a focal area of high density in the distal left ureter (image 74 series 2 and image 48 series 5) that is probably a tiny distal left ureteral stone. The urinary bladder appears normal for the degree of distention.  ) Stomach/Bowel: Stomach is nondistended. No gastric wall thickening. No evidence of outlet obstruction. Duodenum is normally positioned as is the  ligament of Treitz. No small bowel wall thickening. No small bowel dilatation. The terminal ileum is normal. The appendix is normal. Slightly increased attenuation of colonic contents may be trace residua from the upper GI series of 05/18/2017. Vascular/Lymphatic: There is abdominal aortic atherosclerosis without aneurysm. Upper normal lymph nodes are seen in the para-aortic space. No pelvic sidewall lymphadenopathy. Reproductive: The uterus has normal CT imaging appearance. There is no adnexal mass. Other: No intraperitoneal free fluid. Musculoskeletal: Bone windows reveal no worrisome lytic or sclerotic osseous lesions. IMPRESSION: 1. Mild fullness of both intrarenal collecting systems, right slightly more than left with associated mild right ureteral fullness. There is high attenuation material and lower pole  collecting systems of each kidney which may represent hemorrhage or infectious debris. These do not appear quite focal enough to represent stones. 2. Tiny focus of high attenuation in the distal left ureter likely a small stone although a focal collection of hemorrhage or debris is possible. 3. Marked gallbladder distention without associated biliary dilatation. Electronically Signed   By: Misty Stanley M.D.   On: 06/12/2017 16:23        Scheduled Meds: . carbamazepine  600 mg Oral BID  . docusate sodium  100 mg Oral BID  . gabapentin  800 mg Oral TID  . levothyroxine  75 mcg Oral QAC breakfast  . linaclotide  290 mcg Oral QAC breakfast  . nicotine  14 mg Transdermal Daily  . pantoprazole  40 mg Oral BID AC  . QUEtiapine  300 mg Oral QHS  . vortioxetine HBr  20 mg Oral Daily   Continuous Infusions: . sodium chloride 150 mL/hr at 06/13/17 1237  . cefTRIAXone (ROCEPHIN)  IV 1 g (06/13/17 1532)     LOS: 0 days    Time spent: 60mins    Taraya Steward, MD [facility] Pager [phone]  If 7PM-7AM, please contact night-coverage [email or web address] Password [facility] 06/13/2017, 3:54 PM

## 2017-06-13 NOTE — Consult Note (Signed)
Urology Consult  Referring physician: Dr. Roderic Palau Reason for referral: gross hematuria  Chief Complaint: suprapubic pain  History of Present Illness: Deanna Berry is a 46yo with a hx of bipolar disorder, substance abuse, and constipation admitted with gross hematuria and UTI. For 1 week she noted worsening gross hematuria with clots. She had associated frequency, urgency, and dysuria. She does not have a hx of recurrent UTIs. She was treated with rocephin the ER yesterday. Her suprapubic pain has improved today. Her urine is currently light pink. She underwent a noncontrast CT which showed bilateral clot in her collecting system and left distal ureteral hyperdensity which could be a small stone versus clot. No other calculi noted. Urine microscopy showed many bacteria. Urine culture is pending  Past Medical History:  Diagnosis Date  . Anemia   . Anxiety   . Bipolar disorder ([place])   . Cleft palate   . Constipation   . Depression   . Headache(784.0)   . Hypothyroidism   . PUD (peptic ulcer disease)   . Sinus congestion   . Substance abuse    Past Surgical History:  Procedure Laterality Date  . CESAREAN SECTION    . CLEFT PALATE REPAIR  2013  . COLONOSCOPY  2016   Dr. Britta Mccreedy: normal   . ESOPHAGOGASTRODUODENOSCOPY  03/2015   Dr. Britta Mccreedy: esophagitis, 2 medium-sized ulcers in antrum and pylorus, large ulcer in duodenal bulb, hiatal hernia, no specimens collected.   . ESOPHAGOGASTRODUODENOSCOPY  06/2015   Dr. Britta Mccreedy: small pyloric channel ulcer with mild pyloric stenosis, previous gastric and duodenal ulcers completely healed   . ESOPHAGOGASTRODUODENOSCOPY (EGD) WITH PROPOFOL N/A 03/24/2017   [facility]: Z-line regular, small hiatal hernia, non-bleeding large clean-based pyloric channel ulcer with narrowing of the pyloric channel, unable to advance scope into dudoenum, chronic gastritis  . HERNIA REPAIR    . MULTIPLE TOOTH EXTRACTIONS      Medications: I have reviewed the patient's current  medications. Allergies:  Allergies  Allergen Reactions  . Amoxil [Amoxicillin] Hives    Has patient had a PCN reaction causing immediate rash, facial/tongue/throat swelling, SOB or lightheadedness with hypotension: No Has patient had a PCN reaction causing severe rash involving mucus membranes or skin necrosis: No Has patient had a PCN reaction that required hospitalization: No Has patient had a PCN reaction occurring within the last 10 years: No If all of the above answers are "NO", then may proceed with Cephalosporin use.   . Adhesive [Tape] Rash  . Neosporin Original [Bacitracin-Neomycin-Polymyxin] Rash    Family History  Problem Relation Age of Onset  . Colon cancer Mother 50   Social History:  reports that she has been smoking Cigarettes.  She has a 31.00 pack-year smoking history. She has never used smokeless tobacco. She reports that she does not drink alcohol or use drugs.  Review of Systems  Constitutional: Positive for malaise/fatigue.  Gastrointestinal: Positive for nausea.  Genitourinary: Positive for frequency, hematuria and urgency.  Musculoskeletal: Positive for back pain.  All other systems reviewed and are negative.   Physical Exam:  Vital signs in last 24 hours: Temp:  [97.5 F (36.4 C)-98.7 F (37.1 C)] 98.7 F (37.1 C) (08/14 1300) Pulse Rate:  [59-92] 59 (08/14 1300) Resp:  [16-18] 18 (08/14 1300) BP: (104-136)/(82-91) 108/87 (08/14 1300) SpO2:  [94 %-100 %] 94 % (08/14 1300) Physical Exam  Laboratory Data:  Results for orders placed or performed during the hospital encounter of 06/12/17 (from the past 72 hour(s))  Urinalysis, Routine w  reflex microscopic     Status: Abnormal   Collection Time: 06/12/17 12:30 PM  Result Value Ref Range   Color, Urine RED (A) YELLOW    Comment: BIOCHEMICALS MAY BE AFFECTED BY COLOR   APPearance CLOUDY (A) CLEAR   Specific Gravity, Urine  1.005 - 1.030    TEST NOT REPORTED DUE TO COLOR INTERFERENCE OF URINE PIGMENT    pH  5.0 - 8.0    TEST NOT REPORTED DUE TO COLOR INTERFERENCE OF URINE PIGMENT   Glucose, UA (A) NEGATIVE mg/dL    TEST NOT REPORTED DUE TO COLOR INTERFERENCE OF URINE PIGMENT   Hgb urine dipstick (A) NEGATIVE    TEST NOT REPORTED DUE TO COLOR INTERFERENCE OF URINE PIGMENT   Bilirubin Urine (A) NEGATIVE    TEST NOT REPORTED DUE TO COLOR INTERFERENCE OF URINE PIGMENT   Ketones, ur (A) NEGATIVE mg/dL    TEST NOT REPORTED DUE TO COLOR INTERFERENCE OF URINE PIGMENT   Protein, ur (A) NEGATIVE mg/dL    TEST NOT REPORTED DUE TO COLOR INTERFERENCE OF URINE PIGMENT   Nitrite (A) NEGATIVE    TEST NOT REPORTED DUE TO COLOR INTERFERENCE OF URINE PIGMENT   Leukocytes, UA (A) NEGATIVE    TEST NOT REPORTED DUE TO COLOR INTERFERENCE OF URINE PIGMENT  Urinalysis, Microscopic (reflex)     Status: Abnormal   Collection Time: 06/12/17 12:30 PM  Result Value Ref Range   RBC / HPF TOO NUMEROUS TO COUNT 0 - 5 RBC/hpf   WBC, UA 0-5 0 - 5 WBC/hpf   Bacteria, UA MANY (A) NONE SEEN   Squamous Epithelial / LPF NONE SEEN NONE SEEN  Pregnancy, urine     Status: None   Collection Time: 06/12/17 12:30 PM  Result Value Ref Range   Preg Test, Ur NEGATIVE NEGATIVE  I-Stat Chem 8, ED     Status: Abnormal   Collection Time: 06/12/17  1:19 PM  Result Value Ref Range   Sodium 135 135 - 145 mmol/L   Potassium 4.7 3.5 - 5.1 mmol/L   Chloride 114 (H) 101 - 111 mmol/L   BUN 37 (H) 6 - 20 mg/dL   Creatinine, Ser 2.10 (H) 0.44 - 1.00 mg/dL   Glucose, Bld 119 (H) 65 - 99 mg/dL   Calcium, Ion 0.96 (L) 1.15 - 1.40 mmol/L   TCO2 16 0 - 100 mmol/L   Hemoglobin 11.9 (L) 12.0 - 15.0 g/dL   HCT 35.0 (L) 36.0 - 46.0 %  CBC with Differential     Status: Abnormal   Collection Time: 06/12/17  2:52 PM  Result Value Ref Range   WBC 8.8 4.0 - 10.5 K/uL   RBC 4.19 3.87 - 5.11 MIL/uL   Hemoglobin 11.5 (L) 12.0 - 15.0 g/dL   HCT 34.5 (L) 36.0 - 46.0 %   MCV 82.3 78.0 - 100.0 fL   MCH 27.4 26.0 - 34.0 pg   MCHC 33.3 30.0 - 36.0  g/dL   RDW 18.3 (H) 11.5 - 15.5 %   Platelets 382 150 - 400 K/uL   Neutrophils Relative % 73 %   Neutro Abs 6.5 1.7 - 7.7 K/uL   Lymphocytes Relative 16 %   Lymphs Abs 1.4 0.7 - 4.0 K/uL   Monocytes Relative 7 %   Monocytes Absolute 0.6 0.1 - 1.0 K/uL   Eosinophils Relative 3 %   Eosinophils Absolute 0.3 0.0 - 0.7 K/uL   Basophils Relative 1 %   Basophils Absolute 0.1 0.0 - 0.1  K/uL  Basic metabolic panel     Status: Abnormal   Collection Time: 06/12/17  2:52 PM  Result Value Ref Range   Sodium 136 135 - 145 mmol/L   Potassium 4.2 3.5 - 5.1 mmol/L   Chloride 107 101 - 111 mmol/L   CO2 16 (L) 22 - 32 mmol/L   Glucose, Bld 109 (H) 65 - 99 mg/dL   BUN 29 (H) 6 - 20 mg/dL   Creatinine, Ser 1.93 (H) 0.44 - 1.00 mg/dL   Calcium 8.9 8.9 - 10.3 mg/dL   GFR calc non Af Amer 30 (L) >60 mL/min   GFR calc Af Amer 35 (L) >60 mL/min    Comment: (NOTE) The eGFR has been calculated using the CKD EPI equation. This calculation has not been validated in all clinical situations. eGFR's persistently <60 mL/min signify possible Chronic Kidney Disease.    Anion gap 13 5 - 15  Urine rapid drug screen (hosp performed)     Status: Abnormal   Collection Time: 06/12/17  8:30 PM  Result Value Ref Range   Opiates NONE DETECTED NONE DETECTED   Cocaine NONE DETECTED NONE DETECTED   Benzodiazepines POSITIVE (A) NONE DETECTED   Amphetamines NONE DETECTED NONE DETECTED   Tetrahydrocannabinol NONE DETECTED NONE DETECTED   Barbiturates NONE DETECTED NONE DETECTED    Comment:        DRUG SCREEN FOR MEDICAL PURPOSES ONLY.  IF CONFIRMATION IS NEEDED FOR ANY PURPOSE, NOTIFY LAB WITHIN 5 DAYS.        LOWEST DETECTABLE LIMITS FOR URINE DRUG SCREEN Drug Class       Cutoff (ng/mL) Amphetamine      1000 Barbiturate      200 Benzodiazepine   759 Tricyclics       163 Opiates          300 Cocaine          300 THC              50   Basic metabolic panel     Status: Abnormal   Collection Time: 06/13/17   5:53 AM  Result Value Ref Range   Sodium 138 135 - 145 mmol/L   Potassium 3.5 3.5 - 5.1 mmol/L   Chloride 111 101 - 111 mmol/L   CO2 18 (L) 22 - 32 mmol/L   Glucose, Bld 92 65 - 99 mg/dL   BUN 26 (H) 6 - 20 mg/dL   Creatinine, Ser 1.73 (H) 0.44 - 1.00 mg/dL   Calcium 7.8 (L) 8.9 - 10.3 mg/dL   GFR calc non Af Amer 34 (L) >60 mL/min   GFR calc Af Amer 40 (L) >60 mL/min    Comment: (NOTE) The eGFR has been calculated using the CKD EPI equation. This calculation has not been validated in all clinical situations. eGFR's persistently <60 mL/min signify possible Chronic Kidney Disease.    Anion gap 9 5 - 15  CBC     Status: Abnormal   Collection Time: 06/13/17  5:53 AM  Result Value Ref Range   WBC 7.1 4.0 - 10.5 K/uL   RBC 3.81 (L) 3.87 - 5.11 MIL/uL   Hemoglobin 10.4 (L) 12.0 - 15.0 g/dL   HCT 31.5 (L) 36.0 - 46.0 %   MCV 82.7 78.0 - 100.0 fL   MCH 27.3 26.0 - 34.0 pg   MCHC 33.0 30.0 - 36.0 g/dL   RDW 18.5 (H) 11.5 - 15.5 %   Platelets 332 150 - 400 K/uL  TSH     Status: Abnormal   Collection Time: 06/13/17  5:53 AM  Result Value Ref Range   TSH 0.144 (L) 0.350 - 4.500 uIU/mL    Comment: Performed by a 3rd Generation assay with a functional sensitivity of <=0.01 uIU/mL.  T4, free     Status: Abnormal   Collection Time: 06/13/17  5:53 AM  Result Value Ref Range   Free T4 0.60 (L) 0.61 - 1.12 ng/dL    Comment: (NOTE) Biotin ingestion may interfere with free T4 tests. If the results are inconsistent with the TSH level, previous test results, or the clinical presentation, then consider biotin interference. If needed, order repeat testing after stopping biotin. Performed at [facility], [place] [address], [place], [place] [address]    No results found for this or any previous visit (from the past 240 hour(s)). Creatinine:  Recent Labs  06/12/17 1319 06/12/17 1452 06/13/17 0553  CREATININE 2.10* 1.93* 1.73*   Baseline Creatinine:  1.3  Impression/Assessment:   46yo with gross hematuria and UTI   Plan:  1. UTI: Please continue rocephin pending urine culture and then discharge the patient on 14 days of culture specific antibiotics 2. Gross hematuria: Likely related to UTI, however I will have her followup in my office in 3-4 weeks after discharge to assess for resolution of the hematuria. Please call with any additional questions  Nicolette Bang 06/13/2017, 4:11 PM

## 2017-06-13 NOTE — Progress Notes (Signed)
Pharmacy Antibiotic Note  Deanna Berry is a 46 y.o. female admitted on 06/12/2017 with UTI.  Pharmacy has been consulted for ROCEPHIN dosing.  Plan: Rocephin 1gm IV q24hrs Monitor labs, progress, c/s  Height: 5' (152.4 cm) Weight: 105 lb (47.6 kg) IBW/kg (Calculated) : 45.5  Temp (24hrs), Avg:97.9 F (36.6 C), Min:97.5 F (36.4 C), Max:98.6 F (37 C)   Recent Labs Lab 06/12/17 1319 06/12/17 1452 06/13/17 0553  WBC  --  8.8 7.1  CREATININE 2.10* 1.93* 1.73*    Estimated Creatinine Clearance: 29.2 mL/min (A) (by C-G formula based on SCr of 1.73 mg/dL (H)).    Allergies  Allergen Reactions  . Amoxil [Amoxicillin] Hives    Has patient had a PCN reaction causing immediate rash, facial/tongue/throat swelling, SOB or lightheadedness with hypotension: No Has patient had a PCN reaction causing severe rash involving mucus membranes or skin necrosis: No Has patient had a PCN reaction that required hospitalization: No Has patient had a PCN reaction occurring within the last 10 years: No If all of the above answers are "NO", then may proceed with Cephalosporin use.   . Adhesive [Tape] Rash  . Neosporin Original [Bacitracin-Neomycin-Polymyxin] Rash   Antimicrobials this admission: Rocephin 8/13 >>   Dose adjustments this admission:  Microbiology results:  BCx: pending  UCx: pending   Sputum:    MRSA PCR:   Thank you for allowing pharmacy to be a part of this patient's care.  Hart Robinsons A 06/13/2017 11:01 AM

## 2017-06-13 NOTE — Progress Notes (Signed)
Letter mailed to pt to inform of negative H pylori.

## 2017-06-14 ENCOUNTER — Observation Stay (HOSPITAL_COMMUNITY): Payer: BLUE CROSS/BLUE SHIELD

## 2017-06-14 ENCOUNTER — Telehealth: Payer: Self-pay | Admitting: Gastroenterology

## 2017-06-14 DIAGNOSIS — N026 Recurrent and persistent hematuria with dense deposit disease: Secondary | ICD-10-CM | POA: Diagnosis not present

## 2017-06-14 DIAGNOSIS — R319 Hematuria, unspecified: Secondary | ICD-10-CM | POA: Diagnosis not present

## 2017-06-14 DIAGNOSIS — N172 Acute kidney failure with medullary necrosis: Secondary | ICD-10-CM | POA: Diagnosis not present

## 2017-06-14 DIAGNOSIS — R109 Unspecified abdominal pain: Secondary | ICD-10-CM | POA: Diagnosis not present

## 2017-06-14 LAB — CBC
HCT: 28.1 % — ABNORMAL LOW (ref 36.0–46.0)
Hemoglobin: 9.1 g/dL — ABNORMAL LOW (ref 12.0–15.0)
MCH: 27.2 pg (ref 26.0–34.0)
MCHC: 32.4 g/dL (ref 30.0–36.0)
MCV: 83.9 fL (ref 78.0–100.0)
PLATELETS: 295 10*3/uL (ref 150–400)
RBC: 3.35 MIL/uL — ABNORMAL LOW (ref 3.87–5.11)
RDW: 18.4 % — AB (ref 11.5–15.5)
WBC: 6.5 10*3/uL (ref 4.0–10.5)

## 2017-06-14 LAB — COMPREHENSIVE METABOLIC PANEL
ALT: 18 U/L (ref 14–54)
AST: 17 U/L (ref 15–41)
Albumin: 1.9 g/dL — ABNORMAL LOW (ref 3.5–5.0)
Alkaline Phosphatase: 45 U/L (ref 38–126)
Anion gap: 5 (ref 5–15)
BILIRUBIN TOTAL: 0.6 mg/dL (ref 0.3–1.2)
BUN: 14 mg/dL (ref 6–20)
CO2: 20 mmol/L — ABNORMAL LOW (ref 22–32)
CREATININE: 1.07 mg/dL — AB (ref 0.44–1.00)
Calcium: 7.5 mg/dL — ABNORMAL LOW (ref 8.9–10.3)
Chloride: 119 mmol/L — ABNORMAL HIGH (ref 101–111)
Glucose, Bld: 84 mg/dL (ref 65–99)
Potassium: 3.5 mmol/L (ref 3.5–5.1)
Sodium: 144 mmol/L (ref 135–145)
TOTAL PROTEIN: 4.3 g/dL — AB (ref 6.5–8.1)

## 2017-06-14 LAB — URINE CULTURE: CULTURE: NO GROWTH

## 2017-06-14 MED ORDER — TAMSULOSIN HCL 0.4 MG PO CAPS: 0.4000 mg | ORAL_CAPSULE | Freq: Every day | ORAL | 1 refills | Status: DC

## 2017-06-14 MED ORDER — DOCUSATE SODIUM 100 MG PO CAPS: 100.0000 mg | ORAL_CAPSULE | Freq: Two times a day (BID) | ORAL | 0 refills | Status: DC

## 2017-06-14 MED ORDER — CEFPODOXIME PROXETIL 200 MG PO TABS: 200.0000 mg | ORAL_TABLET | Freq: Two times a day (BID) | ORAL | 0 refills | Status: AC

## 2017-06-14 MED ORDER — NICOTINE 14 MG/24HR TD PT24: 14.0000 mg | MEDICATED_PATCH | Freq: Every day | TRANSDERMAL | 0 refills | Status: DC

## 2017-06-14 NOTE — Progress Notes (Signed)
Discharge instructions gone over with patient, verbalized understanding. IV removed, patient tolerated procedure well. 

## 2017-06-14 NOTE — Telephone Encounter (Signed)
appt made and reminder in epic

## 2017-06-14 NOTE — Discharge Summary (Signed)
Physician Discharge Summary  Deanna Berry MRN: [id number] DOB/AGE: [birth year] 46 y.o.  PCP: Redmond School, MD   Admit date: 06/12/2017 Discharge date: 06/14/2017  Discharge Diagnoses:    Principal Problem:   Hematuria Active Problems:   Bipolar 1 disorder (HCC)   Abdominal pain   Constipation   Hypothyroidism   PUD (peptic ulcer disease)   Acute renal failure (ARF) (HCC)   Tobacco dependence    Follow-up recommendations Follow-up with PCP in 3-5 days , including all  additional recommended appointments as below Follow-up CBC, CMP in 3-5 days Follow up with urology Dr.Patrick McKenzie  in 3-4 weeks, to ensure resolution of hematuria, Patient strongly advised by GI to avoid aspirin and aspirin related products Repeat thyroid studies in 4 weeks     Current Discharge Medication List    START taking these medications   Details  cefpodoxime (VANTIN) 200 MG tablet Take 1 tablet (200 mg total) by mouth 2 (two) times daily. Qty: 10 tablet, Refills: 0    docusate sodium (COLACE) 100 MG capsule Take 1 capsule (100 mg total) by mouth 2 (two) times daily. Qty: 10 capsule, Refills: 0    nicotine (NICODERM CQ - DOSED IN MG/24 HOURS) 14 mg/24hr patch Place 1 patch (14 mg total) onto the skin daily. Qty: 28 patch, Refills: 0    tamsulosin (FLOMAX) 0.4 MG CAPS capsule Take 1 capsule (0.4 mg total) by mouth daily after breakfast. Qty: 30 capsule, Refills: 1      CONTINUE these medications which have NOT CHANGED   Details  !! carbamazepine (TEGRETOL XR) 200 MG 12 hr tablet Take 200 mg by mouth 2 (two) times daily.  Refills: 0    !! carbamazepine (TEGRETOL XR) 400 MG 12 hr tablet Take 400 mg by mouth 2 (two) times daily. Refills: 0    gabapentin (NEURONTIN) 800 MG tablet Take 800 mg by mouth 3 (three) times daily. Refills: 0    levothyroxine (SYNTHROID, LEVOTHROID) 75 MCG tablet Take 75 mcg by mouth daily before breakfast.  Refills: 0    linaclotide (LINZESS) 290 MCG CAPS  capsule Take 1 capsule (290 mcg total) by mouth daily before breakfast. Qty: 90 capsule, Refills: 3    pantoprazole (PROTONIX) 40 MG tablet Take 1 tablet (40 mg total) by mouth 2 (two) times daily before a meal. Qty: 60 tablet, Refills: 3    QUEtiapine (SEROQUEL) 300 MG tablet Take 300 mg by mouth at bedtime.    TRINTELLIX 20 MG TABS Take 20 mg by mouth daily. Refills: 0     !! - Potential duplicate medications found. Please discuss with provider.    STOP taking these medications     lidocaine (XYLOCAINE) 2 % solution          Discharge Condition: Stable   Discharge Instructions Get Medicines reviewed and adjusted: Please take all your medications with you for your next visit with your Primary MD  Please request your Primary MD to go over all hospital tests and procedure/radiological results at the follow up, please ask your Primary MD to get all Hospital records sent to his/her office.  If you experience worsening of your admission symptoms, develop shortness of breath, life threatening emergency, suicidal or homicidal thoughts you must seek medical attention immediately by calling 911 or calling your MD immediately if symptoms less severe.  You must read complete instructions/literature along with all the possible adverse reactions/side effects for all the Medicines you take and that have been prescribed to you. Take any  new Medicines after you have completely understood and accpet all the possible adverse reactions/side effects.   Do not drive when taking Pain medications.   Do not take more than prescribed Pain, Sleep and Anxiety Medications  Special Instructions: If you have smoked or chewed Tobacco in the last 2 yrs please stop smoking, stop any regular Alcohol and or any Recreational drug use.  Wear Seat belts while driving.  Please note  You were cared for by a hospitalist during your hospital stay. Once you are discharged, your primary care physician will  handle any further medical issues. Please note that NO REFILLS for any discharge medications will be authorized once you are discharged, as it is imperative that you return to your primary care physician (or establish a relationship with a primary care physician if you do not have one) for your aftercare needs so that they can reassess your need for medications and monitor your lab values.     Allergies  Allergen Reactions  . Amoxil [Amoxicillin] Hives    Has patient had a PCN reaction causing immediate rash, facial/tongue/throat swelling, SOB or lightheadedness with hypotension: No Has patient had a PCN reaction causing severe rash involving mucus membranes or skin necrosis: No Has patient had a PCN reaction that required hospitalization: No Has patient had a PCN reaction occurring within the last 10 years: No If all of the above answers are "NO", then may proceed with Cephalosporin use.   . Adhesive [Tape] Rash  . Neosporin Original [Bacitracin-Neomycin-Polymyxin] Rash      Disposition: 01-Home or Self Care   Consults:   urology    Significant Diagnostic Studies:  Dg Ugi W/high Density W/kub  Result Date: 05/18/2017 CLINICAL DATA:  Weight loss, pyloric channel ulcer diagnosed in May 2018, peptic ulcer disease EXAM: UPPER GI SERIES WITH KUB TECHNIQUE: After obtaining a scout radiograph a routine upper GI series was performed using thin and high density barium. FLUOROSCOPY TIME:  Fluoroscopy Time:  1 minutes 42 seconds Radiation Exposure Index (if provided by the fluoroscopic device): 25.4 mGy Number of Acquired Spot Images: 3 plus multiple fluoroscopic screen captures COMPARISON:  CT abdomen and pelvis 02/23/2017 FINDINGS: Normal bowel gas pattern on scout image. No urinary tract calcification or acute bone lesions. Normal esophageal distention and motility. No esophageal mass, stricture, or hiatal hernia. Stomach distends normally with normal rugal fold pattern. No gastric mass or  ulceration. Irregularity of the pyloric channel with mucosal fold thickening extending into the base the duodenal bulb consistent with gastritis/duodenitis. No discrete ulcer cavity is seen at the distal gastric antrum, pylorus or duodenal bulb. Duodenal sweep normal appearance with normal position of ligament of Treitz. Visualized jejunal loops normal appearance. IMPRESSION: Mucosal fold thickening at the pyloric channel into the proximal duodenal bulb compatible with gastritis/ duodenitis, likely representing sequela of the previously identified pyloric channel ulcer seen at endoscopy. No current ulcer is identified. Remainder of exam unremarkable. Electronically Signed   By: Lavonia Dana M.D.   On: 05/18/2017 09:42   Ct Renal Stone Study  Result Date: 06/13/2017 CLINICAL DATA:  Hematuria. Upper abdominal and bilateral flank pain. EXAM: CT ABDOMEN AND PELVIS WITHOUT CONTRAST TECHNIQUE: Multidetector CT imaging of the abdomen and pelvis was performed following the standard protocol without IV contrast. COMPARISON:  Noncontrast CT yesterday. FINDINGS: Lower chest: Chronic scarring in the right lung base. Probable basilar emphysema. Hepatobiliary: No focal hepatic lesion on noncontrast exam. Unchanged gallbladder distention without pericholecystic inflammation. No calcified gallstone. No common bile duct  dilatation. Pancreas: No ductal dilatation or inflammation. Spleen: Normal in size without focal abnormality. Adrenals/Urinary Tract: No adrenal nodule. Persistent but decreased fullness of the right renal collecting system with question of right urothelial thickening. Layering high attenuation material in the lower pole calyx on prior exam is not seen. Resolved left renal collecting system fullness and hyper attenuation in the lower left kidney. The previous high density in the distal left ureter is not definitively seen. No discrete urolithiasis. No perinephric edema. Urinary bladder is physiologically  distended. No bladder wall thickening. Stomach/Bowel: Stomach is distended with ingested material. No bowel obstruction or wall thickening. Large colonic stool burden throughout the entire colon, similar to prior exam. There is colonic tortuosity. Normal appendix. Vascular/Lymphatic: Minimal aortic atherosclerosis without aneurysm. Prominent periaortic nodes appear similar to exam yesterday. No progressive adenopathy. Reproductive: Uterus and bilateral adnexa are unremarkable. Other: No free air, free fluid, or intra-abdominal fluid collection. Musculoskeletal: There are no acute or suspicious osseous abnormalities. IMPRESSION: 1. Decreased fullness of both renal collecting systems from exam yesterday. Previous high attenuation material in bilateral lower pole collecting systems has resolved. 2. Previous high density in the distal left ureter has resolved, there is been passage of stone or clearing of hemorrhage or debris. 3. Chronic findings include stable gallbladder distention, stable large colonic stool burden, and aortic atherosclerosis. Electronically Signed   By: Jeb Levering M.D.   On: 06/13/2017 20:17   Ct Renal Stone Study  Result Date: 06/12/2017 CLINICAL DATA:  Upper abdominal pain radiates into the flank area for 2 weeks. Hematuria. EXAM: CT ABDOMEN AND PELVIS WITHOUT CONTRAST TECHNIQUE: Multidetector CT imaging of the abdomen and pelvis was performed following the standard protocol without IV contrast. COMPARISON:  02/23/2017.  03/26/2015. FINDINGS: Lower chest: 8 mm cavitary lesion posterior right costophrenic sulcus unchanged since 03/26/2015 compatible with benign etiology. Hepatobiliary: No focal abnormality in the liver on this study without intravenous contrast. Gallbladder is markedly distended measuring 5.6 cm in diameter. No intrahepatic or extrahepatic biliary dilation. Pancreas: No focal mass lesion. No dilatation of the main duct. No intraparenchymal cyst. No peripancreatic edema.  Spleen: No splenomegaly. No focal mass lesion. Adrenals/Urinary Tract: No adrenal nodule or mass. Mild fullness of the right intrarenal collecting system is borderline for mild hydronephrosis. There is some layering high attenuation material in a lower pole calyx. Ureter is upper normal diameter throughout. Similar mild fullness noted in the left kidney with layering high attenuation material in the lower pole calyx of this kidney is well. Although left ureter is less distended than on the right, there is a focal area of high density in the distal left ureter (image 74 series 2 and image 48 series 5) that is probably a tiny distal left ureteral stone. The urinary bladder appears normal for the degree of distention.  ) Stomach/Bowel: Stomach is nondistended. No gastric wall thickening. No evidence of outlet obstruction. Duodenum is normally positioned as is the ligament of Treitz. No small bowel wall thickening. No small bowel dilatation. The terminal ileum is normal. The appendix is normal. Slightly increased attenuation of colonic contents may be trace residua from the upper GI series of 05/18/2017. Vascular/Lymphatic: There is abdominal aortic atherosclerosis without aneurysm. Upper normal lymph nodes are seen in the para-aortic space. No pelvic sidewall lymphadenopathy. Reproductive: The uterus has normal CT imaging appearance. There is no adnexal mass. Other: No intraperitoneal free fluid. Musculoskeletal: Bone windows reveal no worrisome lytic or sclerotic osseous lesions. IMPRESSION: 1. Mild fullness of both intrarenal collecting  systems, right slightly more than left with associated mild right ureteral fullness. There is high attenuation material and lower pole collecting systems of each kidney which may represent hemorrhage or infectious debris. These do not appear quite focal enough to represent stones. 2. Tiny focus of high attenuation in the distal left ureter likely a small stone although a focal  collection of hemorrhage or debris is possible. 3. Marked gallbladder distention without associated biliary dilatation. Electronically Signed   By: Misty Stanley M.D.   On: 06/12/2017 16:23   US Abdomen Limited Ruq  Result Date: 06/14/2017 CLINICAL DATA:  Abdominal pain EXAM: ULTRASOUND ABDOMEN LIMITED RIGHT UPPER QUADRANT COMPARISON:  CT 06/13/2017 FINDINGS: Gallbladder: Mild distention of the gallbladder. No visible stones or wall thickening. Negative sonographic Murphy's. Common bile duct: Diameter: Normal caliber, 4 mm Liver: No focal lesion identified. Within normal limits in parenchymal echogenicity. IMPRESSION: Gallbladder is mildly distended, but no visible stones or wall thickening. No acute findings. Electronically Signed   By: Rolm Baptise M.D.   On: 06/14/2017 09:32        Filed Weights   06/12/17 1223  Weight: 47.6 kg (105 lb)     Microbiology: Recent Results (from the past 240 hour(s))  Urine Culture     Status: None   Collection Time: 06/12/17 12:30 PM  Result Value Ref Range Status   Specimen Description URINE, RANDOM  Final   Special Requests NONE  Final   Culture   Final    NO GROWTH Performed at [facility], [address], [place], [place] [address]    Report Status 06/14/2017 FINAL  Final       Blood Culture    Component Value Date/Time   SDES URINE, RANDOM 06/12/2017 1230   SPECREQUEST NONE 06/12/2017 1230   CULT  06/12/2017 1230    NO GROWTH Performed at [facility], [place] [address], [place], [place] [address]    REPTSTATUS 06/14/2017 FINAL 06/12/2017 1230      Labs: Results for orders placed or performed during the hospital encounter of 06/12/17 (from the past 48 hour(s))  Urinalysis, Routine w reflex microscopic     Status: Abnormal   Collection Time: 06/12/17 12:30 PM  Result Value Ref Range   Color, Urine RED (A) YELLOW    Comment: BIOCHEMICALS MAY BE AFFECTED BY COLOR   APPearance CLOUDY (A) CLEAR   Specific Gravity, Urine   1.005 - 1.030    TEST NOT REPORTED DUE TO COLOR INTERFERENCE OF URINE PIGMENT   pH  5.0 - 8.0    TEST NOT REPORTED DUE TO COLOR INTERFERENCE OF URINE PIGMENT   Glucose, UA (A) NEGATIVE mg/dL    TEST NOT REPORTED DUE TO COLOR INTERFERENCE OF URINE PIGMENT   Hgb urine dipstick (A) NEGATIVE    TEST NOT REPORTED DUE TO COLOR INTERFERENCE OF URINE PIGMENT   Bilirubin Urine (A) NEGATIVE    TEST NOT REPORTED DUE TO COLOR INTERFERENCE OF URINE PIGMENT   Ketones, ur (A) NEGATIVE mg/dL    TEST NOT REPORTED DUE TO COLOR INTERFERENCE OF URINE PIGMENT   Protein, ur (A) NEGATIVE mg/dL    TEST NOT REPORTED DUE TO COLOR INTERFERENCE OF URINE PIGMENT   Nitrite (A) NEGATIVE    TEST NOT REPORTED DUE TO COLOR INTERFERENCE OF URINE PIGMENT   Leukocytes, UA (A) NEGATIVE    TEST NOT REPORTED DUE TO COLOR INTERFERENCE OF URINE PIGMENT  Urinalysis, Microscopic (reflex)     Status: Abnormal  Collection Time: 06/12/17 12:30 PM  Result Value Ref Range   RBC / HPF TOO NUMEROUS TO COUNT 0 - 5 RBC/hpf   WBC, UA 0-5 0 - 5 WBC/hpf   Bacteria, UA MANY (A) NONE SEEN   Squamous Epithelial / LPF NONE SEEN NONE SEEN  Urine Culture     Status: None   Collection Time: 06/12/17 12:30 PM  Result Value Ref Range   Specimen Description URINE, RANDOM    Special Requests NONE    Culture      NO GROWTH Performed at [facility], [place] [address] [place], [place] [address]    Report Status 06/14/2017 FINAL   Pregnancy, urine     Status: None   Collection Time: 06/12/17 12:30 PM  Result Value Ref Range   Preg Test, Ur NEGATIVE NEGATIVE  I-Stat Chem 8, ED     Status: Abnormal   Collection Time: 06/12/17  1:19 PM  Result Value Ref Range   Sodium 135 135 - 145 mmol/L   Potassium 4.7 3.5 - 5.1 mmol/L   Chloride 114 (H) 101 - 111 mmol/L   BUN 37 (H) 6 - 20 mg/dL   Creatinine, Ser 2.10 (H) 0.44 - 1.00 mg/dL   Glucose, Bld 119 (H) 65 - 99 mg/dL   Calcium, Ion 0.96 (L) 1.15 - 1.40 mmol/L   TCO2 16 0 - 100 mmol/L    Hemoglobin 11.9 (L) 12.0 - 15.0 g/dL   HCT 35.0 (L) 36.0 - 46.0 %  CBC with Differential     Status: Abnormal   Collection Time: 06/12/17  2:52 PM  Result Value Ref Range   WBC 8.8 4.0 - 10.5 K/uL   RBC 4.19 3.87 - 5.11 MIL/uL   Hemoglobin 11.5 (L) 12.0 - 15.0 g/dL   HCT 34.5 (L) 36.0 - 46.0 %   MCV 82.3 78.0 - 100.0 fL   MCH 27.4 26.0 - 34.0 pg   MCHC 33.3 30.0 - 36.0 g/dL   RDW 18.3 (H) 11.5 - 15.5 %   Platelets 382 150 - 400 K/uL   Neutrophils Relative % 73 %   Neutro Abs 6.5 1.7 - 7.7 K/uL   Lymphocytes Relative 16 %   Lymphs Abs 1.4 0.7 - 4.0 K/uL   Monocytes Relative 7 %   Monocytes Absolute 0.6 0.1 - 1.0 K/uL   Eosinophils Relative 3 %   Eosinophils Absolute 0.3 0.0 - 0.7 K/uL   Basophils Relative 1 %   Basophils Absolute 0.1 0.0 - 0.1 K/uL  Basic metabolic panel     Status: Abnormal   Collection Time: 06/12/17  2:52 PM  Result Value Ref Range   Sodium 136 135 - 145 mmol/L   Potassium 4.2 3.5 - 5.1 mmol/L   Chloride 107 101 - 111 mmol/L   CO2 16 (L) 22 - 32 mmol/L   Glucose, Bld 109 (H) 65 - 99 mg/dL   BUN 29 (H) 6 - 20 mg/dL   Creatinine, Ser 1.93 (H) 0.44 - 1.00 mg/dL   Calcium 8.9 8.9 - 10.3 mg/dL   GFR calc non Af Amer 30 (L) >60 mL/min   GFR calc Af Amer 35 (L) >60 mL/min    Comment: (NOTE) The eGFR has been calculated using the CKD EPI equation. This calculation has not been validated in all clinical situations. eGFR's persistently <60 mL/min signify possible Chronic Kidney Disease.    Anion gap 13 5 - 15  Urine rapid drug screen (hosp performed)  Status: Abnormal   Collection Time: 06/12/17  8:30 PM  Result Value Ref Range   Opiates NONE DETECTED NONE DETECTED   Cocaine NONE DETECTED NONE DETECTED   Benzodiazepines POSITIVE (A) NONE DETECTED   Amphetamines NONE DETECTED NONE DETECTED   Tetrahydrocannabinol NONE DETECTED NONE DETECTED   Barbiturates NONE DETECTED NONE DETECTED    Comment:        DRUG SCREEN FOR MEDICAL PURPOSES ONLY.  IF  CONFIRMATION IS NEEDED FOR ANY PURPOSE, NOTIFY LAB WITHIN 5 DAYS.        LOWEST DETECTABLE LIMITS FOR URINE DRUG SCREEN Drug Class       Cutoff (ng/mL) Amphetamine      1000 Barbiturate      200 Benzodiazepine   115 Tricyclics       726 Opiates          300 Cocaine          300 THC              50   Basic metabolic panel     Status: Abnormal   Collection Time: 06/13/17  5:53 AM  Result Value Ref Range   Sodium 138 135 - 145 mmol/L   Potassium 3.5 3.5 - 5.1 mmol/L   Chloride 111 101 - 111 mmol/L   CO2 18 (L) 22 - 32 mmol/L   Glucose, Bld 92 65 - 99 mg/dL   BUN 26 (H) 6 - 20 mg/dL   Creatinine, Ser 1.73 (H) 0.44 - 1.00 mg/dL   Calcium 7.8 (L) 8.9 - 10.3 mg/dL   GFR calc non Af Amer 34 (L) >60 mL/min   GFR calc Af Amer 40 (L) >60 mL/min    Comment: (NOTE) The eGFR has been calculated using the CKD EPI equation. This calculation has not been validated in all clinical situations. eGFR's persistently <60 mL/min signify possible Chronic Kidney Disease.    Anion gap 9 5 - 15  CBC     Status: Abnormal   Collection Time: 06/13/17  5:53 AM  Result Value Ref Range   WBC 7.1 4.0 - 10.5 K/uL   RBC 3.81 (L) 3.87 - 5.11 MIL/uL   Hemoglobin 10.4 (L) 12.0 - 15.0 g/dL   HCT 31.5 (L) 36.0 - 46.0 %   MCV 82.7 78.0 - 100.0 fL   MCH 27.3 26.0 - 34.0 pg   MCHC 33.0 30.0 - 36.0 g/dL   RDW 18.5 (H) 11.5 - 15.5 %   Platelets 332 150 - 400 K/uL  TSH     Status: Abnormal   Collection Time: 06/13/17  5:53 AM  Result Value Ref Range   TSH 0.144 (L) 0.350 - 4.500 uIU/mL    Comment: Performed by a 3rd Generation assay with a functional sensitivity of <=0.01 uIU/mL.  T4, free     Status: Abnormal   Collection Time: 06/13/17  5:53 AM  Result Value Ref Range   Free T4 0.60 (L) 0.61 - 1.12 ng/dL    Comment: (NOTE) Biotin ingestion may interfere with free T4 tests. If the results are inconsistent with the TSH level, previous test results, or the clinical presentation, then consider biotin  interference. If needed, order repeat testing after stopping biotin. Performed at [facility], [place] [address], [place], [place] [address]   Comprehensive metabolic panel     Status: Abnormal   Collection Time: 06/14/17  5:59 AM  Result Value Ref Range   Sodium 144 135 - 145 mmol/L   Potassium 3.5  3.5 - 5.1 mmol/L   Chloride 119 (H) 101 - 111 mmol/L   CO2 20 (L) 22 - 32 mmol/L   Glucose, Bld 84 65 - 99 mg/dL   BUN 14 6 - 20 mg/dL   Creatinine, Ser 1.07 (H) 0.44 - 1.00 mg/dL   Calcium 7.5 (L) 8.9 - 10.3 mg/dL   Total Protein 4.3 (L) 6.5 - 8.1 g/dL   Albumin 1.9 (L) 3.5 - 5.0 g/dL   AST 17 15 - 41 U/L   ALT 18 14 - 54 U/L   Alkaline Phosphatase 45 38 - 126 U/L   Total Bilirubin 0.6 0.3 - 1.2 mg/dL   GFR calc non Af Amer >60 >60 mL/min   GFR calc Af Amer >60 >60 mL/min    Comment: (NOTE) The eGFR has been calculated using the CKD EPI equation. This calculation has not been validated in all clinical situations. eGFR's persistently <60 mL/min signify possible Chronic Kidney Disease.    Anion gap 5 5 - 15  CBC     Status: Abnormal   Collection Time: 06/14/17  5:59 AM  Result Value Ref Range   WBC 6.5 4.0 - 10.5 K/uL   RBC 3.35 (L) 3.87 - 5.11 MIL/uL   Hemoglobin 9.1 (L) 12.0 - 15.0 g/dL   HCT 28.1 (L) 36.0 - 46.0 %   MCV 83.9 78.0 - 100.0 fL   MCH 27.2 26.0 - 34.0 pg   MCHC 32.4 30.0 - 36.0 g/dL   RDW 18.4 (H) 11.5 - 15.5 %   Platelets 295 150 - 400 K/uL       HPI :  Deanna Berry is a 46 y.o. female with medical history significant of remote substance abuse, PUD, hypothyroidism, bipolar, cleft palate, and constipation presenting with abdominal pain and hematuria. CT scan indicated hemorrhage/debris in renal collecting system. Urology consulted. She was started on antibiotics for possible UTI.  HOSPITAL COURSE:    1. Abdominal pain with hematuria. CT scan of the abdomen and pelvis indicated fullness in renal collecting system bilaterally with possible debris/hemorrhage  and ureters. Urology   Consulted. She underwent a noncontrast CT which showed bilateral clot in her collecting system and left distal ureteral hyperdensity which could be a small stone versus clot. No other calculi noted.   . Etiology of her abdominal pain may be related to urinary tract findings. Urology recommended follow-up in the outpatient setting in 3-4 weeks. Would continue antibiotics for another 5 days. CT scan also indicated distended gallbladder.  Abdominal ultrasound showed gallbladder mildly distended but no visible stones or wall thickening.   LFTs within normal limits.   2. Possible urinary tract infection. Hematuria may be related to possible infection. Placed on Rocephin. Now changed to cefpodoxime for another 5 days. Follow-up urine culture, no growth so far     3. Acute kidney injury. Possibly related to dehydration versus postobstructive uropathy. Improving with IV hydration. Continue to follow labs in a.m .   4. Peptic ulcer disease/constipation. Followed by GI. Recently had EGD on 8/7 that showed one nonbleeding cratered ulcer, intrinsic severe pyloric stenosis status post incomplete dilatation, stop due to presence of pyloric channel ulcer. She is on Protonix. She also receives Linzess for constipation.   5. Bipolar disorder. Continue Tegretol, Seroquel, Trintellix   6. Hypothyroidism. TSH is low. Decrease Synthroid from 75 mcg to 50 mcg. Repeat thyroid studies in 4 weeks  Discharge Exam:   Blood pressure 100/66, pulse 95, temperature 98.3 F (36.8 C), temperature source Oral,  resp. rate 20, height 5' (1.524 m), weight 47.6 kg (105 lb), SpO2 97 %. Cardiovascular system: S1 & S2 heard, RRR. No JVD, murmurs, rubs, gallops or clicks. No pedal edema. Gastrointestinal system: Abdomen is nondistended, soft and tender in epigastrium and right upper quadrant. She also has bilateral flank tenderness.  No organomegaly or masses felt. Normal bowel sounds heard. Central nervous  system: Alert and oriented. No focal neurological deficits. Extremities: Symmetric 5 x 5 power. Skin: No rashes, lesions or ulcers Psychiatry: Judgement and insight appear normal. Mood & affect appropriate.      Follow-up Information    Redmond School, MD. Call.   Specialty:  Internal Medicine Why:  Hospital follow-up in 3-5 days Contact information: [place] [address] [phone]           Signed: Reyne Dumas 06/14/2017, 10:56 AM        Time spent >1 hour

## 2017-06-14 NOTE — Telephone Encounter (Signed)
Please call pt. HER stomach Bx shows gastritis DUE TO ASA USE. STRICTLY AVOID ASPIRIN, BC/GOODY POWDERS, IBUPROFEN/MOTRIN, OR NAPROXEN/ALEVE BECAUSE YOU HAVE A HISTORY OF ULCERS CAUSING PARTIAL GASTRIC OUTLET OBSTRUCTION.   DRINK WATER TO KEEP YOUR URINE LIGHT YELLOW. FOLLOW A SOFT MECHANICAL DIET.  MEATS SHOULD BE CHOPPED OR GROUND ONLY. DO NOT EAT CHUNKS OF ANYTHING.   REPEAT EGD FOR PYLORIC DILATION IN 2 MOS, Dx: GASTRIC OUTLETOBSTRUCTION.  FOLLOW UP APPT IN 6 MONTHS E30 PUD/GOO

## 2017-06-15 NOTE — Telephone Encounter (Signed)
Pt is aware.  

## 2017-06-16 NOTE — Progress Notes (Signed)
REVIEWED-NO ADDITIONAL RECOMMENDATIONS. 

## 2017-06-20 ENCOUNTER — Telehealth: Payer: Self-pay | Admitting: Gastroenterology

## 2017-06-20 DIAGNOSIS — E039 Hypothyroidism, unspecified: Secondary | ICD-10-CM | POA: Diagnosis not present

## 2017-06-20 DIAGNOSIS — N342 Other urethritis: Secondary | ICD-10-CM | POA: Diagnosis not present

## 2017-06-20 DIAGNOSIS — Z681 Body mass index (BMI) 19 or less, adult: Secondary | ICD-10-CM | POA: Diagnosis not present

## 2017-06-20 DIAGNOSIS — Z1389 Encounter for screening for other disorder: Secondary | ICD-10-CM | POA: Diagnosis not present

## 2017-06-20 DIAGNOSIS — R319 Hematuria, unspecified: Secondary | ICD-10-CM | POA: Diagnosis not present

## 2017-06-20 DIAGNOSIS — N179 Acute kidney failure, unspecified: Secondary | ICD-10-CM | POA: Diagnosis not present

## 2017-06-20 DIAGNOSIS — N390 Urinary tract infection, site not specified: Secondary | ICD-10-CM | POA: Diagnosis not present

## 2017-06-20 DIAGNOSIS — R5383 Other fatigue: Secondary | ICD-10-CM | POA: Diagnosis not present

## 2017-06-20 NOTE — Telephone Encounter (Signed)
Pt is aware.  

## 2017-06-20 NOTE — Telephone Encounter (Signed)
Take Linzess with food, as this could help it have greater effect.   If no improvement with this, add Miralax BID. For immediate relief now, can take Miralax 1 capful every hour up to 6 doses, followed by full glass of water after each dose.

## 2017-06-20 NOTE — Telephone Encounter (Signed)
PT said she had been doing well on the Linzess 290 mcg every morning until she went to the hospital for kidney stones.  They gave the Linzess to her in the hospital, but the first morning it was on later in the morning when she took it.  After that she had them give it to her very early.  She has not had a BM in about a week.  Please advise!Marland Kitchen

## 2017-06-20 NOTE — Telephone Encounter (Signed)
Patient called and stated that it has been a week since she has gone to the bathroom.  Needs to know what to do , been taking medicine she was given here and not working.  [phone]

## 2017-06-26 ENCOUNTER — Encounter (HOSPITAL_COMMUNITY): Payer: Self-pay | Admitting: Gastroenterology

## 2017-06-26 LAB — PROTEIN ELECTROPHORESIS, SERUM, WITH REFLEX

## 2017-06-28 ENCOUNTER — Other Ambulatory Visit: Payer: Self-pay

## 2017-06-28 DIAGNOSIS — R809 Proteinuria, unspecified: Secondary | ICD-10-CM

## 2017-06-29 ENCOUNTER — Other Ambulatory Visit: Payer: Self-pay

## 2017-06-29 DIAGNOSIS — R809 Proteinuria, unspecified: Secondary | ICD-10-CM

## 2017-06-29 DIAGNOSIS — D649 Anemia, unspecified: Secondary | ICD-10-CM | POA: Diagnosis not present

## 2017-06-29 NOTE — Progress Notes (Signed)
REVIEWED-NO ADDITIONAL RECOMMENDATIONS. 

## 2017-06-29 NOTE — Telephone Encounter (Signed)
LMOM to call.

## 2017-07-03 ENCOUNTER — Emergency Department (HOSPITAL_COMMUNITY)
Admission: EM | Admit: 2017-07-03 | Discharge: 2017-07-03 | Disposition: A | Discharge status: Home / Self Care | Payer: BLUE CROSS/BLUE SHIELD | Attending: Emergency Medicine | Admitting: Emergency Medicine

## 2017-07-03 ENCOUNTER — Encounter (HOSPITAL_COMMUNITY): Payer: Self-pay | Admitting: Emergency Medicine

## 2017-07-03 ENCOUNTER — Emergency Department (HOSPITAL_COMMUNITY): Payer: BLUE CROSS/BLUE SHIELD

## 2017-07-03 DIAGNOSIS — F5089 Other specified eating disorder: Secondary | ICD-10-CM | POA: Diagnosis not present

## 2017-07-03 DIAGNOSIS — K2970 Gastritis, unspecified, without bleeding: Secondary | ICD-10-CM | POA: Diagnosis not present

## 2017-07-03 DIAGNOSIS — R109 Unspecified abdominal pain: Secondary | ICD-10-CM | POA: Diagnosis not present

## 2017-07-03 DIAGNOSIS — R918 Other nonspecific abnormal finding of lung field: Secondary | ICD-10-CM | POA: Diagnosis not present

## 2017-07-03 DIAGNOSIS — E039 Hypothyroidism, unspecified: Secondary | ICD-10-CM | POA: Diagnosis not present

## 2017-07-03 DIAGNOSIS — Z79899 Other long term (current) drug therapy: Secondary | ICD-10-CM | POA: Insufficient documentation

## 2017-07-03 DIAGNOSIS — F17210 Nicotine dependence, cigarettes, uncomplicated: Secondary | ICD-10-CM | POA: Insufficient documentation

## 2017-07-03 DIAGNOSIS — R10812 Left upper quadrant abdominal tenderness: Secondary | ICD-10-CM | POA: Diagnosis not present

## 2017-07-03 DIAGNOSIS — K5900 Constipation, unspecified: Secondary | ICD-10-CM

## 2017-07-03 DIAGNOSIS — K56609 Unspecified intestinal obstruction, unspecified as to partial versus complete obstruction: Secondary | ICD-10-CM | POA: Diagnosis not present

## 2017-07-03 LAB — URINALYSIS, ROUTINE W REFLEX MICROSCOPIC
Bacteria, UA: NONE SEEN
Bilirubin Urine: NEGATIVE
GLUCOSE, UA: 50 mg/dL — AB
KETONES UR: NEGATIVE mg/dL
LEUKOCYTES UA: NEGATIVE
Nitrite: NEGATIVE
PH: 7 (ref 5.0–8.0)
Protein, ur: 100 mg/dL — AB
SPECIFIC GRAVITY, URINE: 1.015 (ref 1.005–1.030)
Squamous Epithelial / HPF: NONE SEEN

## 2017-07-03 LAB — CBC WITH DIFFERENTIAL/PLATELET
Basophils Absolute: 0.1 10*3/uL (ref 0.0–0.1)
Basophils Relative: 1 %
EOS PCT: 3 %
Eosinophils Absolute: 0.3 10*3/uL (ref 0.0–0.7)
HCT: 27.4 % — ABNORMAL LOW (ref 36.0–46.0)
Hemoglobin: 9.1 g/dL — ABNORMAL LOW (ref 12.0–15.0)
LYMPHS ABS: 2.8 10*3/uL (ref 0.7–4.0)
LYMPHS PCT: 30 %
MCH: 29 pg (ref 26.0–34.0)
MCHC: 33.2 g/dL (ref 30.0–36.0)
MCV: 87.3 fL (ref 78.0–100.0)
MONO ABS: 0.8 10*3/uL (ref 0.1–1.0)
MONOS PCT: 9 %
Neutro Abs: 5.5 10*3/uL (ref 1.7–7.7)
Neutrophils Relative %: 58 %
PLATELETS: 438 10*3/uL — AB (ref 150–400)
RBC: 3.14 MIL/uL — ABNORMAL LOW (ref 3.87–5.11)
RDW: 17.1 % — AB (ref 11.5–15.5)
WBC: 9.5 10*3/uL (ref 4.0–10.5)

## 2017-07-03 LAB — COMPREHENSIVE METABOLIC PANEL
ALT: 49 U/L (ref 14–54)
AST: 44 U/L — ABNORMAL HIGH (ref 15–41)
Albumin: 2.6 g/dL — ABNORMAL LOW (ref 3.5–5.0)
Alkaline Phosphatase: 76 U/L (ref 38–126)
Anion gap: 7 (ref 5–15)
BILIRUBIN TOTAL: 0.2 mg/dL — AB (ref 0.3–1.2)
BUN: 15 mg/dL (ref 6–20)
CHLORIDE: 105 mmol/L (ref 101–111)
CO2: 26 mmol/L (ref 22–32)
CREATININE: 0.9 mg/dL (ref 0.44–1.00)
Calcium: 8.8 mg/dL — ABNORMAL LOW (ref 8.9–10.3)
Glucose, Bld: 110 mg/dL — ABNORMAL HIGH (ref 65–99)
POTASSIUM: 3.7 mmol/L (ref 3.5–5.1)
Sodium: 138 mmol/L (ref 135–145)
TOTAL PROTEIN: 5.8 g/dL — AB (ref 6.5–8.1)

## 2017-07-03 LAB — CARBAMAZEPINE LEVEL, TOTAL: Carbamazepine Lvl: 7.7 ug/mL (ref 4.0–12.0)

## 2017-07-03 LAB — LIPASE, BLOOD: LIPASE: 78 U/L — AB (ref 11–51)

## 2017-07-03 MED ORDER — GI COCKTAIL ~~LOC~~
30.0000 mL | Freq: Once | ORAL | Status: AC
  Administered 2017-07-03: 30 mL via ORAL
  Filled 2017-07-03: qty 30

## 2017-07-03 MED ORDER — SODIUM CHLORIDE 0.9 % IV BOLUS (SEPSIS)
500.0000 mL | Freq: Once | INTRAVENOUS | Status: AC
  Administered 2017-07-03: 500 mL via INTRAVENOUS

## 2017-07-03 MED ORDER — MAGNESIUM HYDROXIDE 400 MG/5ML PO SUSP: 15.0000 mL | Freq: Two times a day (BID) | ORAL | 0 refills | Status: DC

## 2017-07-03 NOTE — ED Triage Notes (Signed)
Pt brought in by ems for LUQ/mid abd pain x 2 days. Last ate yesterday. Denies n/v/d. cbg in route 132. Nad.

## 2017-07-03 NOTE — ED Provider Notes (Signed)
[facility] DEPT Provider Note   CSN: [id number] Arrival date & time: 07/03/17  1413     History   Chief Complaint Chief Complaint  Patient presents with  . Abdominal Pain    HPI Deanna Berry is a 46 y.o. female.  HPI Patient presents with upper abdominal pain. States rather severe. The last few days. No fevers. No nausea vomiting or diarrhea. Denies constipation. Pain may have been somewhat improved with eating. No blood in the stool. States she has not had pains like this before however reviewing records she has had upper abdominal pain several times in the past with kidney stones. States this feels different than the kidney stones. States she has had some weight loss. Past Medical History:  Diagnosis Date  . Anemia   . Anxiety   . Bipolar disorder ([place])   . Cleft palate   . Constipation   . Depression   . Headache(784.0)   . Hypothyroidism   . PUD (peptic ulcer disease)   . Sinus congestion   . Substance abuse     Patient Active Problem List   Diagnosis Date Noted  . Hematuria 06/12/2017  . Acute renal failure (ARF) ([place]) 06/12/2017  . Tobacco dependence 06/12/2017  . Stenosis, pylorus   . Proteinuria 05/22/2017  . PUD (peptic ulcer disease) 05/02/2017  . AKI (acute kidney injury) ([place]) 03/22/2017  . Hypokalemia 03/22/2017  . Hyperbilirubinemia 03/22/2017  . Ataxia 03/22/2017  . Hypothyroidism 03/22/2017  . Acute cystitis without hematuria   . Anemia 03/10/2017  . Constipation 03/10/2017  . Abdominal pain 02/23/2017  . Bipolar 1 disorder ([place]) 05/31/2012  . Substance abuse 05/31/2012    Past Surgical History:  Procedure Laterality Date  . BIOPSY  06/06/2017   Procedure: BIOPSY;  Surgeon: Danie Binder, MD;  Location: [facility];  Service: Endoscopy;;  gastric  . CESAREAN SECTION    . CLEFT PALATE REPAIR  2013  . COLONOSCOPY  2016   Dr. Britta Mccreedy: normal   . ESOPHAGOGASTRODUODENOSCOPY  03/2015   Dr. Britta Mccreedy: esophagitis, 2 medium-sized ulcers in antrum  and pylorus, large ulcer in duodenal bulb, hiatal hernia, no specimens collected.   . ESOPHAGOGASTRODUODENOSCOPY  06/2015   Dr. Britta Mccreedy: small pyloric channel ulcer with mild pyloric stenosis, previous gastric and duodenal ulcers completely healed   . ESOPHAGOGASTRODUODENOSCOPY (EGD) WITH PROPOFOL N/A 03/24/2017   [facility]: Z-line regular, small hiatal hernia, non-bleeding large clean-based pyloric channel ulcer with narrowing of the pyloric channel, unable to advance scope into dudoenum, chronic gastritis  . ESOPHAGOGASTRODUODENOSCOPY (EGD) WITH PROPOFOL N/A 06/06/2017   Procedure: ESOPHAGOGASTRODUODENOSCOPY (EGD) WITH PROPOFOL;  Surgeon: Danie Binder, MD;  Location: [facility];  Service: Endoscopy;  Laterality: N/A;  7:30am  . HERNIA REPAIR    . MULTIPLE TOOTH EXTRACTIONS    . SAVORY DILATION N/A 06/06/2017   Procedure: SAVORY DILATION;  Surgeon: Danie Binder, MD;  Location: [facility];  Service: Endoscopy;  Laterality: N/A;    OB History    Gravida Para Term Preterm AB Living   2 2       2    SAB TAB Ectopic Multiple Live Births                   Home Medications    Prior to Admission medications   Medication Sig Start Date End Date Taking? Authorizing Provider  carbamazepine (TEGRETOL XR) 200 MG 12 hr tablet Take 200 mg by mouth 2 (two) times daily.  02/10/17  Yes [provider]  carbamazepine (TEGRETOL XR) 400 MG 12 hr tablet Take 400 mg by mouth 2 (two) times daily. 05/19/17  Yes [provider]  gabapentin (NEURONTIN) 800 MG tablet Take 800 mg by mouth 3 (three) times daily. 01/30/17  Yes [provider]  levothyroxine (SYNTHROID, LEVOTHROID) 50 MCG tablet Take 1 tablet by mouth daily before breakfast. 06/29/17  Yes [provider]  linaclotide (LINZESS) 290 MCG CAPS capsule Take 1 capsule (290 mcg total) by mouth daily before breakfast. 03/28/17  Yes Annitta Needs, NP  Melatonin 10 MG TABS Take 1 tablet by mouth at bedtime.   Yes [provider]  pantoprazole (PROTONIX) 40 MG tablet Take 1 tablet (40 mg total) by mouth 2 (two) times daily before a meal. 03/30/17  Yes Carlis Stable, NP  QUEtiapine (SEROQUEL XR) 300 MG 24 hr tablet Take 300 mg by mouth at bedtime. 05/29/17  Yes [provider]  sulfamethoxazole-trimethoprim (BACTRIM DS,SEPTRA DS) 800-160 MG tablet Take 1 tablet by mouth 2 (two) times daily. 06/21/17  Yes [provider]  tamsulosin (FLOMAX) 0.4 MG CAPS capsule Take 1 capsule (0.4 mg total) by mouth daily after breakfast. 06/14/17  Yes Abrol, Ascencion Dike, MD  TRINTELLIX 20 MG TABS Take 20 mg by mouth daily. 02/10/17  Yes [provider]  docusate sodium (COLACE) 100 MG capsule Take 1 capsule (100 mg total) by mouth 2 (two) times daily. 06/14/17   Reyne Dumas, MD  magnesium hydroxide (MILK OF MAGNESIA) 400 MG/5ML suspension Take 15 mLs by mouth 2 (two) times daily. 07/03/17 07/06/17  Davonna Belling, MD  nicotine (NICODERM CQ - DOSED IN MG/24 HOURS) 14 mg/24hr patch Place 1 patch (14 mg total) onto the skin daily. 06/15/17   Reyne Dumas, MD    Family History Family History  Problem Relation Age of Onset  . Colon cancer Mother 59    Social History Social History  Substance Use Topics  . Smoking status: Current Every Day Smoker    Packs/day: 1.00    Years: 31.00    Types: Cigarettes  . Smokeless tobacco: Never Used  . Alcohol use No     Allergies   Amoxil [amoxicillin]; Adhesive [tape]; and Neosporin original [bacitracin-neomycin-polymyxin]   Review of Systems Review of Systems  Constitutional: Positive for appetite change and unexpected weight change.  HENT: Negative for congestion.   Respiratory: Negative for shortness of breath.   Cardiovascular: Negative for chest pain.  Gastrointestinal: Positive for abdominal pain.  Genitourinary: Negative for flank pain and pelvic pain.  Musculoskeletal: Negative for back pain.  Neurological: Negative for seizures.  Hematological:  Negative for adenopathy.  Psychiatric/Behavioral: Negative for confusion.     Physical Exam Updated Vital Signs BP 104/81   Pulse 79   Temp 98.1 F (36.7 C) (Oral)   Resp 18   Wt 88.5 kg (195 lb)   LMP 12/01/2016   SpO2 100%   BMI 38.08 kg/m   Physical Exam  Constitutional: She appears well-developed.  HENT:  Head: Atraumatic.  Eyes: Pupils are equal, round, and reactive to light.  Neck: Neck supple.  Cardiovascular: Normal rate.   Pulmonary/Chest: Effort normal.  Abdominal: There is tenderness.  Moderate upper abdominal tenderness without rebound guarding or mass.  Musculoskeletal: She exhibits no edema.  Neurological: She is alert.  Skin: Skin is warm. Capillary refill takes less than 2 seconds.  Psychiatric: She has a normal mood and affect.     ED Treatments / Results  Labs (all labs  ordered are listed, but only abnormal results are displayed) Labs Reviewed  CBC WITH DIFFERENTIAL/PLATELET - Abnormal; Notable for the following:       Result Value   RBC 3.14 (*)    Hemoglobin 9.1 (*)    HCT 27.4 (*)    RDW 17.1 (*)    Platelets 438 (*)    All other components within normal limits  COMPREHENSIVE METABOLIC PANEL - Abnormal; Notable for the following:    Glucose, Bld 110 (*)    Calcium 8.8 (*)    Total Protein 5.8 (*)    Albumin 2.6 (*)    AST 44 (*)    Total Bilirubin 0.2 (*)    All other components within normal limits  LIPASE, BLOOD - Abnormal; Notable for the following:    Lipase 78 (*)    All other components within normal limits  URINALYSIS, ROUTINE W REFLEX MICROSCOPIC - Abnormal; Notable for the following:    APPearance HAZY (*)    Glucose, UA 50 (*)    Hgb urine dipstick LARGE (*)    Protein, ur 100 (*)    All other components within normal limits  CARBAMAZEPINE LEVEL, TOTAL    EKG  EKG Interpretation None       Radiology Dg Abdomen Acute W/chest  Result Date: 07/03/2017 CLINICAL DATA:  Nausea vomiting and weakness EXAM: DG ABDOMEN  ACUTE W/ 1V CHEST COMPARISON:  Ultrasound 06/14/2017, CT 06/13/2017, chest radiograph 03/21/2017 FINDINGS: Single-view chest demonstrates a small irregular opacity in the right upper lobe. No pleural effusion. Normal cardiomediastinal silhouette. Supine and upright views of the abdomen demonstrate no free air beneath the diaphragm. Gaseous prominence of right mid abdominal small bowel with fluid levels but distal gas demonstrated. Moderate stool in the colon. Calcified phleboliths in the pelvis. IMPRESSION: 1. Small focus of irregular opacity in the right upper lobe, cannot exclude a small focus of inflammatory process or infiltrate 2. Gaseous prominence of right mid abdominal small bowel loops with fluid levels but overall nonobstructed gas pattern. Localized ileus could be considered. Electronically Signed   By: Donavan Foil M.D.   On: 07/03/2017 16:03    Procedures Procedures (including critical care time)  Medications Ordered in ED Medications  gi cocktail (Maalox,Lidocaine,Donnatal) (30 mLs Oral Given 07/03/17 1520)  sodium chloride 0.9 % bolus 500 mL (0 mLs Intravenous Stopped 07/03/17 1632)     Initial Impression / Assessment and Plan / ED Course  I have reviewed the triage vital signs and the nursing notes.  Pertinent labs & imaging results that were available during my care of the patient were reviewed by me and considered in my medical decision making (see chart for details).     Patient with abdominal pain. Some upper abdominal tenderness. Labs overall reassuring but lipase minimally elevated but not high enough(clearly pancreatitis. His gust with Dr. Oneida Alar who knows the patient. Recommended 3 days and milk of magnesia. Will follow in the office. Patient is tolerating orals and feels somewhat better. States she is very hungry.  Final Clinical Impressions(s) / ED Diagnoses   Final diagnoses:  Abdominal pain, unspecified abdominal location  Constipation, unspecified constipation type     New Prescriptions New Prescriptions   MAGNESIUM HYDROXIDE (MILK OF MAGNESIA) 400 MG/5ML SUSPENSION    Take 15 mLs by mouth 2 (two) times daily.     Davonna Belling, MD 07/03/17 [phone]

## 2017-07-04 NOTE — Telephone Encounter (Signed)
Called. Many rings and no answer. I am mailing the lab orders to pt to do.  Also, advising her to see kidney doctor because she has protein in her urine. Ask her to please let us know if she needs a referral.

## 2017-07-05 NOTE — Telephone Encounter (Addendum)
CONTACT PT FOR EGD/DIL W/ MAC, Dx: PYLORIC STENOSIS IN OCT 2018.

## 2017-07-06 ENCOUNTER — Emergency Department (HOSPITAL_COMMUNITY): Payer: BLUE CROSS/BLUE SHIELD

## 2017-07-06 ENCOUNTER — Encounter (HOSPITAL_COMMUNITY): Payer: Self-pay

## 2017-07-06 ENCOUNTER — Observation Stay (HOSPITAL_COMMUNITY)
Admission: EM | Admit: 2017-07-06 | Discharge: 2017-07-07 | Disposition: A | Discharge status: Home / Self Care | Payer: BLUE CROSS/BLUE SHIELD | Attending: Internal Medicine | Admitting: Internal Medicine

## 2017-07-06 DIAGNOSIS — R112 Nausea with vomiting, unspecified: Secondary | ICD-10-CM

## 2017-07-06 DIAGNOSIS — Z3201 Encounter for pregnancy test, result positive: Secondary | ICD-10-CM | POA: Insufficient documentation

## 2017-07-06 DIAGNOSIS — F3130 Bipolar disorder, current episode depressed, mild or moderate severity, unspecified: Secondary | ICD-10-CM | POA: Insufficient documentation

## 2017-07-06 DIAGNOSIS — R102 Pelvic and perineal pain: Secondary | ICD-10-CM | POA: Diagnosis not present

## 2017-07-06 DIAGNOSIS — F319 Bipolar disorder, unspecified: Secondary | ICD-10-CM | POA: Diagnosis present

## 2017-07-06 DIAGNOSIS — F1910 Other psychoactive substance abuse, uncomplicated: Secondary | ICD-10-CM | POA: Insufficient documentation

## 2017-07-06 DIAGNOSIS — E039 Hypothyroidism, unspecified: Secondary | ICD-10-CM | POA: Diagnosis not present

## 2017-07-06 DIAGNOSIS — Z79899 Other long term (current) drug therapy: Secondary | ICD-10-CM | POA: Insufficient documentation

## 2017-07-06 DIAGNOSIS — O26891 Other specified pregnancy related conditions, first trimester: Secondary | ICD-10-CM | POA: Diagnosis not present

## 2017-07-06 DIAGNOSIS — G8929 Other chronic pain: Secondary | ICD-10-CM | POA: Diagnosis not present

## 2017-07-06 DIAGNOSIS — K273 Acute peptic ulcer, site unspecified, without hemorrhage or perforation: Secondary | ICD-10-CM | POA: Diagnosis not present

## 2017-07-06 DIAGNOSIS — O99330 Smoking (tobacco) complicating pregnancy, unspecified trimester: Secondary | ICD-10-CM | POA: Diagnosis not present

## 2017-07-06 DIAGNOSIS — Z3A00 Weeks of gestation of pregnancy not specified: Secondary | ICD-10-CM | POA: Diagnosis not present

## 2017-07-06 DIAGNOSIS — O26899 Other specified pregnancy related conditions, unspecified trimester: Secondary | ICD-10-CM | POA: Diagnosis not present

## 2017-07-06 DIAGNOSIS — R109 Unspecified abdominal pain: Secondary | ICD-10-CM | POA: Diagnosis not present

## 2017-07-06 DIAGNOSIS — O9989 Other specified diseases and conditions complicating pregnancy, childbirth and the puerperium: Principal | ICD-10-CM | POA: Insufficient documentation

## 2017-07-06 DIAGNOSIS — R1110 Vomiting, unspecified: Secondary | ICD-10-CM | POA: Diagnosis present

## 2017-07-06 DIAGNOSIS — F17210 Nicotine dependence, cigarettes, uncomplicated: Secondary | ICD-10-CM | POA: Diagnosis not present

## 2017-07-06 DIAGNOSIS — K279 Peptic ulcer, site unspecified, unspecified as acute or chronic, without hemorrhage or perforation: Secondary | ICD-10-CM | POA: Diagnosis present

## 2017-07-06 DIAGNOSIS — R1084 Generalized abdominal pain: Secondary | ICD-10-CM | POA: Diagnosis not present

## 2017-07-06 DIAGNOSIS — O219 Vomiting of pregnancy, unspecified: Secondary | ICD-10-CM | POA: Insufficient documentation

## 2017-07-06 DIAGNOSIS — R1010 Upper abdominal pain, unspecified: Secondary | ICD-10-CM | POA: Diagnosis not present

## 2017-07-06 HISTORY — DX: Other chronic pain: G89.29

## 2017-07-06 HISTORY — DX: Gastritis, unspecified, without bleeding: K29.70

## 2017-07-06 HISTORY — DX: Unspecified abdominal pain: R10.9

## 2017-07-06 LAB — URINALYSIS, ROUTINE W REFLEX MICROSCOPIC
Glucose, UA: NEGATIVE mg/dL
Ketones, ur: 80 mg/dL — AB
LEUKOCYTES UA: NEGATIVE
Nitrite: NEGATIVE
PROTEIN: 100 mg/dL — AB
SPECIFIC GRAVITY, URINE: 1.023 (ref 1.005–1.030)
SQUAMOUS EPITHELIAL / LPF: NONE SEEN
pH: 5 (ref 5.0–8.0)

## 2017-07-06 LAB — COMPREHENSIVE METABOLIC PANEL
ALK PHOS: 74 U/L (ref 38–126)
ALT: 41 U/L (ref 14–54)
ANION GAP: 14 (ref 5–15)
AST: 22 U/L (ref 15–41)
Albumin: 3.1 g/dL — ABNORMAL LOW (ref 3.5–5.0)
BILIRUBIN TOTAL: 1.4 mg/dL — AB (ref 0.3–1.2)
BUN: 17 mg/dL (ref 6–20)
CALCIUM: 9.2 mg/dL (ref 8.9–10.3)
CO2: 25 mmol/L (ref 22–32)
CREATININE: 1.02 mg/dL — AB (ref 0.44–1.00)
Chloride: 102 mmol/L (ref 101–111)
Glucose, Bld: 95 mg/dL (ref 65–99)
Potassium: 4.8 mmol/L (ref 3.5–5.1)
SODIUM: 141 mmol/L (ref 135–145)
Total Protein: 6.6 g/dL (ref 6.5–8.1)

## 2017-07-06 LAB — RAPID URINE DRUG SCREEN, HOSP PERFORMED
AMPHETAMINES: NOT DETECTED
BENZODIAZEPINES: NOT DETECTED
Barbiturates: POSITIVE — AB
Cocaine: NOT DETECTED
Opiates: NOT DETECTED
TETRAHYDROCANNABINOL: NOT DETECTED

## 2017-07-06 LAB — DIFFERENTIAL
BASOS ABS: 0 10*3/uL (ref 0.0–0.1)
BASOS PCT: 0 %
Eosinophils Absolute: 0 10*3/uL (ref 0.0–0.7)
Eosinophils Relative: 0 %
LYMPHS PCT: 14 %
Lymphs Abs: 1.6 10*3/uL (ref 0.7–4.0)
Monocytes Absolute: 0.7 10*3/uL (ref 0.1–1.0)
Monocytes Relative: 6 %
NEUTROS ABS: 9 10*3/uL — AB (ref 1.7–7.7)
NEUTROS PCT: 79 %

## 2017-07-06 LAB — CBC
HCT: 29.6 % — ABNORMAL LOW (ref 36.0–46.0)
HEMOGLOBIN: 9.7 g/dL — AB (ref 12.0–15.0)
MCH: 28.9 pg (ref 26.0–34.0)
MCHC: 32.8 g/dL (ref 30.0–36.0)
MCV: 88.1 fL (ref 78.0–100.0)
Platelets: 629 10*3/uL — ABNORMAL HIGH (ref 150–400)
RBC: 3.36 MIL/uL — AB (ref 3.87–5.11)
RDW: 17.3 % — AB (ref 11.5–15.5)
WBC: 11.4 10*3/uL — AB (ref 4.0–10.5)

## 2017-07-06 LAB — ETHANOL

## 2017-07-06 LAB — LIPASE, BLOOD: Lipase: 26 U/L (ref 11–51)

## 2017-07-06 LAB — PREGNANCY, URINE: Preg Test, Ur: POSITIVE — AB

## 2017-07-06 LAB — HCG, QUANTITATIVE, PREGNANCY: hCG, Beta Chain, Quant, S: 9 m[IU]/mL — ABNORMAL HIGH (ref <5)

## 2017-07-06 MED ORDER — METOCLOPRAMIDE HCL 10 MG PO TABS
5.0000 mg | ORAL_TABLET | Freq: Once | ORAL | Status: DC
  Filled 2017-07-06: qty 1

## 2017-07-06 MED ORDER — VORTIOXETINE HBR 20 MG PO TABS
20.0000 mg | ORAL_TABLET | Freq: Every day | ORAL | Status: DC
  Administered 2017-07-07: 20 mg via ORAL
  Filled 2017-07-06 (×4): qty 20

## 2017-07-06 MED ORDER — SODIUM CHLORIDE 0.9 % IV BOLUS (SEPSIS)
1000.0000 mL | Freq: Once | INTRAVENOUS | Status: AC
  Administered 2017-07-06: 1000 mL via INTRAVENOUS

## 2017-07-06 MED ORDER — LEVOTHYROXINE SODIUM 50 MCG PO TABS
50.0000 ug | ORAL_TABLET | Freq: Every day | ORAL | Status: DC
  Administered 2017-07-07: 50 ug via ORAL
  Filled 2017-07-06: qty 1

## 2017-07-06 MED ORDER — SUCRALFATE 1 G PO TABS
1.0000 g | ORAL_TABLET | Freq: Three times a day (TID) | ORAL | Status: DC
Start: 2017-07-07 — End: 2017-07-07
  Administered 2017-07-07 (×3): 1 g via ORAL
  Filled 2017-07-06 (×3): qty 1

## 2017-07-06 MED ORDER — METOCLOPRAMIDE HCL 5 MG/ML IJ SOLN
5.0000 mg | Freq: Once | INTRAMUSCULAR | Status: AC
  Administered 2017-07-06: 5 mg via INTRAVENOUS
  Filled 2017-07-06: qty 2

## 2017-07-06 MED ORDER — LINACLOTIDE 145 MCG PO CAPS
290.0000 ug | ORAL_CAPSULE | Freq: Every day | ORAL | Status: DC
  Administered 2017-07-07: 290 ug via ORAL
  Filled 2017-07-06: qty 2

## 2017-07-06 MED ORDER — CARBAMAZEPINE ER 100 MG PO TB12
200.0000 mg | ORAL_TABLET | Freq: Two times a day (BID) | ORAL | Status: DC
  Administered 2017-07-07 (×2): 200 mg via ORAL
  Filled 2017-07-06 (×2): qty 2
  Filled 2017-07-06 (×2): qty 1
  Filled 2017-07-06 (×4): qty 2

## 2017-07-06 MED ORDER — CARBAMAZEPINE ER 400 MG PO TB12
400.0000 mg | ORAL_TABLET | Freq: Two times a day (BID) | ORAL | Status: DC
  Administered 2017-07-07 (×2): 400 mg via ORAL
  Filled 2017-07-06 (×8): qty 1

## 2017-07-06 MED ORDER — SODIUM CHLORIDE 0.9 % IV SOLN
INTRAVENOUS | Status: DC
  Administered 2017-07-06 – 2017-07-07 (×2): via INTRAVENOUS

## 2017-07-06 MED ORDER — FAMOTIDINE IN NACL 20-0.9 MG/50ML-% IV SOLN
20.0000 mg | Freq: Once | INTRAVENOUS | Status: AC
  Administered 2017-07-06: 20 mg via INTRAVENOUS
  Filled 2017-07-06: qty 50

## 2017-07-06 MED ORDER — FAMOTIDINE 20 MG PO TABS
40.0000 mg | ORAL_TABLET | Freq: Two times a day (BID) | ORAL | Status: DC
  Administered 2017-07-07: 40 mg via ORAL
  Filled 2017-07-06: qty 2

## 2017-07-06 MED ORDER — QUETIAPINE FUMARATE ER 50 MG PO TB24
300.0000 mg | ORAL_TABLET | Freq: Every day | ORAL | Status: DC
  Administered 2017-07-06: 300 mg via ORAL
  Filled 2017-07-06: qty 6

## 2017-07-06 MED ORDER — SODIUM CHLORIDE 0.9 % IV SOLN
INTRAVENOUS | Status: DC
  Administered 2017-07-06: 21:00:00 via INTRAVENOUS

## 2017-07-06 MED ORDER — PROMETHAZINE HCL 12.5 MG PO TABS: 12.5000 mg | ORAL_TABLET | Freq: Four times a day (QID) | ORAL | Status: DC | PRN

## 2017-07-06 MED ORDER — GABAPENTIN 400 MG PO CAPS
800.0000 mg | ORAL_CAPSULE | Freq: Three times a day (TID) | ORAL | Status: DC
  Administered 2017-07-06 – 2017-07-07 (×3): 800 mg via ORAL
  Filled 2017-07-06 (×3): qty 2

## 2017-07-06 NOTE — ED Provider Notes (Signed)
[facility] DEPT Provider Note   CSN: [id number] Arrival date & time: 07/06/17  1212     History   Chief Complaint Chief Complaint  Patient presents with  . Abdominal Pain    HPI Deanna Berry is a 46 y.o. female.  HPI  Pt was seen at 1655.  Per pt, c/o gradual onset and persistence of constant acute flair of her chronic upper abd "pain" for the past several months.  Has been associated with no other symptoms.  Describes the abd pain as "aching."  Denies N/V, no diarrhea, no fevers, no back pain, no rash, no CP/SOB, no black or blood in stools, no dysuria/hematuria, no vaginal bleeding/discharge. The symptoms have been associated with no other complaints. The patient has a significant history of similar symptoms previously, recently being evaluated for this complaint and multiple prior evals for same.     GI: Dr. Oneida Alar Past Medical History:  Diagnosis Date  . Anemia   . Anxiety   . Bipolar disorder ([place])   . Chronic abdominal pain   . Cleft palate   . Constipation   . Depression   . Gastritis   . Headache(784.0)   . Hypothyroidism   . PUD (peptic ulcer disease)   . Sinus congestion   . Substance abuse     Patient Active Problem List   Diagnosis Date Noted  . Hematuria 06/12/2017  . Acute renal failure (ARF) ([place]) 06/12/2017  . Tobacco dependence 06/12/2017  . Stenosis, pylorus   . Proteinuria 05/22/2017  . PUD (peptic ulcer disease) 05/02/2017  . AKI (acute kidney injury) ([place]) 03/22/2017  . Hypokalemia 03/22/2017  . Hyperbilirubinemia 03/22/2017  . Ataxia 03/22/2017  . Hypothyroidism 03/22/2017  . Acute cystitis without hematuria   . Anemia 03/10/2017  . Constipation 03/10/2017  . Abdominal pain 02/23/2017  . Bipolar 1 disorder ([place]) 05/31/2012  . Substance abuse 05/31/2012    Past Surgical History:  Procedure Laterality Date  . BIOPSY  06/06/2017   Procedure: BIOPSY;  Surgeon: Danie Binder, MD;  Location: [facility];  Service: Endoscopy;;  gastric    . CESAREAN SECTION    . CLEFT PALATE REPAIR  2013  . COLONOSCOPY  2016   Dr. Britta Mccreedy: normal   . ESOPHAGOGASTRODUODENOSCOPY  03/2015   Dr. Britta Mccreedy: esophagitis, 2 medium-sized ulcers in antrum and pylorus, large ulcer in duodenal bulb, hiatal hernia, no specimens collected.   . ESOPHAGOGASTRODUODENOSCOPY  06/2015   Dr. Britta Mccreedy: small pyloric channel ulcer with mild pyloric stenosis, previous gastric and duodenal ulcers completely healed   . ESOPHAGOGASTRODUODENOSCOPY (EGD) WITH PROPOFOL N/A 03/24/2017   [facility]: Z-line regular, small hiatal hernia, non-bleeding large clean-based pyloric channel ulcer with narrowing of the pyloric channel, unable to advance scope into dudoenum, chronic gastritis  . ESOPHAGOGASTRODUODENOSCOPY (EGD) WITH PROPOFOL N/A 06/06/2017   Procedure: ESOPHAGOGASTRODUODENOSCOPY (EGD) WITH PROPOFOL;  Surgeon: Danie Binder, MD;  Location: [facility];  Service: Endoscopy;  Laterality: N/A;  7:30am  . HERNIA REPAIR    . MULTIPLE TOOTH EXTRACTIONS    . SAVORY DILATION N/A 06/06/2017   Procedure: SAVORY DILATION;  Surgeon: Danie Binder, MD;  Location: [facility];  Service: Endoscopy;  Laterality: N/A;    OB History    Gravida Para Term Preterm AB Living   2 2       2    SAB TAB Ectopic Multiple Live Births                   Home  Medications    Prior to Admission medications   Medication Sig Start Date End Date Taking? Authorizing Provider  carbamazepine (TEGRETOL XR) 200 MG 12 hr tablet Take 200 mg by mouth 2 (two) times daily.  02/10/17   [provider]  carbamazepine (TEGRETOL XR) 400 MG 12 hr tablet Take 400 mg by mouth 2 (two) times daily. 05/19/17   [provider]  docusate sodium (COLACE) 100 MG capsule Take 1 capsule (100 mg total) by mouth 2 (two) times daily. 06/14/17   Reyne Dumas, MD  gabapentin (NEURONTIN) 800 MG tablet Take 800 mg by mouth 3 (three) times daily. 01/30/17   [provider]  levothyroxine (SYNTHROID, LEVOTHROID)  50 MCG tablet Take 1 tablet by mouth daily before breakfast. 06/29/17   [provider]  linaclotide Rolan Lipa) 290 MCG CAPS capsule Take 1 capsule (290 mcg total) by mouth daily before breakfast. 03/28/17   Annitta Needs, NP  magnesium hydroxide (MILK OF MAGNESIA) 400 MG/5ML suspension Take 15 mLs by mouth 2 (two) times daily. 07/03/17 07/06/17  Davonna Belling, MD  Melatonin 10 MG TABS Take 1 tablet by mouth at bedtime.    [provider]  nicotine (NICODERM CQ - DOSED IN MG/24 HOURS) 14 mg/24hr patch Place 1 patch (14 mg total) onto the skin daily. 06/15/17   Reyne Dumas, MD  pantoprazole (PROTONIX) 40 MG tablet Take 1 tablet (40 mg total) by mouth 2 (two) times daily before a meal. 03/30/17   Carlis Stable, NP  QUEtiapine (SEROQUEL XR) 300 MG 24 hr tablet Take 300 mg by mouth at bedtime. 05/29/17   [provider]  sulfamethoxazole-trimethoprim (BACTRIM DS,SEPTRA DS) 800-160 MG tablet Take 1 tablet by mouth 2 (two) times daily. 06/21/17   [provider]  tamsulosin (FLOMAX) 0.4 MG CAPS capsule Take 1 capsule (0.4 mg total) by mouth daily after breakfast. 06/14/17   Reyne Dumas, MD  TRINTELLIX 20 MG TABS Take 20 mg by mouth daily. 02/10/17   [provider]    Family History Family History  Problem Relation Age of Onset  . Colon cancer Mother 5    Social History Social History  Substance Use Topics  . Smoking status: Current Every Day Smoker    Packs/day: 1.00    Years: 31.00    Types: Cigarettes  . Smokeless tobacco: Never Used  . Alcohol use No     Allergies   Amoxil [amoxicillin]; Adhesive [tape]; and Neosporin original [bacitracin-neomycin-polymyxin]   Review of Systems Review of Systems ROS: Statement: All systems negative except as marked or noted in the HPI; Constitutional: Negative for fever and chills. ; ; Eyes: Negative for eye pain, redness and discharge. ; ; ENMT: Negative for ear pain, hoarseness, nasal congestion, sinus  pressure and sore throat. ; ; Cardiovascular: Negative for chest pain, palpitations, diaphoresis, dyspnea and peripheral edema. ; ; Respiratory: Negative for cough, wheezing and stridor. ; ; Gastrointestinal: +abd pain. Negative for nausea, vomiting, diarrhea, blood in stool, hematemesis, jaundice and rectal bleeding. . ; ; Genitourinary: Negative for dysuria, flank pain and hematuria. ; ; GYN:  No pelvic pain, no vaginal bleeding, no vaginal discharge, no vulvar pain. ;;  Musculoskeletal: Negative for back pain and neck pain. Negative for swelling and trauma.; ; Skin: Negative for pruritus, rash, abrasions, blisters, bruising and skin lesion.; ; Neuro: Negative for headache, lightheadedness and neck stiffness. Negative for weakness, altered level of consciousness, altered mental status, extremity weakness, paresthesias, involuntary movement, seizure and syncope.  Physical Exam Updated Vital Signs BP 117/81   Pulse 80   Temp 98.6 F (37 C) (Oral)   Resp 20   Ht 4\' 9"  (1.448 m)   Wt 43.5 kg (96 lb)   LMP 07/03/2017   SpO2 100%   BMI 20.77 kg/m   Physical Exam 1700: Physical examination:  Nursing notes reviewed; Vital signs and O2 SAT reviewed;  Constitutional: Well developed, Well nourished, Well hydrated, In no acute distress; Head:  Normocephalic, atraumatic; Eyes: EOMI, Pupils dilated bilat.  No scleral icterus; ENMT: Mouth and pharynx normal, Mucous membranes moist; Neck: Supple, Full range of motion, No lymphadenopathy; Cardiovascular: Regular rate and rhythm, No gallop; Respiratory: Breath sounds clear & equal bilaterally, No wheezes.  Speaking full sentences with ease, Normal respiratory effort/excursion; Chest: Nontender, Movement normal; Abdomen: Soft, Nontender, Nondistended, Normal bowel sounds; Genitourinary: No CVA tenderness; Extremities: Pulses normal, No tenderness, No edema, No calf edema or asymmetry.; Neuro: Drowsy, easily arousal awake/alert. Major CN grossly intact. No  facial droop. Speech slightly slurred. Moves all extremities spontaneously and to command without apparent gross focal motor deficits.; Skin: Color normal, Warm, Dry.; Psych:  Affect flat, poor eye contact.    ED Treatments / Results  Labs (all labs ordered are listed, but only abnormal results are displayed)   EKG  EKG Interpretation None       Radiology   Procedures Procedures (including critical care time)  Medications Ordered in ED Medications  sodium chloride 0.9 % bolus 1,000 mL (not administered)     Initial Impression / Assessment and Plan / ED Course  I have reviewed the triage vital signs and the nursing notes.  Pertinent labs & imaging results that were available during my care of the patient were reviewed by me and considered in my medical decision making (see chart for details).  MDM Reviewed: previous chart, nursing note and vitals Reviewed previous: labs Interpretation: labs and ultrasound   Results for orders placed or performed during the hospital encounter of 07/06/17  Lipase, blood  Result Value Ref Range   Lipase 26 11 - 51 U/L  Comprehensive metabolic panel  Result Value Ref Range   Sodium 141 135 - 145 mmol/L   Potassium 4.8 3.5 - 5.1 mmol/L   Chloride 102 101 - 111 mmol/L   CO2 25 22 - 32 mmol/L   Glucose, Bld 95 65 - 99 mg/dL   BUN 17 6 - 20 mg/dL   Creatinine, Ser 1.02 (H) 0.44 - 1.00 mg/dL   Calcium 9.2 8.9 - 10.3 mg/dL   Total Protein 6.6 6.5 - 8.1 g/dL   Albumin 3.1 (L) 3.5 - 5.0 g/dL   AST 22 15 - 41 U/L   ALT 41 14 - 54 U/L   Alkaline Phosphatase 74 38 - 126 U/L   Total Bilirubin 1.4 (H) 0.3 - 1.2 mg/dL   GFR calc non Af Amer >60 >60 mL/min   GFR calc Af Amer >60 >60 mL/min   Anion gap 14 5 - 15  CBC  Result Value Ref Range   WBC 11.4 (H) 4.0 - 10.5 K/uL   RBC 3.36 (L) 3.87 - 5.11 MIL/uL   Hemoglobin 9.7 (L) 12.0 - 15.0 g/dL   HCT 29.6 (L) 36.0 - 46.0 %   MCV 88.1 78.0 - 100.0 fL   MCH 28.9 26.0 - 34.0 pg   MCHC 32.8  30.0 - 36.0 g/dL   RDW 17.3 (H) 11.5 - 15.5 %   Platelets 629 (H) 150 -  400 K/uL  Urinalysis, Routine w reflex microscopic  Result Value Ref Range   Color, Urine AMBER (A) YELLOW   APPearance HAZY (A) CLEAR   Specific Gravity, Urine 1.023 1.005 - 1.030   pH 5.0 5.0 - 8.0   Glucose, UA NEGATIVE NEGATIVE mg/dL   Hgb urine dipstick LARGE (A) NEGATIVE   Bilirubin Urine SMALL (A) NEGATIVE   Ketones, ur 80 (A) NEGATIVE mg/dL   Protein, ur 100 (A) NEGATIVE mg/dL   Nitrite NEGATIVE NEGATIVE   Leukocytes, UA NEGATIVE NEGATIVE   RBC / HPF TOO NUMEROUS TO COUNT 0 - 5 RBC/hpf   WBC, UA 0-5 0 - 5 WBC/hpf   Bacteria, UA RARE (A) NONE SEEN   Squamous Epithelial / LPF NONE SEEN NONE SEEN   Mucus PRESENT    Hyaline Casts, UA PRESENT   Differential  Result Value Ref Range   Neutrophils Relative % 79 %   Neutro Abs 9.0 (H) 1.7 - 7.7 K/uL   Lymphocytes Relative 14 %   Lymphs Abs 1.6 0.7 - 4.0 K/uL   Monocytes Relative 6 %   Monocytes Absolute 0.7 0.1 - 1.0 K/uL   Eosinophils Relative 0 %   Eosinophils Absolute 0.0 0.0 - 0.7 K/uL   Basophils Relative 0 %   Basophils Absolute 0.0 0.0 - 0.1 K/uL   WBC Morphology WHITE COUNT CONFIRMED ON SMEAR   Urine rapid drug screen (hosp performed)  Result Value Ref Range   Opiates NONE DETECTED NONE DETECTED   Cocaine NONE DETECTED NONE DETECTED   Benzodiazepines NONE DETECTED NONE DETECTED   Amphetamines NONE DETECTED NONE DETECTED   Tetrahydrocannabinol NONE DETECTED NONE DETECTED   Barbiturates POSITIVE (A) NONE DETECTED  Pregnancy, urine  Result Value Ref Range   Preg Test, Ur POSITIVE (A) NEGATIVE  Ethanol  Result Value Ref Range   Alcohol, Ethyl (B) <5 <5 mg/dL  hCG, quantitative, pregnancy  Result Value Ref Range   hCG, Beta Chain, Quant, S 9 (H) <5 mIU/mL   US Ob Comp Less 14 Wks Result Date: 07/06/2017 CLINICAL DATA:  Pregnant patient with 2 days of abdominal pain. EXAM: OBSTETRIC <14 WK [place] AND TRANSVAGINAL OB US DOPPLER ULTRASOUND OF  OVARIES TECHNIQUE: Both transabdominal and transvaginal ultrasound examinations were performed for complete evaluation of the gestation as well as the maternal uterus, adnexal regions, and pelvic cul-de-sac. Transvaginal technique was performed to assess early pregnancy. Color and duplex Doppler ultrasound was utilized to evaluate blood flow to the ovaries. COMPARISON:  None. FINDINGS: Intrauterine gestational sac: None Maternal uterus/adnexae: The right ovary was not visualized. The left ovary is normal in appearance. Arterial and venous blood flow was seen in the left ovary. No free fluid in the pelvis. Pulsed Doppler evaluation of the left ovary demonstrates normal appearing low-resistance arterial and venous waveforms. IMPRESSION: 1. No IUP is identified. In the setting of pregnancy, this can represent early pregnancy, miscarriage, or ectopic pregnancy. Recommend clinical correlation and follow-up as clinically warranted. 2. The right ovary was not visualized. The left ovary is normal in appearance with arterial and venous blood flow seen in the left ovary. Electronically Signed   By: Dorise Bullion III M.D   On: 07/06/2017 19:47     2030:  A+ blood type, per EPIC chart review. Workup as above. Abd benign. Pt's son and father have been coming and going from her exam room. Pt's father was in the exam room when I went to re-examine her and give her testing results. Pt gave  me permission to speak with her regarding her testing results while her father was in the exam room, and specifically gave me permission to give him her testing results today. As I finished giving her the testing results, her son walked in the exam room behind me. He became agitated, balling up his fists and assuming a menacing posture, stating we "weren't helping" and "not going anything" for his mother. I explained that that behavior would not be tolerated and pt's father held him back with his arm and told him to stop. I told the pt that  she would need to tell her son her testing results if she chose to. EPIC chart reveals pt has been evaluated multiple times in the ED and by her GI MD for her chronic abd pain; pt strongly encouraged to do so again. Pt and her father verb understanding.  Pt also encouraged to f/u with OB/GYN MD in 48 hours for re-check of B-quant level, as it is minimally elevated and will need re-check to confirm pregnancy vs false result; pt and her father verb understanding. I then removed myself from the exam room for my safety. ED RN stated pt c/o nausea after I left the exam room, and I ordered IV reglan.  2110:  Pt continues to have N/V despite IV reglan x2 doses. Unable to tol PO without N/V.  T/C to OB/GYN Dr. Elonda Husky, case discussed, including:  HPI, pertinent PM/SHx, VS/PE, dx testing, ED course and treatment:  States pt is not pregnant with b-quant of 9, because LH hormone can cross-react with this test giving a spurious result, pt will need re-check of b-quant level in the next few weeks (at a different time in her cycle). OK to admit to hospitalist at [facility]. Dx and testing, as well as d/w OB/GYN MD, d/w pt and family.  Questions answered.  Verb understanding, agreeable to admit.   2125:  T/C to [facility] Dr. Shanon Brow, case discussed, including:  HPI, pertinent PM/SHx, VS/PE, dx testing, ED course and treatment:  Agreeable to admit.    Final Clinical Impressions(s) / ED Diagnoses   Final diagnoses:  Pelvic pain in pregnancy    New Prescriptions New Prescriptions   No medications on file     Francine Graven, DO 07/10/17 1532

## 2017-07-06 NOTE — H&P (Signed)
History and Physical    Deanna Berry NAT:[id number] DOB: 11-Jul-1971 DOA: 07/06/2017  PCP: Redmond School, MD  Patient coming from: home  Chief Complaint:  vomiting  HPI: Deanna Berry is a 46 y.o. female with medical history significant of  Chronic abdominal pain, bipolar, anxiety, PUD, substance abuse comes in with vomiting for over a day.  No fevers.  Has chronic abdominal pain which is the same and epigastric.  She denies any fevers.  Pt was referred for admission by dr Elise Benne in ED for intractable nausea and vomiting despite dosing of reglan.  By the time I see the patient her vomiting has resolved and she is tolerating liquids.    Review of Systems: As per HPI otherwise 10 point review of systems negative.   Past Medical History:  Diagnosis Date  . Anemia   . Anxiety   . Bipolar disorder ([place])   . Chronic abdominal pain   . Cleft palate   . Constipation   . Depression   . Gastritis   . Headache(784.0)   . Hypothyroidism   . PUD (peptic ulcer disease)   . Sinus congestion   . Substance abuse     Past Surgical History:  Procedure Laterality Date  . BIOPSY  06/06/2017   Procedure: BIOPSY;  Surgeon: Danie Binder, MD;  Location: [facility];  Service: Endoscopy;;  gastric  . CESAREAN SECTION    . CLEFT PALATE REPAIR  2013  . COLONOSCOPY  2016   Dr. Britta Mccreedy: normal   . ESOPHAGOGASTRODUODENOSCOPY  03/2015   Dr. Britta Mccreedy: esophagitis, 2 medium-sized ulcers in antrum and pylorus, large ulcer in duodenal bulb, hiatal hernia, no specimens collected.   . ESOPHAGOGASTRODUODENOSCOPY  06/2015   Dr. Britta Mccreedy: small pyloric channel ulcer with mild pyloric stenosis, previous gastric and duodenal ulcers completely healed   . ESOPHAGOGASTRODUODENOSCOPY (EGD) WITH PROPOFOL N/A 03/24/2017   [facility]: Z-line regular, small hiatal hernia, non-bleeding large clean-based pyloric channel ulcer with narrowing of the pyloric channel, unable to advance scope into dudoenum, chronic gastritis  .  ESOPHAGOGASTRODUODENOSCOPY (EGD) WITH PROPOFOL N/A 06/06/2017   Procedure: ESOPHAGOGASTRODUODENOSCOPY (EGD) WITH PROPOFOL;  Surgeon: Danie Binder, MD;  Location: [facility];  Service: Endoscopy;  Laterality: N/A;  7:30am  . HERNIA REPAIR    . MULTIPLE TOOTH EXTRACTIONS    . SAVORY DILATION N/A 06/06/2017   Procedure: SAVORY DILATION;  Surgeon: Danie Binder, MD;  Location: [facility];  Service: Endoscopy;  Laterality: N/A;     reports that she has been smoking Cigarettes.  She has a 31.00 pack-year smoking history. She has never used smokeless tobacco. She reports that she does not drink alcohol or use drugs.  Allergies  Allergen Reactions  . Amoxil [Amoxicillin] Hives    Has patient had a PCN reaction causing immediate rash, facial/tongue/throat swelling, SOB or lightheadedness with hypotension: No Has patient had a PCN reaction causing severe rash involving mucus membranes or skin necrosis: No Has patient had a PCN reaction that required hospitalization: No Has patient had a PCN reaction occurring within the last 10 years: No If all of the above answers are "NO", then may proceed with Cephalosporin use.   . Adhesive [Tape] Rash  . Neosporin Original [Bacitracin-Neomycin-Polymyxin] Rash    Family History  Problem Relation Age of Onset  . Colon cancer Mother 60    Prior to Admission medications   Medication Sig Start Date End Date Taking? Authorizing Provider  carbamazepine (TEGRETOL XR) 200 MG 12 hr tablet  Take 200 mg by mouth 2 (two) times daily.  02/10/17   [provider]  carbamazepine (TEGRETOL XR) 400 MG 12 hr tablet Take 400 mg by mouth 2 (two) times daily. 05/19/17   [provider]  docusate sodium (COLACE) 100 MG capsule Take 1 capsule (100 mg total) by mouth 2 (two) times daily. 06/14/17   Reyne Dumas, MD  gabapentin (NEURONTIN) 800 MG tablet Take 800 mg by mouth 3 (three) times daily. 01/30/17   [provider]  levothyroxine (SYNTHROID,  LEVOTHROID) 50 MCG tablet Take 1 tablet by mouth daily before breakfast. 06/29/17   [provider]  linaclotide Rolan Lipa) 290 MCG CAPS capsule Take 1 capsule (290 mcg total) by mouth daily before breakfast. 03/28/17   Annitta Needs, NP  magnesium hydroxide (MILK OF MAGNESIA) 400 MG/5ML suspension Take 15 mLs by mouth 2 (two) times daily. 07/03/17 07/06/17  Davonna Belling, MD  Melatonin 10 MG TABS Take 1 tablet by mouth at bedtime.    [provider]  nicotine (NICODERM CQ - DOSED IN MG/24 HOURS) 14 mg/24hr patch Place 1 patch (14 mg total) onto the skin daily. 06/15/17   Reyne Dumas, MD  pantoprazole (PROTONIX) 40 MG tablet Take 1 tablet (40 mg total) by mouth 2 (two) times daily before a meal. 03/30/17   Carlis Stable, NP  QUEtiapine (SEROQUEL XR) 300 MG 24 hr tablet Take 300 mg by mouth at bedtime. 05/29/17   [provider]  sulfamethoxazole-trimethoprim (BACTRIM DS,SEPTRA DS) 800-160 MG tablet Take 1 tablet by mouth 2 (two) times daily. 06/21/17   [provider]  tamsulosin (FLOMAX) 0.4 MG CAPS capsule Take 1 capsule (0.4 mg total) by mouth daily after breakfast. 06/14/17   Reyne Dumas, MD  TRINTELLIX 20 MG TABS Take 20 mg by mouth daily. 02/10/17   [provider]    Physical Exam: Vitals:   07/06/17 1219 07/06/17 1220 07/06/17 1653 07/06/17 2008  BP: 120/73  117/81 118/80  Pulse: 94  80 92  Resp: 16  20 (!) 91  Temp: 98.6 F (37 C)     TempSrc: Oral     SpO2: 99%  100% 100%  Weight:  43.5 kg (96 lb)    Height:  4\' 9"  (1.448 m)        Constitutional: NAD, calm, comfortable Vitals:   07/06/17 1219 07/06/17 1220 07/06/17 1653 07/06/17 2008  BP: 120/73  117/81 118/80  Pulse: 94  80 92  Resp: 16  20 (!) 91  Temp: 98.6 F (37 C)     TempSrc: Oral     SpO2: 99%  100% 100%  Weight:  43.5 kg (96 lb)    Height:  4\' 9"  (1.448 m)     Eyes: PERRL, lids and conjunctivae normal ENMT: Mucous membranes are moist. Posterior pharynx clear of any  exudate or lesions.Normal dentition.  Neck: normal, supple, no masses, no thyromegaly Respiratory: clear to auscultation bilaterally, no wheezing, no crackles. Normal respiratory effort. No accessory muscle use.  Cardiovascular: Regular rate and rhythm, no murmurs / rubs / gallops. No extremity edema. 2+ pedal pulses. No carotid bruits.  Abdomen: no tenderness, no masses palpated. No hepatosplenomegaly. Bowel sounds positive.  Musculoskeletal: no clubbing / cyanosis. No joint deformity upper and lower extremities. Good ROM, no contractures. Normal muscle tone.  Skin: no rashes, lesions, ulcers. No induration Neurologic: CN 2-12 grossly intact. Sensation intact, DTR normal. Strength 5/5 in all 4.  Psychiatric: Normal judgment and insight. Alert and oriented  x 3. Normal mood.    Labs on Admission: I have personally reviewed following labs and imaging studies  CBC:  Recent Labs Lab 07/03/17 1442 07/06/17 1246  WBC 9.5 11.4*  NEUTROABS 5.5 9.0*  HGB 9.1* 9.7*  HCT 27.4* 29.6*  MCV 87.3 88.1  PLT 438* 932*   Basic Metabolic Panel:  Recent Labs Lab 07/03/17 1442 07/06/17 1246  NA 138 141  K 3.7 4.8  CL 105 102  CO2 26 25  GLUCOSE 110* 95  BUN 15 17  CREATININE 0.90 1.02*  CALCIUM 8.8* 9.2   GFR: Estimated Creatinine Clearance: 42 mL/min (A) (by C-G formula based on SCr of 1.02 mg/dL (H)). Liver Function Tests:  Recent Labs Lab 07/03/17 1442 07/06/17 1246  AST 44* 22  ALT 49 41  ALKPHOS 76 74  BILITOT 0.2* 1.4*  PROT 5.8* 6.6  ALBUMIN 2.6* 3.1*    Recent Labs Lab 07/03/17 1442 07/06/17 1246  LIPASE 78* 26    Urine analysis:    Component Value Date/Time   COLORURINE AMBER (A) 07/06/2017 1715   APPEARANCEUR HAZY (A) 07/06/2017 1715   LABSPEC 1.023 07/06/2017 1715   PHURINE 5.0 07/06/2017 1715   GLUCOSEU NEGATIVE 07/06/2017 1715   HGBUR LARGE (A) 07/06/2017 1715   BILIRUBINUR SMALL (A) 07/06/2017 1715   KETONESUR 80 (A) 07/06/2017 1715   PROTEINUR 100  (A) 07/06/2017 1715   UROBILINOGEN 1.0 12/11/2012 1744   NITRITE NEGATIVE 07/06/2017 1715   LEUKOCYTESUR NEGATIVE 07/06/2017 1715   Radiological Exams on Admission: US Ob Comp Less 14 Wks  Result Date: 07/06/2017 CLINICAL DATA:  Pregnant patient with 2 days of abdominal pain. EXAM: OBSTETRIC <14 WK [place] AND TRANSVAGINAL OB US DOPPLER ULTRASOUND OF OVARIES TECHNIQUE: Both transabdominal and transvaginal ultrasound examinations were performed for complete evaluation of the gestation as well as the maternal uterus, adnexal regions, and pelvic cul-de-sac. Transvaginal technique was performed to assess early pregnancy. Color and duplex Doppler ultrasound was utilized to evaluate blood flow to the ovaries. COMPARISON:  None. FINDINGS: Intrauterine gestational sac: None Maternal uterus/adnexae: The right ovary was not visualized. The left ovary is normal in appearance. Arterial and venous blood flow was seen in the left ovary. No free fluid in the pelvis. Pulsed Doppler evaluation of the left ovary demonstrates normal appearing low-resistance arterial and venous waveforms. IMPRESSION: 1. No IUP is identified. In the setting of pregnancy, this can represent early pregnancy, miscarriage, or ectopic pregnancy. Recommend clinical correlation and follow-up as clinically warranted. 2. The right ovary was not visualized. The left ovary is normal in appearance with arterial and venous blood flow seen in the left ovary. Electronically Signed   By: Dorise Bullion III M.D   On: 07/06/2017 19:47   US Ob Transvaginal  Result Date: 07/06/2017 CLINICAL DATA:  Pregnant patient with 2 days of abdominal pain. EXAM: OBSTETRIC <14 WK [place] AND TRANSVAGINAL OB US DOPPLER ULTRASOUND OF OVARIES TECHNIQUE: Both transabdominal and transvaginal ultrasound examinations were performed for complete evaluation of the gestation as well as the maternal uterus, adnexal regions, and pelvic cul-de-sac. Transvaginal technique was performed to assess  early pregnancy. Color and duplex Doppler ultrasound was utilized to evaluate blood flow to the ovaries. COMPARISON:  None. FINDINGS: Intrauterine gestational sac: None Maternal uterus/adnexae: The right ovary was not visualized. The left ovary is normal in appearance. Arterial and venous blood flow was seen in the left ovary. No free fluid in the pelvis. Pulsed Doppler evaluation of the left ovary demonstrates normal appearing low-resistance  arterial and venous waveforms. IMPRESSION: 1. No IUP is identified. In the setting of pregnancy, this can represent early pregnancy, miscarriage, or ectopic pregnancy. Recommend clinical correlation and follow-up as clinically warranted. 2. The right ovary was not visualized. The left ovary is normal in appearance with arterial and venous blood flow seen in the left ovary. Electronically Signed   By: Dorise Bullion III M.D   On: 07/06/2017 19:47   US Pelvic Doppler (torsion R/o Or Mass Arterial Flow)  Result Date: 07/06/2017 CLINICAL DATA:  Pregnant patient with 2 days of abdominal pain. EXAM: OBSTETRIC <14 WK [place] AND TRANSVAGINAL OB US DOPPLER ULTRASOUND OF OVARIES TECHNIQUE: Both transabdominal and transvaginal ultrasound examinations were performed for complete evaluation of the gestation as well as the maternal uterus, adnexal regions, and pelvic cul-de-sac. Transvaginal technique was performed to assess early pregnancy. Color and duplex Doppler ultrasound was utilized to evaluate blood flow to the ovaries. COMPARISON:  None. FINDINGS: Intrauterine gestational sac: None Maternal uterus/adnexae: The right ovary was not visualized. The left ovary is normal in appearance. Arterial and venous blood flow was seen in the left ovary. No free fluid in the pelvis. Pulsed Doppler evaluation of the left ovary demonstrates normal appearing low-resistance arterial and venous waveforms. IMPRESSION: 1. No IUP is identified. In the setting of pregnancy, this can represent early  pregnancy, miscarriage, or ectopic pregnancy. Recommend clinical correlation and follow-up as clinically warranted. 2. The right ovary was not visualized. The left ovary is normal in appearance with arterial and venous blood flow seen in the left ovary. Electronically Signed   By: Dorise Bullion III M.D   On: 07/06/2017 19:47     Assessment/Plan 46 yo female with n/v and chronic abdominal pain  Principal Problem:   Vomiting- prn phenergan.  Pepcid.  Carafate.  Seems to have resolved  Active Problems:   Bipolar 1 disorder ([place])- resume home meds   Substance abuse- uds only positive for barbituates, denies active drug abuse at this time   PUD (peptic ulcer disease)- pepcid   Chronic abdominal pain- noted   Pregnancy test positive- ob/gyn called, [place] neg.  False positive but will need repeat of this later in her cycle     DVT prophylaxis:  scds  Code Status:  full Family Communication:  none Disposition Plan:  Per day team Consults called:  none Admission status:  observation   Zeeva Courser A MD [facility]  If 7PM-7AM, please contact night-coverage [email or web address] Password [facility]  07/06/2017, 9:43 PM

## 2017-07-06 NOTE — ED Notes (Signed)
Patient continues to vomit brown emesis after given Reglan, unable to complete po fluid challenges.

## 2017-07-06 NOTE — ED Triage Notes (Addendum)
Patient brought in by son for generalized abdominal pain x2 weeks. Denies n/v/d. Seen here on 07/03/17 with no relief per patient. Last BM- unknown. Patient appears drowsy although easily arousable. Son reports patient has been this way since yesterday. Patient denies any other complaints.  No weakness or deficits noted.

## 2017-07-07 ENCOUNTER — Encounter (HOSPITAL_COMMUNITY): Payer: Self-pay | Admitting: *Deleted

## 2017-07-07 DIAGNOSIS — F319 Bipolar disorder, unspecified: Secondary | ICD-10-CM | POA: Diagnosis not present

## 2017-07-07 DIAGNOSIS — R112 Nausea with vomiting, unspecified: Secondary | ICD-10-CM | POA: Diagnosis not present

## 2017-07-07 DIAGNOSIS — R109 Unspecified abdominal pain: Secondary | ICD-10-CM | POA: Diagnosis not present

## 2017-07-07 DIAGNOSIS — G8929 Other chronic pain: Secondary | ICD-10-CM | POA: Diagnosis not present

## 2017-07-07 DIAGNOSIS — Z3201 Encounter for pregnancy test, result positive: Secondary | ICD-10-CM | POA: Diagnosis not present

## 2017-07-07 LAB — BASIC METABOLIC PANEL
Anion gap: 10 (ref 5–15)
BUN: 15 mg/dL (ref 6–20)
CHLORIDE: 105 mmol/L (ref 101–111)
CO2: 28 mmol/L (ref 22–32)
CREATININE: 0.94 mg/dL (ref 0.44–1.00)
Calcium: 8.6 mg/dL — ABNORMAL LOW (ref 8.9–10.3)
GFR calc Af Amer: >60 mL/min (ref >60)
GFR calc non Af Amer: >60 mL/min (ref >60)
Glucose, Bld: 91 mg/dL (ref 65–99)
Potassium: 3.6 mmol/L (ref 3.5–5.1)
SODIUM: 143 mmol/L (ref 135–145)

## 2017-07-07 MED ORDER — PROMETHAZINE HCL 12.5 MG PO TABS: 12.5000 mg | ORAL_TABLET | Freq: Four times a day (QID) | ORAL | 0 refills | Status: DC | PRN

## 2017-07-07 MED ORDER — CARBAMAZEPINE ER 100 MG PO TB12
ORAL_TABLET | ORAL | Status: AC
  Filled 2017-07-07: qty 2

## 2017-07-07 MED ORDER — SUCRALFATE 1 G PO TABS: 1.0000 g | ORAL_TABLET | Freq: Three times a day (TID) | ORAL | 0 refills | Status: DC

## 2017-07-07 MED ORDER — CARBAMAZEPINE ER 100 MG PO TB12
ORAL_TABLET | ORAL | Status: AC
  Filled 2017-07-07: qty 4

## 2017-07-07 NOTE — Progress Notes (Signed)
Patient is to be discharged home and in stable condition. Patient's IV removed, WNL. Patient given discharge instructions and verbalized understanding. Patient awaiting transportation and will be escorted out by staff via wheelchair upon arrival.  Celestia Khat, RN

## 2017-07-07 NOTE — Discharge Summary (Signed)
Physician Discharge Summary  Deanna Berry XBJ:[id number] DOB: [birth year] DOA: 07/06/2017  PCP: Redmond School, MD  Admit date: 07/06/2017 Discharge date: 07/07/2017  Admitted From:Home Disposition: Home  Recommendations for Outpatient Follow-up:  1. Follow up with PCP in 1 weeks 2. Please obtain BMP/CBC in one week 3. Patient will need repeat quantitative beta hCG in one week 4. Follow-up with GI for persistent abdominal pain/nausea vomiting symptoms   Discharge Condition: Stable CODE STATUS: Full Diet recommendation: Heart Healthy   Brief/Interim Summary: 46 year old female admitted to the hospital for intractable nausea and vomiting. She has a history of chronic abdominal pain followed by gastroenterology. She was treated with antiemetics and diet was advanced from liquids to solid. She appeared to tolerate this well. She is not having any new abdominal pain or is not tolerating solid food. No further vomiting. It is possible she had a gastroenteritis. She will be discharged with antiemetics to be taken at home as needed. Follow-up with gastroenterology for any recurrent symptoms.  In the emergency room she was found to have positive pregnancy test. Quantitative beta hCG was positive at 9 . this was reviewed with Dr. Glo Herring who did not further management was necessary at this time. Of note, she did have an ultrasound done in the emergency room that did not show any evidence of intrauterine pregnancy. It was recommended the patient have another quantitative beta hCG in one week which can be performed by her primary care physician. She will be continued on all her regular medications.  Discharge Diagnoses:  Principal Problem:   Vomiting Active Problems:   Bipolar 1 disorder (HCC)   Substance abuse   PUD (peptic ulcer disease)   Chronic abdominal pain   Pregnancy test positive    Discharge Instructions  Discharge Instructions    Diet - low sodium heart healthy    Complete by:  As  directed    Increase activity slowly    Complete by:  As directed      Allergies as of 07/07/2017      Reactions   Amoxil [amoxicillin] Hives   Has patient had a PCN reaction causing immediate rash, facial/tongue/throat swelling, SOB or lightheadedness with hypotension: No Has patient had a PCN reaction causing severe rash involving mucus membranes or skin necrosis: No Has patient had a PCN reaction that required hospitalization: No Has patient had a PCN reaction occurring within the last 10 years: No If all of the above answers are "NO", then may proceed with Cephalosporin use.   Adhesive [tape] Rash   Neosporin Original [bacitracin-neomycin-polymyxin] Rash      Medication List    STOP taking these medications   tamsulosin 0.4 MG Caps capsule Commonly known as:  FLOMAX     TAKE these medications   carbamazepine 200 MG 12 hr tablet Commonly known as:  TEGRETOL XR Take 200 mg by mouth 2 (two) times daily.   carbamazepine 400 MG 12 hr tablet Commonly known as:  TEGRETOL XR Take 400 mg by mouth 2 (two) times daily.   docusate sodium 100 MG capsule Commonly known as:  COLACE Take 1 capsule (100 mg total) by mouth 2 (two) times daily.   gabapentin 800 MG tablet Commonly known as:  NEURONTIN Take 800 mg by mouth 3 (three) times daily.   levothyroxine 50 MCG tablet Commonly known as:  SYNTHROID, LEVOTHROID Take 1 tablet by mouth daily before breakfast.   linaclotide 290 MCG Caps capsule Commonly known as:  LINZESS Take 1 capsule (290 mcg  total) by mouth daily before breakfast.   Melatonin 10 MG Tabs Take 1 tablet by mouth at bedtime.   nicotine 14 mg/24hr patch Commonly known as:  NICODERM CQ - dosed in mg/24 hours Place 1 patch (14 mg total) onto the skin daily.   pantoprazole 40 MG tablet Commonly known as:  PROTONIX Take 1 tablet (40 mg total) by mouth 2 (two) times daily before a meal.   promethazine 12.5 MG tablet Commonly known as:  PHENERGAN Take 1 tablet  (12.5 mg total) by mouth every 6 (six) hours as needed for nausea.   QUEtiapine 300 MG 24 hr tablet Commonly known as:  SEROQUEL XR Take 300 mg by mouth at bedtime.   sucralfate 1 g tablet Commonly known as:  CARAFATE Take 1 tablet (1 g total) by mouth 4 (four) times daily -  with meals and at bedtime.   TRINTELLIX 20 MG Tabs Generic drug:  vortioxetine HBr Take 20 mg by mouth daily.            Discharge Care Instructions        Start     Ordered   07/07/17 0000  sucralfate (CARAFATE) 1 g tablet  3 times daily with meals & bedtime     07/07/17 1718   07/07/17 0000  promethazine (PHENERGAN) 12.5 MG tablet  Every 6 hours PRN     07/07/17 1718   07/07/17 0000  Increase activity slowly     07/07/17 1718   07/07/17 0000  Diet - low sodium heart healthy     07/07/17 1718     Follow-up Information    Redmond School, MD. Schedule an appointment as soon as possible for a visit in 1 week(s).   Specialty:  Internal Medicine Why:  for repeat pregnancy test Contact information: [address] [place] [address] [phone]          Allergies  Allergen Reactions  . Amoxil [Amoxicillin] Hives    Has patient had a PCN reaction causing immediate rash, facial/tongue/throat swelling, SOB or lightheadedness with hypotension: No Has patient had a PCN reaction causing severe rash involving mucus membranes or skin necrosis: No Has patient had a PCN reaction that required hospitalization: No Has patient had a PCN reaction occurring within the last 10 years: No If all of the above answers are "NO", then may proceed with Cephalosporin use.   . Adhesive [Tape] Rash  . Neosporin Original [Bacitracin-Neomycin-Polymyxin] Rash    Consultations:     Procedures/Studies: US Ob Comp Less 14 Wks  Result Date: 07/06/2017 CLINICAL DATA:  Pregnant patient with 2 days of abdominal pain. EXAM: OBSTETRIC <14 WK [place] AND TRANSVAGINAL OB US DOPPLER ULTRASOUND OF OVARIES TECHNIQUE:  Both transabdominal and transvaginal ultrasound examinations were performed for complete evaluation of the gestation as well as the maternal uterus, adnexal regions, and pelvic cul-de-sac. Transvaginal technique was performed to assess early pregnancy. Color and duplex Doppler ultrasound was utilized to evaluate blood flow to the ovaries. COMPARISON:  None. FINDINGS: Intrauterine gestational sac: None Maternal uterus/adnexae: The right ovary was not visualized. The left ovary is normal in appearance. Arterial and venous blood flow was seen in the left ovary. No free fluid in the pelvis. Pulsed Doppler evaluation of the left ovary demonstrates normal appearing low-resistance arterial and venous waveforms. IMPRESSION: 1. No IUP is identified. In the setting of pregnancy, this can represent early pregnancy, miscarriage, or ectopic pregnancy. Recommend clinical correlation and follow-up as clinically warranted. 2. The right ovary was  not visualized. The left ovary is normal in appearance with arterial and venous blood flow seen in the left ovary. Electronically Signed   By: Dorise Bullion III M.D   On: 07/06/2017 19:47   US Ob Transvaginal  Result Date: 07/06/2017 CLINICAL DATA:  Pregnant patient with 2 days of abdominal pain. EXAM: OBSTETRIC <14 WK [place] AND TRANSVAGINAL OB US DOPPLER ULTRASOUND OF OVARIES TECHNIQUE: Both transabdominal and transvaginal ultrasound examinations were performed for complete evaluation of the gestation as well as the maternal uterus, adnexal regions, and pelvic cul-de-sac. Transvaginal technique was performed to assess early pregnancy. Color and duplex Doppler ultrasound was utilized to evaluate blood flow to the ovaries. COMPARISON:  None. FINDINGS: Intrauterine gestational sac: None Maternal uterus/adnexae: The right ovary was not visualized. The left ovary is normal in appearance. Arterial and venous blood flow was seen in the left ovary. No free fluid in the pelvis. Pulsed Doppler  evaluation of the left ovary demonstrates normal appearing low-resistance arterial and venous waveforms. IMPRESSION: 1. No IUP is identified. In the setting of pregnancy, this can represent early pregnancy, miscarriage, or ectopic pregnancy. Recommend clinical correlation and follow-up as clinically warranted. 2. The right ovary was not visualized. The left ovary is normal in appearance with arterial and venous blood flow seen in the left ovary. Electronically Signed   By: Dorise Bullion III M.D   On: 07/06/2017 19:47   US Pelvic Doppler (torsion R/o Or Mass Arterial Flow)  Result Date: 07/06/2017 CLINICAL DATA:  Pregnant patient with 2 days of abdominal pain. EXAM: OBSTETRIC <14 WK [place] AND TRANSVAGINAL OB US DOPPLER ULTRASOUND OF OVARIES TECHNIQUE: Both transabdominal and transvaginal ultrasound examinations were performed for complete evaluation of the gestation as well as the maternal uterus, adnexal regions, and pelvic cul-de-sac. Transvaginal technique was performed to assess early pregnancy. Color and duplex Doppler ultrasound was utilized to evaluate blood flow to the ovaries. COMPARISON:  None. FINDINGS: Intrauterine gestational sac: None Maternal uterus/adnexae: The right ovary was not visualized. The left ovary is normal in appearance. Arterial and venous blood flow was seen in the left ovary. No free fluid in the pelvis. Pulsed Doppler evaluation of the left ovary demonstrates normal appearing low-resistance arterial and venous waveforms. IMPRESSION: 1. No IUP is identified. In the setting of pregnancy, this can represent early pregnancy, miscarriage, or ectopic pregnancy. Recommend clinical correlation and follow-up as clinically warranted. 2. The right ovary was not visualized. The left ovary is normal in appearance with arterial and venous blood flow seen in the left ovary. Electronically Signed   By: Dorise Bullion III M.D   On: 07/06/2017 19:47   Dg Abdomen Acute W/chest  Result Date:  07/03/2017 CLINICAL DATA:  Nausea vomiting and weakness EXAM: DG ABDOMEN ACUTE W/ 1V CHEST COMPARISON:  Ultrasound 06/14/2017, CT 06/13/2017, chest radiograph 03/21/2017 FINDINGS: Single-view chest demonstrates a small irregular opacity in the right upper lobe. No pleural effusion. Normal cardiomediastinal silhouette. Supine and upright views of the abdomen demonstrate no free air beneath the diaphragm. Gaseous prominence of right mid abdominal small bowel with fluid levels but distal gas demonstrated. Moderate stool in the colon. Calcified phleboliths in the pelvis. IMPRESSION: 1. Small focus of irregular opacity in the right upper lobe, cannot exclude a small focus of inflammatory process or infiltrate 2. Gaseous prominence of right mid abdominal small bowel loops with fluid levels but overall nonobstructed gas pattern. Localized ileus could be considered. Electronically Signed   By: Donavan Foil M.D.   On: 07/03/2017  16:03   Ct Renal Stone Study  Result Date: 06/13/2017 CLINICAL DATA:  Hematuria. Upper abdominal and bilateral flank pain. EXAM: CT ABDOMEN AND PELVIS WITHOUT CONTRAST TECHNIQUE: Multidetector CT imaging of the abdomen and pelvis was performed following the standard protocol without IV contrast. COMPARISON:  Noncontrast CT yesterday. FINDINGS: Lower chest: Chronic scarring in the right lung base. Probable basilar emphysema. Hepatobiliary: No focal hepatic lesion on noncontrast exam. Unchanged gallbladder distention without pericholecystic inflammation. No calcified gallstone. No common bile duct dilatation. Pancreas: No ductal dilatation or inflammation. Spleen: Normal in size without focal abnormality. Adrenals/Urinary Tract: No adrenal nodule. Persistent but decreased fullness of the right renal collecting system with question of right urothelial thickening. Layering high attenuation material in the lower pole calyx on prior exam is not seen. Resolved left renal collecting system fullness and  hyper attenuation in the lower left kidney. The previous high density in the distal left ureter is not definitively seen. No discrete urolithiasis. No perinephric edema. Urinary bladder is physiologically distended. No bladder wall thickening. Stomach/Bowel: Stomach is distended with ingested material. No bowel obstruction or wall thickening. Large colonic stool burden throughout the entire colon, similar to prior exam. There is colonic tortuosity. Normal appendix. Vascular/Lymphatic: Minimal aortic atherosclerosis without aneurysm. Prominent periaortic nodes appear similar to exam yesterday. No progressive adenopathy. Reproductive: Uterus and bilateral adnexa are unremarkable. Other: No free air, free fluid, or intra-abdominal fluid collection. Musculoskeletal: There are no acute or suspicious osseous abnormalities. IMPRESSION: 1. Decreased fullness of both renal collecting systems from exam yesterday. Previous high attenuation material in bilateral lower pole collecting systems has resolved. 2. Previous high density in the distal left ureter has resolved, there is been passage of stone or clearing of hemorrhage or debris. 3. Chronic findings include stable gallbladder distention, stable large colonic stool burden, and aortic atherosclerosis. Electronically Signed   By: Jeb Levering M.D.   On: 06/13/2017 20:17   Ct Renal Stone Study  Result Date: 06/12/2017 CLINICAL DATA:  Upper abdominal pain radiates into the flank area for 2 weeks. Hematuria. EXAM: CT ABDOMEN AND PELVIS WITHOUT CONTRAST TECHNIQUE: Multidetector CT imaging of the abdomen and pelvis was performed following the standard protocol without IV contrast. COMPARISON:  02/23/2017.  03/26/2015. FINDINGS: Lower chest: 8 mm cavitary lesion posterior right costophrenic sulcus unchanged since 03/26/2015 compatible with benign etiology. Hepatobiliary: No focal abnormality in the liver on this study without intravenous contrast. Gallbladder is markedly  distended measuring 5.6 cm in diameter. No intrahepatic or extrahepatic biliary dilation. Pancreas: No focal mass lesion. No dilatation of the main duct. No intraparenchymal cyst. No peripancreatic edema. Spleen: No splenomegaly. No focal mass lesion. Adrenals/Urinary Tract: No adrenal nodule or mass. Mild fullness of the right intrarenal collecting system is borderline for mild hydronephrosis. There is some layering high attenuation material in a lower pole calyx. Ureter is upper normal diameter throughout. Similar mild fullness noted in the left kidney with layering high attenuation material in the lower pole calyx of this kidney is well. Although left ureter is less distended than on the right, there is a focal area of high density in the distal left ureter (image 74 series 2 and image 48 series 5) that is probably a tiny distal left ureteral stone. The urinary bladder appears normal for the degree of distention.  ) Stomach/Bowel: Stomach is nondistended. No gastric wall thickening. No evidence of outlet obstruction. Duodenum is normally positioned as is the ligament of Treitz. No small bowel wall thickening. No small bowel dilatation.  The terminal ileum is normal. The appendix is normal. Slightly increased attenuation of colonic contents may be trace residua from the upper GI series of 05/18/2017. Vascular/Lymphatic: There is abdominal aortic atherosclerosis without aneurysm. Upper normal lymph nodes are seen in the para-aortic space. No pelvic sidewall lymphadenopathy. Reproductive: The uterus has normal CT imaging appearance. There is no adnexal mass. Other: No intraperitoneal free fluid. Musculoskeletal: Bone windows reveal no worrisome lytic or sclerotic osseous lesions. IMPRESSION: 1. Mild fullness of both intrarenal collecting systems, right slightly more than left with associated mild right ureteral fullness. There is high attenuation material and lower pole collecting systems of each kidney which may  represent hemorrhage or infectious debris. These do not appear quite focal enough to represent stones. 2. Tiny focus of high attenuation in the distal left ureter likely a small stone although a focal collection of hemorrhage or debris is possible. 3. Marked gallbladder distention without associated biliary dilatation. Electronically Signed   By: Misty Stanley M.D.   On: 06/12/2017 16:23   US Abdomen Limited Ruq  Result Date: 06/14/2017 CLINICAL DATA:  Abdominal pain EXAM: ULTRASOUND ABDOMEN LIMITED RIGHT UPPER QUADRANT COMPARISON:  CT 06/13/2017 FINDINGS: Gallbladder: Mild distention of the gallbladder. No visible stones or wall thickening. Negative sonographic Murphy's. Common bile duct: Diameter: Normal caliber, 4 mm Liver: No focal lesion identified. Within normal limits in parenchymal echogenicity. IMPRESSION: Gallbladder is mildly distended, but no visible stones or wall thickening. No acute findings. Electronically Signed   By: Rolm Baptise M.D.   On: 06/14/2017 09:32        Subjective: Doing better. Tolerating diet. No further vomiting.   Discharge Exam: Vitals:   07/07/17 0442 07/07/17 1252  BP: 99/70 97/60  Pulse: 88 83  Resp: 18 18  Temp: 98.9 F (37.2 C) 98.5 F (36.9 C)  SpO2: 99% 100%   Vitals:   07/06/17 2008 07/06/17 2217 07/07/17 0442 07/07/17 1252  BP: 118/80 114/75 99/70 97/60   Pulse: 92 93 88 83  Resp: (!) 91 18 18 18   Temp:  98.4 F (36.9 C) 98.9 F (37.2 C) 98.5 F (36.9 C)  TempSrc:  Oral Oral Oral  SpO2: 100% 97% 99% 100%  Weight:  42.5 kg (93 lb 9.6 oz) 43.7 kg (96 lb 4.8 oz)   Height:  4\' 11"  (1.499 m)      General: Pt is alert, awake, not in acute distress Cardiovascular: RRR, S1/S2 +, no rubs, no gallops Respiratory: CTA bilaterally, no wheezing, no rhonchi Abdominal: Soft, NT, ND, bowel sounds + Extremities: no edema, no cyanosis    The results of significant diagnostics from this hospitalization (including imaging, microbiology, ancillary  and laboratory) are listed below for reference.     Microbiology: No results found for this or any previous visit (from the past 240 hour(s)).   Labs: BNP (last 3 results) No results for input(s): BNP in the last 8760 hours. Basic Metabolic Panel:  Recent Labs Lab 07/03/17 1442 07/06/17 1246 07/07/17 0422  NA 138 141 143  K 3.7 4.8 3.6  CL 105 102 105  CO2 26 25 28   GLUCOSE 110* 95 91  BUN 15 17 15   CREATININE 0.90 1.02* 0.94  CALCIUM 8.8* 9.2 8.6*   Liver Function Tests:  Recent Labs Lab 07/03/17 1442 07/06/17 1246  AST 44* 22  ALT 49 41  ALKPHOS 76 74  BILITOT 0.2* 1.4*  PROT 5.8* 6.6  ALBUMIN 2.6* 3.1*    Recent Labs Lab 07/03/17 1442 07/06/17 1246  LIPASE 78* 26   No results for input(s): AMMONIA in the last 168 hours. CBC:  Recent Labs Lab 07/03/17 1442 07/06/17 1246  WBC 9.5 11.4*  NEUTROABS 5.5 9.0*  HGB 9.1* 9.7*  HCT 27.4* 29.6*  MCV 87.3 88.1  PLT 438* 629*   Cardiac Enzymes: No results for input(s): CKTOTAL, CKMB, CKMBINDEX, TROPONINI in the last 168 hours. BNP: Invalid input(s): POCBNP CBG: No results for input(s): GLUCAP in the last 168 hours. D-Dimer No results for input(s): DDIMER in the last 72 hours. Hgb A1c No results for input(s): HGBA1C in the last 72 hours. Lipid Profile No results for input(s): CHOL, HDL, LDLCALC, TRIG, CHOLHDL, LDLDIRECT in the last 72 hours. Thyroid function studies No results for input(s): TSH, T4TOTAL, T3FREE, THYROIDAB in the last 72 hours.  Invalid input(s): FREET3 Anemia work up No results for input(s): VITAMINB12, FOLATE, FERRITIN, TIBC, IRON, RETICCTPCT in the last 72 hours. Urinalysis    Component Value Date/Time   COLORURINE AMBER (A) 07/06/2017 1715   APPEARANCEUR HAZY (A) 07/06/2017 1715   LABSPEC 1.023 07/06/2017 1715   PHURINE 5.0 07/06/2017 1715   GLUCOSEU NEGATIVE 07/06/2017 1715   HGBUR LARGE (A) 07/06/2017 1715   BILIRUBINUR SMALL (A) 07/06/2017 1715   KETONESUR 80 (A)  07/06/2017 1715   PROTEINUR 100 (A) 07/06/2017 1715   UROBILINOGEN 1.0 12/11/2012 1744   NITRITE NEGATIVE 07/06/2017 1715   LEUKOCYTESUR NEGATIVE 07/06/2017 1715   Sepsis Labs Invalid input(s): PROCALCITONIN,  WBC,  LACTICIDVEN Microbiology No results found for this or any previous visit (from the past 240 hour(s)).   Time coordinating discharge: Over 30 minutes  SIGNED:   Kathie Dike, MD  [facility] 07/07/2017, 7:51 PM Pager   If 7PM-7AM, please contact night-coverage [email or web address] Password [email or web address]

## 2017-07-13 ENCOUNTER — Other Ambulatory Visit: Payer: Self-pay

## 2017-07-13 DIAGNOSIS — K311 Adult hypertrophic pyloric stenosis: Secondary | ICD-10-CM

## 2017-07-13 NOTE — Telephone Encounter (Signed)
Called pt. EGD/Dil with Propofol with [facility] scheduled for 08/15/17 at 7:30am. Cancelled OV for 07/2417 ([facility] advised to contact her for EGD/Dil). She is on recall for OV in 6 months already. Updated her mailing address. Will mail instructions after pre-op is scheduled.

## 2017-07-13 NOTE — Telephone Encounter (Signed)
Called and informed pt of pre-op appt 08/10/17 at 10:00am. Letter mailed with procedure instructions.

## 2017-07-21 DIAGNOSIS — F314 Bipolar disorder, current episode depressed, severe, without psychotic features: Secondary | ICD-10-CM | POA: Diagnosis not present

## 2017-07-30 DIAGNOSIS — D649 Anemia, unspecified: Secondary | ICD-10-CM | POA: Diagnosis not present

## 2017-07-30 DIAGNOSIS — K920 Hematemesis: Secondary | ICD-10-CM | POA: Diagnosis not present

## 2017-07-30 DIAGNOSIS — K859 Acute pancreatitis without necrosis or infection, unspecified: Secondary | ICD-10-CM | POA: Diagnosis not present

## 2017-07-30 DIAGNOSIS — K274 Chronic or unspecified peptic ulcer, site unspecified, with hemorrhage: Secondary | ICD-10-CM | POA: Diagnosis not present

## 2017-07-30 DIAGNOSIS — F17200 Nicotine dependence, unspecified, uncomplicated: Secondary | ICD-10-CM | POA: Diagnosis not present

## 2017-07-30 DIAGNOSIS — R1111 Vomiting without nausea: Secondary | ICD-10-CM | POA: Diagnosis not present

## 2017-07-30 DIAGNOSIS — F319 Bipolar disorder, unspecified: Secondary | ICD-10-CM | POA: Diagnosis not present

## 2017-07-31 ENCOUNTER — Inpatient Hospital Stay (HOSPITAL_COMMUNITY)
Admission: AD | Admit: 2017-07-31 | Discharge: 2017-08-05 | DRG: 438 | Disposition: A | Discharge status: Home / Self Care | Payer: BLUE CROSS/BLUE SHIELD | Source: Other Acute Inpatient Hospital | Attending: Internal Medicine | Admitting: Internal Medicine

## 2017-07-31 ENCOUNTER — Encounter (HOSPITAL_COMMUNITY): Payer: Self-pay | Admitting: Family Medicine

## 2017-07-31 ENCOUNTER — Inpatient Hospital Stay (HOSPITAL_COMMUNITY): Payer: BLUE CROSS/BLUE SHIELD

## 2017-07-31 DIAGNOSIS — D72829 Elevated white blood cell count, unspecified: Secondary | ICD-10-CM | POA: Diagnosis present

## 2017-07-31 DIAGNOSIS — K254 Chronic or unspecified gastric ulcer with hemorrhage: Secondary | ICD-10-CM | POA: Diagnosis not present

## 2017-07-31 DIAGNOSIS — F17200 Nicotine dependence, unspecified, uncomplicated: Secondary | ICD-10-CM | POA: Diagnosis present

## 2017-07-31 DIAGNOSIS — R319 Hematuria, unspecified: Secondary | ICD-10-CM | POA: Diagnosis present

## 2017-07-31 DIAGNOSIS — R7989 Other specified abnormal findings of blood chemistry: Secondary | ICD-10-CM | POA: Diagnosis present

## 2017-07-31 DIAGNOSIS — R112 Nausea with vomiting, unspecified: Secondary | ICD-10-CM

## 2017-07-31 DIAGNOSIS — Z743 Need for continuous supervision: Secondary | ICD-10-CM | POA: Diagnosis not present

## 2017-07-31 DIAGNOSIS — Y92002 Bathroom of unspecified non-institutional (private) residence single-family (private) house as the place of occurrence of the external cause: Secondary | ICD-10-CM | POA: Diagnosis not present

## 2017-07-31 DIAGNOSIS — D62 Acute posthemorrhagic anemia: Secondary | ICD-10-CM | POA: Diagnosis not present

## 2017-07-31 DIAGNOSIS — K279 Peptic ulcer, site unspecified, unspecified as acute or chronic, without hemorrhage or perforation: Secondary | ICD-10-CM | POA: Diagnosis not present

## 2017-07-31 DIAGNOSIS — W1830XA Fall on same level, unspecified, initial encounter: Secondary | ICD-10-CM | POA: Diagnosis present

## 2017-07-31 DIAGNOSIS — W19XXXA Unspecified fall, initial encounter: Secondary | ICD-10-CM

## 2017-07-31 DIAGNOSIS — K311 Adult hypertrophic pyloric stenosis: Secondary | ICD-10-CM

## 2017-07-31 DIAGNOSIS — N390 Urinary tract infection, site not specified: Secondary | ICD-10-CM | POA: Diagnosis present

## 2017-07-31 DIAGNOSIS — Z87730 Personal history of (corrected) cleft lip and palate: Secondary | ICD-10-CM

## 2017-07-31 DIAGNOSIS — D500 Iron deficiency anemia secondary to blood loss (chronic): Secondary | ICD-10-CM | POA: Diagnosis not present

## 2017-07-31 DIAGNOSIS — K920 Hematemesis: Secondary | ICD-10-CM

## 2017-07-31 DIAGNOSIS — K8502 Idiopathic acute pancreatitis with infected necrosis: Secondary | ICD-10-CM | POA: Diagnosis not present

## 2017-07-31 DIAGNOSIS — K449 Diaphragmatic hernia without obstruction or gangrene: Secondary | ICD-10-CM | POA: Diagnosis not present

## 2017-07-31 DIAGNOSIS — F1910 Other psychoactive substance abuse, uncomplicated: Secondary | ICD-10-CM | POA: Diagnosis not present

## 2017-07-31 DIAGNOSIS — R1013 Epigastric pain: Secondary | ICD-10-CM | POA: Diagnosis not present

## 2017-07-31 DIAGNOSIS — B179 Acute viral hepatitis, unspecified: Secondary | ICD-10-CM | POA: Diagnosis present

## 2017-07-31 DIAGNOSIS — Z888 Allergy status to other drugs, medicaments and biological substances status: Secondary | ICD-10-CM | POA: Diagnosis not present

## 2017-07-31 DIAGNOSIS — E039 Hypothyroidism, unspecified: Secondary | ICD-10-CM | POA: Diagnosis not present

## 2017-07-31 DIAGNOSIS — N026 Recurrent and persistent hematuria with dense deposit disease: Secondary | ICD-10-CM

## 2017-07-31 DIAGNOSIS — R748 Abnormal levels of other serum enzymes: Secondary | ICD-10-CM | POA: Diagnosis present

## 2017-07-31 DIAGNOSIS — Z91048 Other nonmedicinal substance allergy status: Secondary | ICD-10-CM | POA: Diagnosis not present

## 2017-07-31 DIAGNOSIS — N170 Acute kidney failure with tubular necrosis: Secondary | ICD-10-CM | POA: Diagnosis present

## 2017-07-31 DIAGNOSIS — Y92009 Unspecified place in unspecified non-institutional (private) residence as the place of occurrence of the external cause: Secondary | ICD-10-CM

## 2017-07-31 DIAGNOSIS — F1110 Opioid abuse, uncomplicated: Secondary | ICD-10-CM | POA: Diagnosis present

## 2017-07-31 DIAGNOSIS — W19XXXD Unspecified fall, subsequent encounter: Secondary | ICD-10-CM | POA: Diagnosis not present

## 2017-07-31 DIAGNOSIS — K922 Gastrointestinal hemorrhage, unspecified: Secondary | ICD-10-CM | POA: Diagnosis present

## 2017-07-31 DIAGNOSIS — F319 Bipolar disorder, unspecified: Secondary | ICD-10-CM | POA: Diagnosis present

## 2017-07-31 DIAGNOSIS — K259 Gastric ulcer, unspecified as acute or chronic, without hemorrhage or perforation: Secondary | ICD-10-CM | POA: Diagnosis not present

## 2017-07-31 DIAGNOSIS — K921 Melena: Secondary | ICD-10-CM | POA: Diagnosis not present

## 2017-07-31 DIAGNOSIS — R1111 Vomiting without nausea: Secondary | ICD-10-CM | POA: Diagnosis not present

## 2017-07-31 DIAGNOSIS — R279 Unspecified lack of coordination: Secondary | ICD-10-CM | POA: Diagnosis not present

## 2017-07-31 DIAGNOSIS — Z79899 Other long term (current) drug therapy: Secondary | ICD-10-CM | POA: Diagnosis not present

## 2017-07-31 DIAGNOSIS — K274 Chronic or unspecified peptic ulcer, site unspecified, with hemorrhage: Secondary | ICD-10-CM | POA: Diagnosis not present

## 2017-07-31 DIAGNOSIS — F17210 Nicotine dependence, cigarettes, uncomplicated: Secondary | ICD-10-CM | POA: Diagnosis not present

## 2017-07-31 DIAGNOSIS — D649 Anemia, unspecified: Secondary | ICD-10-CM | POA: Diagnosis not present

## 2017-07-31 DIAGNOSIS — K859 Acute pancreatitis without necrosis or infection, unspecified: Principal | ICD-10-CM

## 2017-07-31 DIAGNOSIS — K8500 Idiopathic acute pancreatitis without necrosis or infection: Secondary | ICD-10-CM | POA: Diagnosis not present

## 2017-07-31 DIAGNOSIS — R1110 Vomiting, unspecified: Secondary | ICD-10-CM | POA: Diagnosis present

## 2017-07-31 LAB — COMPREHENSIVE METABOLIC PANEL
ALBUMIN: 2.7 g/dL — AB (ref 3.5–5.0)
ALK PHOS: 163 U/L — AB (ref 38–126)
ALT: 664 U/L — AB (ref 14–54)
AST: 128 U/L — AB (ref 15–41)
Anion gap: 13 (ref 5–15)
BILIRUBIN TOTAL: 2.6 mg/dL — AB (ref 0.3–1.2)
BUN: 85 mg/dL — AB (ref 6–20)
CALCIUM: 8.3 mg/dL — AB (ref 8.9–10.3)
CO2: 18 mmol/L — ABNORMAL LOW (ref 22–32)
CREATININE: 1.72 mg/dL — AB (ref 0.44–1.00)
Chloride: 114 mmol/L — ABNORMAL HIGH (ref 101–111)
GFR calc Af Amer: 40 mL/min — ABNORMAL LOW (ref >60)
GFR, EST NON AFRICAN AMERICAN: 35 mL/min — AB (ref >60)
GLUCOSE: 105 mg/dL — AB (ref 65–99)
Potassium: 3.2 mmol/L — ABNORMAL LOW (ref 3.5–5.1)
Sodium: 145 mmol/L (ref 135–145)
TOTAL PROTEIN: 6.1 g/dL — AB (ref 6.5–8.1)

## 2017-07-31 LAB — PROTIME-INR
INR: 2.01
PROTHROMBIN TIME: 22.6 s — AB (ref 11.4–15.2)

## 2017-07-31 LAB — URINALYSIS, ROUTINE W REFLEX MICROSCOPIC
Bilirubin Urine: NEGATIVE
GLUCOSE, UA: NEGATIVE mg/dL
HGB URINE DIPSTICK: NEGATIVE
Ketones, ur: NEGATIVE mg/dL
NITRITE: NEGATIVE
PROTEIN: NEGATIVE mg/dL
SPECIFIC GRAVITY, URINE: 1.015 (ref 1.005–1.030)
pH: 5 (ref 5.0–8.0)

## 2017-07-31 LAB — MRSA PCR SCREENING: MRSA by PCR: NEGATIVE

## 2017-07-31 LAB — LIPID PANEL
CHOL/HDL RATIO: 12 ratio
Cholesterol: 144 mg/dL (ref 0–200)
HDL: 12 mg/dL — AB (ref >40)
LDL Cholesterol: 60 mg/dL (ref 0–99)
Triglycerides: 361 mg/dL — ABNORMAL HIGH (ref <150)
VLDL: 72 mg/dL — AB (ref 0–40)

## 2017-07-31 LAB — CBC
HEMATOCRIT: 29.3 % — AB (ref 36.0–46.0)
HEMOGLOBIN: 9.9 g/dL — AB (ref 12.0–15.0)
MCH: 28.6 pg (ref 26.0–34.0)
MCHC: 33.8 g/dL (ref 30.0–36.0)
MCV: 84.7 fL (ref 78.0–100.0)
Platelets: 261 10*3/uL (ref 150–400)
RBC: 3.46 MIL/uL — AB (ref 3.87–5.11)
RDW: 17.2 % — ABNORMAL HIGH (ref 11.5–15.5)
WBC: 21.5 10*3/uL — AB (ref 4.0–10.5)

## 2017-07-31 LAB — ETHANOL: Alcohol, Ethyl (B): <10 mg/dL (ref <10)

## 2017-07-31 LAB — LACTIC ACID, PLASMA: Lactic Acid, Venous: 1.5 mmol/L (ref 0.5–1.9)

## 2017-07-31 LAB — AMMONIA: Ammonia: 18 umol/L (ref 9–35)

## 2017-07-31 LAB — ACETAMINOPHEN LEVEL

## 2017-07-31 LAB — T4, FREE: Free T4: 0.82 ng/dL (ref 0.61–1.12)

## 2017-07-31 LAB — HEMOGLOBIN A1C
Hgb A1c MFr Bld: 5.1 % (ref 4.8–5.6)
MEAN PLASMA GLUCOSE: 99.67 mg/dL

## 2017-07-31 LAB — PREGNANCY, URINE: PREG TEST UR: NEGATIVE

## 2017-07-31 LAB — CK: Total CK: 425 U/L — ABNORMAL HIGH (ref 38–234)

## 2017-07-31 LAB — LIPASE, BLOOD: LIPASE: 165 U/L — AB (ref 11–51)

## 2017-07-31 LAB — TSH: TSH: 0.056 u[IU]/mL — ABNORMAL LOW (ref 0.350–4.500)

## 2017-07-31 MED ORDER — ALBUTEROL SULFATE (2.5 MG/3ML) 0.083% IN NEBU: 2.5000 mg | INHALATION_SOLUTION | Freq: Four times a day (QID) | RESPIRATORY_TRACT | Status: DC | PRN

## 2017-07-31 MED ORDER — PANTOPRAZOLE SODIUM 40 MG IV SOLR: 40.0000 mg | Freq: Two times a day (BID) | INTRAVENOUS | Status: DC

## 2017-07-31 MED ORDER — ACETYLCYSTEINE 20 % IN SOLN
140.0000 mg/kg | Freq: Once | RESPIRATORY_TRACT | Status: DC
  Filled 2017-07-31: qty 60

## 2017-07-31 MED ORDER — HYDROMORPHONE HCL 1 MG/ML IJ SOLN
1.0000 mg | INTRAMUSCULAR | Status: DC | PRN
  Administered 2017-07-31 – 2017-08-01 (×4): 1 mg via INTRAVENOUS
  Filled 2017-07-31 (×4): qty 1

## 2017-07-31 MED ORDER — VITAMIN K1 10 MG/ML IJ SOLN
INTRAMUSCULAR | Status: AC
  Filled 2017-07-31: qty 1

## 2017-07-31 MED ORDER — ONDANSETRON HCL 4 MG/2ML IJ SOLN
4.0000 mg | Freq: Four times a day (QID) | INTRAMUSCULAR | Status: DC | PRN
  Administered 2017-07-31: 4 mg via INTRAVENOUS
  Filled 2017-07-31: qty 2

## 2017-07-31 MED ORDER — DOCUSATE SODIUM 100 MG PO CAPS: 100.0000 mg | ORAL_CAPSULE | Freq: Two times a day (BID) | ORAL | Status: DC | PRN

## 2017-07-31 MED ORDER — POTASSIUM CHLORIDE 2 MEQ/ML IV SOLN
INTRAVENOUS | Status: DC
  Filled 2017-07-31 (×3): qty 1000

## 2017-07-31 MED ORDER — SENNA 8.6 MG PO TABS
1.0000 | ORAL_TABLET | Freq: Two times a day (BID) | ORAL | Status: DC
  Administered 2017-07-31: 8.6 mg via ORAL
  Filled 2017-07-31: qty 1

## 2017-07-31 MED ORDER — SODIUM CHLORIDE 0.9 % IV SOLN
8.0000 mg/h | INTRAVENOUS | Status: DC
  Administered 2017-07-31: 8 mg/h via INTRAVENOUS
  Filled 2017-07-31 (×3): qty 80

## 2017-07-31 MED ORDER — IPRATROPIUM BROMIDE 0.02 % IN SOLN: 0.5000 mg | Freq: Four times a day (QID) | RESPIRATORY_TRACT | Status: DC | PRN

## 2017-07-31 MED ORDER — ONDANSETRON HCL 4 MG PO TABS
4.0000 mg | ORAL_TABLET | Freq: Four times a day (QID) | ORAL | Status: DC | PRN
Start: 2017-07-31 — End: 2017-08-05

## 2017-07-31 MED ORDER — QUETIAPINE FUMARATE ER 50 MG PO TB24
300.0000 mg | ORAL_TABLET | Freq: Every day | ORAL | Status: DC
  Administered 2017-07-31 – 2017-08-04 (×5): 300 mg via ORAL
  Filled 2017-07-31 (×2): qty 1
  Filled 2017-07-31 (×2): qty 6
  Filled 2017-07-31 (×2): qty 1

## 2017-07-31 MED ORDER — LEVOTHYROXINE SODIUM 50 MCG PO TABS
50.0000 ug | ORAL_TABLET | Freq: Every day | ORAL | Status: DC
  Administered 2017-08-01 – 2017-08-05 (×5): 50 ug via ORAL
  Filled 2017-07-31 (×3): qty 1
  Filled 2017-07-31 (×2): qty 2

## 2017-07-31 MED ORDER — POTASSIUM CHLORIDE 2 MEQ/ML IV SOLN
INTRAVENOUS | Status: DC
  Administered 2017-08-01 – 2017-08-04 (×7): via INTRAVENOUS
  Filled 2017-07-31 (×23): qty 1000

## 2017-07-31 MED ORDER — ACETYLCYSTEINE 20 % IN SOLN
70.0000 mg/kg | RESPIRATORY_TRACT | Status: DC
  Filled 2017-07-31 (×12): qty 30

## 2017-07-31 MED ORDER — SENNA 8.6 MG PO TABS: 1.0000 | ORAL_TABLET | Freq: Two times a day (BID) | ORAL | Status: DC | PRN

## 2017-07-31 MED ORDER — DEXTROSE 5 % IV SOLN
1.0000 g | INTRAVENOUS | Status: DC
  Administered 2017-07-31 – 2017-08-04 (×5): 1 g via INTRAVENOUS
  Filled 2017-07-31 (×9): qty 10

## 2017-07-31 MED ORDER — SODIUM CHLORIDE 0.9 % IV SOLN
INTRAVENOUS | Status: DC
  Administered 2017-07-31: 12:00:00 via INTRAVENOUS

## 2017-07-31 MED ORDER — IPRATROPIUM-ALBUTEROL 0.5-2.5 (3) MG/3ML IN SOLN: 3.0000 mL | Freq: Four times a day (QID) | RESPIRATORY_TRACT | Status: DC | PRN

## 2017-07-31 MED ORDER — SODIUM CHLORIDE 0.9 % IV SOLN
80.0000 mg | Freq: Once | INTRAVENOUS | Status: AC
  Administered 2017-07-31: 80 mg via INTRAVENOUS
  Filled 2017-07-31: qty 80

## 2017-07-31 MED ORDER — DOCUSATE SODIUM 100 MG PO CAPS
100.0000 mg | ORAL_CAPSULE | Freq: Two times a day (BID) | ORAL | Status: DC
  Administered 2017-07-31: 100 mg via ORAL
  Filled 2017-07-31: qty 1

## 2017-07-31 MED ORDER — LACTATED RINGERS IV SOLN
INTRAVENOUS | Status: AC
  Administered 2017-07-31: 21:00:00 via INTRAVENOUS

## 2017-07-31 MED ORDER — VITAMIN K1 10 MG/ML IJ SOLN
10.0000 mg | INTRAVENOUS | Status: AC
  Administered 2017-07-31 – 2017-08-02 (×3): 10 mg via INTRAVENOUS
  Filled 2017-07-31 (×3): qty 1

## 2017-07-31 MED ORDER — POTASSIUM CHLORIDE CRYS ER 20 MEQ PO TBCR
30.0000 meq | EXTENDED_RELEASE_TABLET | Freq: Three times a day (TID) | ORAL | Status: DC
  Administered 2017-07-31: 30 meq via ORAL
  Filled 2017-07-31: qty 1

## 2017-07-31 NOTE — Consult Note (Signed)
Referring Provider: No ref. provider found Primary Care Physician:  Redmond School, MD Primary Gastroenterologist:  Dr. Oneida Alar  Date of Admission: 07/31/17 Date of Consultation: 07/31/17  Reason for Consultation:  Acute pancreatitis, anemia, elevated LFTs.  HPI:  Deanna Berry is a 46 y.o. female with a past medical history of anemia, anxiety, bipolar disorder, chronic abdominal pain, constipation, gastri, peptic ulcer disease, substance abuse.she was recently seen in our office 05/02/2017 with a history of IDA. Last EGD prior to her visit was 03/24/2017 with a large nonbleeding can-based pyloric channel ulcer with narrowing of the pyloric channel and unable to advance the scope into the duodenum. At her last visit noted weight loss of approximately 5 pounds. On Protonix twice daily, constant upper abdominal pain. At her last visit she admitted taking NSAIDs recently but stopped after her most recent admission.fter her visit was recommended to continue Protonix twice daily, avoid NSAIDs, viscous lidocaine, upper GI series and upper endoscopy. Upper GI series was completed 05/18/2017 and found mucosal fold th at the pyloric channel ompatible with and likely representing sequela of previously identified pyloric channel ulcer, no current ulcer identified, otherwise unremarkable.  EGD complete 06/06/17 and found small hiatal hernia, non-bleeding gastric ulcver with associated deformed pylorus/gastric outlet obstruction. Recommended soft diet, continue medications, repeat EGD 2 months for surveillance and retreatment. Pathology revealed chronic inactive gastritis negative for H. Pylori.  She was seen at [facility] due to being found at home unresponsive with blood in the toilet and on the floor. History of ransfusions in the past secondary to hematemesd chronic ulcer. The family notes she was complaining of worsening abdominal pain for the previous 4-5 days and vomiting more frequently recently. At [facility]  rocking a.m. She was found to be anemic with a hemoglobin of 7 and received 2 units of red blood cells. CT of the abdomen found acute pancreatitis. Urinalysis found UTI. Drug screen positive for methadone. She was treated with IV fluids and IV ciprofloxacin and Flagyl and transferred to [facility] for GI consultation.er lipase was also found to be greater than 430 and elevated liver enzymes with ALT/AST 9:0/305.  Here a urine pregnancy test found to be negative. CBC with leucocytosis 21.5, anemia hgb 9.9, hct 29.36, plt normal at 261. CMP showed normal sodum at 145, elevated Cr 1.72, elevated BUN 85, AST/ALT improved to 128/664, Alk Phos 163, Bili 2.6. Lipase improved to 165. Ethanol negative. INR elevated at 2.01. CK elevated at 425, ammonia normal at 18.  Abdominal U/S with prominent pancreas without mass or inflammatory focus and likely element of pancreatitis; gallbladder with sludge and cholesterol crystals without evidence of acute cholecystitis or dilated CBD.   When I saw her in ICU 4 she was awake, but sluggish in response and appeared drowsy. Sates she has not been on methadone for a couple years. Question amongst family about "closet" drinking and/or drug use (per nursing staff). Admits abdominal pain, N/V. Denies rectal bleeding. States she did have some blood in her vomit. Nursing staff indicates urine with copious blood as well. Limited ROS due to patient mental states. No other GI complaints at this time.   Past Medical History:  Diagnosis Date  . Anemia   . Anxiety   . Bipolar disorder ([place])   . Chronic abdominal pain   . Cleft palate   . Constipation   . Depression   . Gastritis   . Headache(784.0)   . Hypothyroidism   . PUD (peptic ulcer disease)   .  Sinus congestion   . Substance abuse ([facility])     Past Surgical History:  Procedure Laterality Date  . BIOPSY  06/06/2017   Procedure: BIOPSY;  Surgeon: Fields, Sandi L, MD;  [facility];  Service: Endoscopy;;   gastric  . CESAREAN SECTION    . CLEFT PALATE REPAIR  2013  . COLONOSCOPY  2016   Dr. Benson: normal   . ESOPHAGOGASTRODUODENOSCOPY  03/2015   Dr. Benson: esophagitis, 2 medium-sized ulcers in antrum and pylorus, large ulcer in duodenal bulb, hiatal hernia, no specimens collected.   . ESOPHAGOGASTRODUODENOSCOPY  06/2015   Dr. Benson: small pyloric channel ulcer with mild pyloric stenosis, previous gastric and duodenal ulcers completely healed   . ESOPHAGOGASTRODUODENOSCOPY (EGD) WITH PROPOFOL N/A 03/24/2017   [facility]: Z-line regular, small hiatal hernia, non-bleeding large clean-based pyloric channel ulcer with narrowing of the pyloric channel, unable to advance scope into dudoenum, chronic gastritis  . ESOPHAGOGASTRODUODENOSCOPY (EGD) WITH PROPOFOL N/A 06/06/2017   Procedure: ESOPHAGOGASTRODUODENOSCOPY (EGD) WITH PROPOFOL;  Surgeon: Fields, Sandi L, MD;  Location: [facility];  Service: Endoscopy;  Laterality: N/A;  7:30am  . HERNIA REPAIR    . MULTIPLE TOOTH EXTRACTIONS    . SAVORY DILATION N/A 06/06/2017   Procedure: SAVORY DILATION;  Surgeon: Fields, Sandi L, MD;  Location: [facility];  Service: Endoscopy;  Laterality: N/A;    Prior to Admission medications   Medication Sig Start Date End Date Taking? Authorizing Provider  promethazine (PHENERGAN) 12.5 MG tablet Take 1 tablet (12.5 mg total) by mouth every 6 (six) hours as needed for nausea. 07/07/17  Yes Memon, Jehanzeb, MD  sucralfate (CARAFATE) 1 g tablet Take 1 tablet (1 g total) by mouth 4 (four) times daily -  with meals and at bedtime. 07/07/17  Yes Memon, Jehanzeb, MD  carbamazepine (TEGRETOL XR) 200 MG 12 hr tablet Take 200 mg by mouth 2 (two) times daily.  02/10/17   [provider]  carbamazepine (TEGRETOL XR) 400 MG 12 hr tablet Take 400 mg by mouth 2 (two) times daily. 05/19/17   [provider]  docusate sodium (COLACE) 100 MG capsule Take 1 capsule (100 mg total) by mouth 2 (two) times daily. 06/14/17   Abrol,  Nayana, MD  gabapentin (NEURONTIN) 800 MG tablet Take 800 mg by mouth 3 (three) times daily. 01/30/17   [provider]  levothyroxine (SYNTHROID, LEVOTHROID) 50 MCG tablet Take 1 tablet by mouth daily before breakfast. 06/29/17   [provider]  linaclotide (LINZESS) 290 MCG CAPS capsule Take 1 capsule (290 mcg total) by mouth daily before breakfast. 03/28/17   Boone, Anna W, NP  Melatonin 10 MG TABS Take 1 tablet by mouth at bedtime.    [provider]  nicotine (NICODERM CQ - DOSED IN MG/24 HOURS) 14 mg/24hr patch Place 1 patch (14 mg total) onto the skin daily. Patient not taking: Reported on 07/07/2017 06/15/17   Abrol, Nayana, MD  pantoprazole (PROTONIX) 40 MG tablet Take 1 tablet (40 mg total) by mouth 2 (two) times daily before a meal. 03/30/17   Korrine Sicard A, NP  QUEtiapine (SEROQUEL XR) 300 MG 24 hr tablet Take 300 mg by mouth at bedtime. 05/29/17   [provider]  TRINTELLIX 20 MG TABS Take 20 mg by mouth daily. 02/10/17   [provider]    Current Facility-Administered Medications  Medication Dose Route Frequency Provider Last Rate Last Dose  . 0.9 %  sodium chloride infusion   Intravenous Continuous Johnson,   Clanford L, MD 75 mL/hr at 07/31/17 1145    . cefTRIAXone (ROCEPHIN) 1 g in dextrose 5 % 50 mL IVPB  1 g Intravenous Q24H Johnson, Clanford L, MD      . docusate sodium (COLACE) capsule 100 mg  100 mg Oral BID Johnson, Clanford L, MD   100 mg at 07/31/17 1215  . HYDROmorphone (DILAUDID) injection 1 mg  1 mg Intravenous Q3H PRN Johnson, Clanford L, MD   1 mg at 07/31/17 1213  . ipratropium-albuterol (DUONEB) 0.5-2.5 (3) MG/3ML nebulizer solution 3 mL  3 mL Nebulization Q6H PRN Johnson, Clanford L, MD      . [START ON 08/01/2017] levothyroxine (SYNTHROID, LEVOTHROID) tablet 50 mcg  50 mcg Oral QAC breakfast Johnson, Clanford L, MD      . ondansetron (ZOFRAN) tablet 4 mg  4 mg Oral Q6H PRN Johnson, Clanford L, MD       Or  . ondansetron  (ZOFRAN) injection 4 mg  4 mg Intravenous Q6H PRN Johnson, Clanford L, MD      . pantoprazole (PROTONIX) 80 mg in sodium chloride 0.9 % 250 mL (0.32 mg/mL) infusion  8 mg/hr Intravenous Continuous Johnson, Clanford L, MD 25 mL/hr at 07/31/17 1213 8 mg/hr at 07/31/17 1213  . [START ON 08/03/2017] pantoprazole (PROTONIX) injection 40 mg  40 mg Intravenous Q12H Johnson, Clanford L, MD      . QUEtiapine (SEROQUEL XR) 24 hr tablet 300 mg  300 mg Oral QHS Johnson, Clanford L, MD      . senna (SENOKOT) tablet 8.6 mg  1 tablet Oral BID Johnson, Clanford L, MD   8.6 mg at 07/31/17 1216    Allergies as of 07/31/2017 - Review Complete 07/31/2017  Allergen Reaction Noted  . Amoxil [amoxicillin] Hives 12/11/2012  . Adhesive [tape] Rash 03/17/2017  . Neosporin original [bacitracin-neomycin-polymyxin] Rash 03/17/2017    Family History  Problem Relation Age of Onset  . Colon cancer Mother 60    Social History   Social History  . Marital status: Single    Spouse name: N/A  . Number of children: N/A  . Years of education: N/A   Occupational History  . office manager    Social History Main Topics  . Smoking status: Current Every Day Smoker    Packs/day: 1.00    Years: 31.00    Types: Cigarettes  . Smokeless tobacco: Never Used  . Alcohol use No  . 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 Sexual activity: Not on file   Other Topics Concern  . Not on file   Social History Narrative  . No narrative on file    Review of Systems: Limited due to mental status; as per HPI  Physical Exam: Vital signs in last 24 hours: Pulse Rate:  [90-96] 94 (10/01 1300) Resp:  [9-20] 20 (10/01 1300) BP: (128-136)/(75-82) 131/82 (10/01 1300) SpO2:  [91 %-100 %] 100 % (10/01 1300)   General:   Drowsy, sluggishly responsive. Well-developed, well-nourished, in NAD Head:  Normocephalic and atraumatic. Eyes:  Sclera clear, no icterus. Conjunctiva pink. Ears:  Normal auditory  acuity. Neck:  Supple; no masses or thyromegaly. Lungs:  Clear throughout to auscultation. No wheezes, crackles, or rhonchi. No acute distress. Heart:  Regular rate and rhythm; no murmurs, clicks, rubs,  or gallops. Abdomen:  Soft, and nondistended. Moderate mid/generalized abdominal TTP. No masses, hepatosplenomegaly or hernias noted. Normal bowel sounds, without guarding, and without rebound.     Hepatic: No jaundice noted, positive asterixis Rectal:  Deferred.   Msk:  Symmetrical without gross deformities. Pulses:  Normal bilateral DP pulses noted. Extremities:  Without clubbing or edema. Skin:  Intact without significant lesions or rashes. Psych:  Sluggish and drowsy.  Intake/Output from previous day: No intake/output data recorded. Intake/Output this shift: Total I/O In: -  Out: 700 [Urine:700]  Lab Results:  Recent Labs  07/31/17 1140  WBC 21.5*  HGB 9.9*  HCT 29.3*  PLT 261   BMET  Recent Labs  07/31/17 1140  NA 145  K 3.2*  CL 114*  CO2 18*  GLUCOSE 105*  BUN 85*  CREATININE 1.72*  CALCIUM 8.3*   LFT  Recent Labs  07/31/17 1140  PROT 6.1*  ALBUMIN 2.7*  AST 128*  ALT 664*  ALKPHOS 163*  BILITOT 2.6*   PT/INR  Recent Labs  07/31/17 1140  LABPROT 22.6*  INR 2.01   Hepatitis Panel No results for input(s): HEPBSAG, HCVAB, HEPAIGM, HEPBIGM in the last 72 hours. C-Diff No results for input(s): CDIFFTOX in the last 72 hours.  Studies/Results: US Abdomen Complete  Result Date: 07/31/2017 CLINICAL DATA:  Elevated liver enzymes and pancreatitis EXAM: ABDOMEN ULTRASOUND COMPLETE COMPARISON:  CT abdomen and pelvis July 31, 2017 FINDINGS: Gallbladder: Within the gallbladder, there are multiple tiny echogenic foci which may represent cholesterol crystals. There is moderate sludge in the gallbladder. No well-defined gallstones are seen. There is no appreciable gallbladder wall thickening or pericholecystic fluid. No sonographic Murphy sign noted by  sonographer. Common bile duct: Diameter: 2 mm. There is no intrahepatic, common hepatic, or common bile duct dilatation. Liver: No focal lesion identified. Within normal limits in parenchymal echogenicity. Portal vein is patent on color Doppler imaging with normal direction of blood flow towards the liver. IVC: No abnormality visualized. Pancreas: Pancreas overall appears prominent and edematous without well-defined mass or fluid collection. There is no pancreatic duct dilatation. Spleen: Size and appearance within normal limits. Right Kidney: Length: 9.3 cm. Echogenicity within normal limits. No mass visualized. There is mild pelvicaliectasis. Left Kidney: Length: 9.3 cm. Echogenicity within normal limits. There is a cyst in the lower pole left kidney medially measuring 1.0 x 0.8 x 0.9 cm. There is mild pelvicaliectasis. Abdominal aorta: No aneurysm visualized. Other findings: No demonstrable ascites. IMPRESSION: 1. Pancreas appears prominent and edematous without mass or inflammatory focus. The appearance of the pancreas does indicate a degree of acute pancreatitis. 2. Sludge and cholesterol crystals within the gallbladder. No gallstones evident. No gallbladder wall thickening or pericholecystic fluid. 3. Slight fullness of each renal collecting system without focal area of obstruction identified on either side. 4.  Small left renal cyst. Electronically Signed   By: Lowella Grip III M.D.   On: 07/31/2017 12:03    Impression: 46 year old female transferred from [facility] for liver enzyme elevation, acute pancreatitis, anemia with hematemesis; found unresponsive and remains with altered mental status. Question ETOH and/or drug use although patient denies (negative ethanol level, positive methadone in UDS at [facility]). The patient has quite significant LFT derangements. Acute pancreatitis on CT imaging at [facility], elevated lipase here. Abdominal pain, N/V. Very complex picture at this  point, may become clearer as patient mental status improves.  LFTs as per above. MELD calculated at 23 today. Elevated Cr possibly due to liver issues versus pancreatitis versus elevated CK. On 75 mL NS an hour currently. Her presentation and illness is likely multifactorial in nature with possible contributions from  ETOH, drug use, acute hepatitis, alcoholic hepatitis, acute pancreatitis.  If she continues with persistent abdominal pain, N/V may need surgical consult for possible gastric bypass as all medical options have nearly been exhausted at this point related to gastric ulcer and associated pyloric stenosis.  Hematemesis possibly due to elevated INR in the setting of persistent vomiting with acute pancreatitis.  Plan: 1. Follow all labs 2. Check Tylenol level 3. IVF to 150 cc NS per hour 4. Start Mucomyst: 140 mg/kg loading dose; 70 mg/kg q 4 hours for 3 days (18 doses) 5. Monitor for further GI bleed 6. Monitor H/H closely 7. CBC, HFP, BMP, INR tomorrow 8. Supportive measures   Thank you for allowing [place] to participate in the care of Arthor Captain, DNP, AGNP-C Adult & Gerontological Nurse Practitioner [facility]    LOS: 0 days     07/31/2017, 1:33 PM    ADDENDUM: No weight entered into the chart. Call ICU and spoke with patient's nurse who said the weight was 44.5 kg and she would put thisin the flow sheet. 30 mins later weight not in the system, entered manual weight of 44.5 kg in pharmacy calculation for Mucomyst dosing.

## 2017-07-31 NOTE — Progress Notes (Signed)
Patient has gradually improved throughout this shift. Patient noted more alert and able to state her full name, DOB and place. Patient continues to c/o some nausea, no vomiting noted so far. PRN antiemetic given and effective. Patient c/o pain to upper/mid ABD and lower back but has been effectively controlled with PRN pain medication at this time. Family has been at bedside most of the day.

## 2017-07-31 NOTE — H&P (Signed)
History and Physical  Aziyah Provencal MEQ:[id number] DOB: 05-09-1971 DOA: 07/31/2017  PCP: Redmond School, MD   Pt was transferred from [place]: Found down at home  Historians: father, old records, reports from nursing staff, pt unable to participate  HPI: Deanna Berry is a 46 y.o. female with a complex PMH including bipolar disorder, substance abuse, PUD, apparently has been on methadone and reportedly has been clean for a couple of years was found down at home in the bathroom with blood in toilet and on the floor.  The patient does have a history of a bleeding ulcer in the stomach and has been followed by [facility]. The patient has had a history of blood transfusions in the past secondary to hematemesis. The patient has a history of chronic abdominal pain but family reports that she has been complaining of worsening abdominal pain for the past 4-5 days. She is also been vomiting more frequently in the past several days. She was initially taken to [facility] noted to be anemic with a hemoglobin of 7. She received 2 units of red blood cells. She had a CT of the abdomen that was significant for acute pancreatitis. She was found to have a UTI. Her drug screen was positive for methadone only. She was treated with IV fluids and IV ciprofloxacin and Flagyl at [facility]. She was transferred to this hospital for GI consultation. Her lipase was noted to be greater than 4:30. She also had an elevated liver enzyme. Her ALT was 930 and the AST was 305.   Review of Systems: Unable to obtain due to patient's condition  Past Medical History:  Diagnosis Date  . Anemia   . Anxiety   . Bipolar disorder ([place])   . Chronic abdominal pain   . Cleft palate   . Constipation   . Depression   . Gastritis   . Headache(784.0)   . Hypothyroidism   . PUD (peptic ulcer disease)   . Sinus congestion   . Substance abuse [facility])    Past Surgical History:  Procedure Laterality Date  . BIOPSY   06/06/2017   Procedure: BIOPSY;  Surgeon: Danie Binder, MD;  [facility];  Service: Endoscopy;;  gastric  . CESAREAN SECTION    . CLEFT PALATE REPAIR  2013  . COLONOSCOPY  2016   Dr. Britta Mccreedy: normal   . ESOPHAGOGASTRODUODENOSCOPY  03/2015   Dr. Britta Mccreedy: esophagitis, 2 medium-sized ulcers in antrum and pylorus, large ulcer in duodenal bulb, hiatal hernia, no specimens collected.   . ESOPHAGOGASTRODUODENOSCOPY  06/2015   Dr. Britta Mccreedy: small pyloric channel ulcer with mild pyloric stenosis, previous gastric and duodenal ulcers completely healed   . ESOPHAGOGASTRODUODENOSCOPY (EGD) WITH PROPOFOL N/A 03/24/2017   [facility]: Z-line regular, small hiatal hernia, non-bleeding large clean-based pyloric channel ulcer with narrowing of the pyloric channel, unable to advance scope into dudoenum, chronic gastritis  . ESOPHAGOGASTRODUODENOSCOPY (EGD) WITH PROPOFOL N/A 06/06/2017   Procedure: ESOPHAGOGASTRODUODENOSCOPY (EGD) WITH PROPOFOL;  Surgeon: Danie Binder, MD;  Location[facility];  Service: Endoscopy;  Laterality: N/A;  7:30am  . HERNIA REPAIR    . MULTIPLE TOOTH EXTRACTIONS    . SAVORY DILATION N/A 06/06/2017   Procedure: SAVORY DILATION;  Surgeon: Danie Binder, MD;  Location: [facility];  Service: Endoscopy;  Laterality: N/A;   Social History:  reports that she has been smoking Cigarettes.  She has a 31.00 pack-year smoking history. She has never used  smokeless tobacco. She reports that she does not drink alcohol or use drugs.  Allergies  Allergen Reactions  . Amoxil [Amoxicillin] Hives    Has patient had a PCN reaction causing immediate rash, facial/tongue/throat swelling, SOB or lightheadedness with hypotension: No Has patient had a PCN reaction causing severe rash involving mucus membranes or skin necrosis: No Has patient had a PCN reaction that required hospitalization: No Has patient had a PCN reaction occurring within the last 10 years: No If all of the above answers are  "NO", then may proceed with Cephalosporin use.   . Adhesive [Tape] Rash  . Neosporin Original [Bacitracin-Neomycin-Polymyxin] Rash    Family History  Problem Relation Age of Onset  . Colon cancer Mother 78    Prior to Admission medications   Medication Sig Start Date End Date Taking? Authorizing Provider  promethazine (PHENERGAN) 12.5 MG tablet Take 1 tablet (12.5 mg total) by mouth every 6 (six) hours as needed for nausea. 07/07/17  Yes Kathie Dike, MD  sucralfate (CARAFATE) 1 g tablet Take 1 tablet (1 g total) by mouth 4 (four) times daily -  with meals and at bedtime. 07/07/17  Yes Kathie Dike, MD  carbamazepine (TEGRETOL XR) 200 MG 12 hr tablet Take 200 mg by mouth 2 (two) times daily.  02/10/17   [provider]  carbamazepine (TEGRETOL XR) 400 MG 12 hr tablet Take 400 mg by mouth 2 (two) times daily. 05/19/17   [provider]  docusate sodium (COLACE) 100 MG capsule Take 1 capsule (100 mg total) by mouth 2 (two) times daily. 06/14/17   Reyne Dumas, MD  gabapentin (NEURONTIN) 800 MG tablet Take 800 mg by mouth 3 (three) times daily. 01/30/17   [provider]  levothyroxine (SYNTHROID, LEVOTHROID) 50 MCG tablet Take 1 tablet by mouth daily before breakfast. 06/29/17   [provider]  linaclotide Rolan Lipa) 290 MCG CAPS capsule Take 1 capsule (290 mcg total) by mouth daily before breakfast. 03/28/17   Annitta Needs, NP  Melatonin 10 MG TABS Take 1 tablet by mouth at bedtime.    [provider]  nicotine (NICODERM CQ - DOSED IN MG/24 HOURS) 14 mg/24hr patch Place 1 patch (14 mg total) onto the skin daily. Patient not taking: Reported on 07/07/2017 06/15/17   Reyne Dumas, MD  pantoprazole (PROTONIX) 40 MG tablet Take 1 tablet (40 mg total) by mouth 2 (two) times daily before a meal. 03/30/17   Carlis Stable, NP  QUEtiapine (SEROQUEL XR) 300 MG 24 hr tablet Take 300 mg by mouth at bedtime. 05/29/17   [provider]  TRINTELLIX 20 MG TABS  Take 20 mg by mouth daily. 02/10/17   [provider]   Physical Exam: There were no vitals filed for this visit.   General exam: Moderately built and emaciated patient, lying comfortably supine in no obvious distress. She is awake but not speaking or answering questions.  Head, eyes and ENT: Nontraumatic and normocephalic. Pupils equally reacting to light and accommodation. Oral mucosa dry.  Neck: Supple. No JVD, carotid bruit or thyromegaly.  Lymphatics: No lymphadenopathy.  Respiratory system: Clear to auscultation. No increased work of breathing.  Cardiovascular system: S1 and S2 heard. No JVD.  Gastrointestinal system: Abdomen is nondistended, soft and moderate to severe epigastric Tenderness with guarding. Normal bowel sounds heard. No organomegaly or masses appreciated.  Central nervous system: Alert. No focal neurological deficits.  Extremities: No pretibial edema. Peripheral pulses symmetrically felt.   Skin: needle injection marks  noted arms.   Musculoskeletal system: No acute findings.   Psychiatry: flat affect.   Labs on Admission:  Basic Metabolic Panel: No results for input(s): NA, K, CL, CO2, GLUCOSE, BUN, CREATININE, CALCIUM, MG, PHOS in the last 168 hours. Liver Function Tests: No results for input(s): AST, ALT, ALKPHOS, BILITOT, PROT, ALBUMIN in the last 168 hours. No results for input(s): LIPASE, AMYLASE in the last 168 hours. No results for input(s): AMMONIA in the last 168 hours. CBC: No results for input(s): WBC, NEUTROABS, HGB, HCT, MCV, PLT in the last 168 hours. Cardiac Enzymes: No results for input(s): CKTOTAL, CKMB, CKMBINDEX, TROPONINI in the last 168 hours.  BNP (last 3 results) No results for input(s): PROBNP in the last 8760 hours. CBG: No results for input(s): GLUCAP in the last 168 hours.  Radiological Exams on Admission: No results found.  EKG: Independently reviewed.   Assessment/Plan Principal Problem:   Pancreatitis,  acute Active Problems:   GI bleeding   Bipolar 1 disorder (H[facility])   Substance abuse (H[facility])   Anemia   Hypothyroidism   PUD (peptic ulcer disease)   Hematuria   Tobacco dependence   Vomiting   Elevated liver enzymes   Acute blood loss anemia   Fall at home   Abdominal pain, epigastric   Acute pancreatitis  1. Acute Pancreatitis - Admit to SDU, Keep NPO, continue supportive care with IVFs, check Abd [place], check lipid panel. GI consult requested.   2. Hematemesis - IV protonix ordered, s/p 2 units PRBCs.  Monitor Hemodynamics closely.  GI consulted.  3. Acute blood loss anemia - s/p 2 units of PRBCs, Monitor CBC closely and transfuse further if needed.  4. Acute Hepatitis - check acute Hep Panel, follow and trend liver enzymes.  5. Bipolar Disorder - reconcile home medications and resume home medications as appropriate.  6. UTI - culture urine, IV ceftriaxone ordered.   7. Severe Dehdyration - IVF hydration ordered 8. Found down at home - Follow and trend CK levels.   DVT Prophylaxis: SCDs Code Status: Full   Family Communication: father at bedside   Disposition Plan: TBD   Critical Care Time spent: 63 mins  Irwin Brakeman, MD [facility] Pager [phone]  If 7PM-7AM, please contact night-coverage [email or web address] Password [email or web address] 07/31/2017, 10:51 AM

## 2017-07-31 NOTE — Plan of Care (Signed)
Problem: Safety: Goal: Ability to remain free from injury will improve Outcome: Progressing Pt noted more alert and able to use call bell when assistance is needed, bed in lowest position, call bell & personal items within reach, using BSC with assistance, bed alarm on, family at bedside

## 2017-08-01 DIAGNOSIS — W19XXXD Unspecified fall, subsequent encounter: Secondary | ICD-10-CM

## 2017-08-01 LAB — CBC WITH DIFFERENTIAL/PLATELET
BASOS PCT: 0 %
Basophils Absolute: 0 10*3/uL (ref 0.0–0.1)
EOS ABS: 0.5 10*3/uL (ref 0.0–0.7)
EOS PCT: 3 %
HEMATOCRIT: 28.1 % — AB (ref 36.0–46.0)
Hemoglobin: 9.6 g/dL — ABNORMAL LOW (ref 12.0–15.0)
LYMPHS ABS: 2.9 10*3/uL (ref 0.7–4.0)
Lymphocytes Relative: 17 %
MCH: 29.5 pg (ref 26.0–34.0)
MCHC: 34.2 g/dL (ref 30.0–36.0)
MCV: 86.5 fL (ref 78.0–100.0)
MONO ABS: 1.5 10*3/uL — AB (ref 0.1–1.0)
Monocytes Relative: 9 %
NEUTROS PCT: 71 %
Neutro Abs: 12 10*3/uL — ABNORMAL HIGH (ref 1.7–7.7)
PLATELETS: 223 10*3/uL (ref 150–400)
RBC: 3.25 MIL/uL — AB (ref 3.87–5.11)
RDW: 18.4 % — AB (ref 11.5–15.5)
WBC: 16.9 10*3/uL — AB (ref 4.0–10.5)

## 2017-08-01 LAB — COMPREHENSIVE METABOLIC PANEL
ALK PHOS: 127 U/L — AB (ref 38–126)
ALT: 392 U/L — AB (ref 14–54)
ANION GAP: 7 (ref 5–15)
AST: 48 U/L — ABNORMAL HIGH (ref 15–41)
Albumin: 2.3 g/dL — ABNORMAL LOW (ref 3.5–5.0)
BILIRUBIN TOTAL: 1.7 mg/dL — AB (ref 0.3–1.2)
BUN: 51 mg/dL — ABNORMAL HIGH (ref 6–20)
CALCIUM: 7.9 mg/dL — AB (ref 8.9–10.3)
CO2: 19 mmol/L — ABNORMAL LOW (ref 22–32)
CREATININE: 1.35 mg/dL — AB (ref 0.44–1.00)
Chloride: 118 mmol/L — ABNORMAL HIGH (ref 101–111)
GFR calc non Af Amer: 46 mL/min — ABNORMAL LOW (ref >60)
GFR, EST AFRICAN AMERICAN: 54 mL/min — AB (ref >60)
Glucose, Bld: 78 mg/dL (ref 65–99)
Potassium: 3.5 mmol/L (ref 3.5–5.1)
Sodium: 144 mmol/L (ref 135–145)
TOTAL PROTEIN: 5.3 g/dL — AB (ref 6.5–8.1)

## 2017-08-01 LAB — HEPATITIS PANEL, ACUTE
HCV Ab: <0.1 s/co ratio (ref 0.0–0.9)
HEP B S AG: NEGATIVE
Hep A IgM: NEGATIVE
Hep B C IgM: NEGATIVE

## 2017-08-01 LAB — PROTIME-INR
INR: 1.41
Prothrombin Time: 17.1 seconds — ABNORMAL HIGH (ref 11.4–15.2)

## 2017-08-01 LAB — MAGNESIUM: Magnesium: 3.2 mg/dL — ABNORMAL HIGH (ref 1.7–2.4)

## 2017-08-01 LAB — CK: Total CK: 198 U/L (ref 38–234)

## 2017-08-01 LAB — LIPASE, BLOOD: Lipase: 95 U/L — ABNORMAL HIGH (ref 11–51)

## 2017-08-01 LAB — AMMONIA: Ammonia: 38 umol/L — ABNORMAL HIGH (ref 9–35)

## 2017-08-01 LAB — LIPID PANEL
CHOL/HDL RATIO: 7.3 ratio
CHOLESTEROL: 131 mg/dL (ref 0–200)
HDL: 18 mg/dL — ABNORMAL LOW (ref >40)
LDL CALC: 67 mg/dL (ref 0–99)
TRIGLYCERIDES: 232 mg/dL — AB (ref <150)
VLDL: 46 mg/dL — AB (ref 0–40)

## 2017-08-01 MED ORDER — PANTOPRAZOLE SODIUM 40 MG PO TBEC
40.0000 mg | DELAYED_RELEASE_TABLET | Freq: Two times a day (BID) | ORAL | Status: DC
  Administered 2017-08-01 – 2017-08-05 (×8): 40 mg via ORAL
  Filled 2017-08-01 (×8): qty 1

## 2017-08-01 MED ORDER — HYDROMORPHONE HCL 1 MG/ML IJ SOLN
0.5000 mg | INTRAMUSCULAR | Status: DC | PRN
  Administered 2017-08-01 – 2017-08-04 (×4): 1 mg via INTRAVENOUS
  Filled 2017-08-01 (×4): qty 1

## 2017-08-01 MED ORDER — LINACLOTIDE 145 MCG PO CAPS
290.0000 ug | ORAL_CAPSULE | Freq: Every day | ORAL | Status: DC
  Administered 2017-08-02 – 2017-08-03 (×2): 290 ug via ORAL
  Filled 2017-08-01: qty 1
  Filled 2017-08-01 (×5): qty 2

## 2017-08-01 MED ORDER — CARBAMAZEPINE ER 400 MG PO TB12
400.0000 mg | ORAL_TABLET | Freq: Two times a day (BID) | ORAL | Status: DC
  Administered 2017-08-01 – 2017-08-05 (×9): 400 mg via ORAL
  Filled 2017-08-01 (×13): qty 1

## 2017-08-01 MED ORDER — PANTOPRAZOLE SODIUM 40 MG IV SOLR
40.0000 mg | Freq: Two times a day (BID) | INTRAVENOUS | Status: DC
  Administered 2017-08-01: 40 mg via INTRAVENOUS
  Filled 2017-08-01: qty 40

## 2017-08-01 MED ORDER — CARBAMAZEPINE ER 100 MG PO TB12
200.0000 mg | ORAL_TABLET | Freq: Two times a day (BID) | ORAL | Status: DC
  Administered 2017-08-01 – 2017-08-05 (×9): 200 mg via ORAL
  Filled 2017-08-01 (×13): qty 2

## 2017-08-01 MED ORDER — LACTULOSE 10 GM/15ML PO SOLN
10.0000 g | Freq: Every day | ORAL | Status: DC
  Administered 2017-08-01: 10 g via ORAL
  Filled 2017-08-01: qty 30

## 2017-08-01 MED ORDER — VORTIOXETINE HBR 20 MG PO TABS
20.0000 mg | ORAL_TABLET | Freq: Every day | ORAL | Status: DC
  Administered 2017-08-01 – 2017-08-05 (×4): 20 mg via ORAL
  Filled 2017-08-01 (×7): qty 20

## 2017-08-01 NOTE — Progress Notes (Signed)
PROGRESS NOTE   Deanna Berry  KVQ:[id number]  DOB: 15-Jan-1971  DOA: 07/31/2017 PCP: Redmond School, MD  Brief Admission Hx: Deanna Berry is a 46 y.o. female with a complex PMH including bipolar disorder, substance abuse, PUD, apparently has been on methadone and reportedly has been clean for a couple of years was found down at home in the bathroom with blood in toilet and on the floor.  She was transferred from [facility] for GI consultation for acute pancreatitis, elevated liver enzymes, elevated PT/INR, obtundation and UTI.    MDM/Assessment & Plan:   1. Acute Pancreatitis - Lipase trending down, Abd [place] did not show gallbladder disease or stones, lipid panel did show elevated triglycerides at 361, will repeat this AM to confirm, but this could have contributed to pancreatitis. GI consulted. Pt is NPO but could consider trial of fibrate therapy to reduce triglycerides when able to eat.    2. Hematemesis (presumed prior to arrival) but not witnessed - IV protonix ordered, s/p 2 units PRBCs given.  Monitor Hemodynamics closely.  GI consulted.  3. Acute blood loss anemia - s/p 2 units of PRBCs given at [facility], Monitor CBC closely and transfuse further if needed.  4. Acute Hepatitis -  acute Hep Panel pending, ALT is trending down which is reassuring, continue to follow and trend liver enzymes. 5. Acute Kidney Injury - Continue IVF hydration, creatinine trending down, BUN trending down.   6. Coagulopathy of liver injury - IV Vitamin K ordered and INR trending down to normal range. Following.   7. Bipolar Disorder - reconcile home medications and resume home medications as appropriate.  8. UTI - culture urine, IV ceftriaxone ordered.  9. Leukocytosis - WBC trending down, suspect this is secondary to UTI, Blood cultures NGTD  10. Severe Dehdyration - IVF hydration ordered 11. Found down at home - Follow and trend CK levels. CK is trending down with hydration.   12. Substance abuse - Urine  drug screen done at [facility] - R was positive for methadone only.    DVT Prophylaxis: SCDs Code Status: Full   Family Communication: father at bedside   Disposition Plan: TBD   Consultants:  [facility]  Subjective: Pt more responsive this morning, still sleepy but arousable, no complaints this morning.   Objective: Vitals:   08/01/17 0300 08/01/17 0400 08/01/17 0500 08/01/17 0600  BP: 107/90 113/78 117/72 105/66  Pulse: 86 83 88 92  Resp: 10 10 13 10   Temp:      TempSrc:      SpO2: 95% 98% 98% 99%  Weight:      Height:        Intake/Output Summary (Last 24 hours) at 08/01/17 0647 Last data filed at 08/01/17 0600  Gross per 24 hour  Intake          2684.58 ml  Output             1200 ml  Net          1484.58 ml   Filed Weights   07/31/17 0840 07/31/17 1400  Weight: 44.5 kg (98 lb 1.7 oz) 44.5 kg (98 lb 1.7 oz)    REVIEW OF SYSTEMS  As per history otherwise all reviewed and reported negative  Exam:  General exam: sleepy but arousable, poorly kept, chronically ill appearing, emaciated female, NAD.  Respiratory system: Clear. No increased work of breathing. Cardiovascular system: S1 & S2 heard.  No JVD, murmurs, gallops, clicks or pedal edema. Gastrointestinal system: Abdomen  is nondistended, soft and moderate epigastric tenderness with palpation and guarding. Normal bowel sounds heard. Central nervous system: Awake. No focal neurological deficits. Extremities: no CCE.  Data Reviewed: Basic Metabolic Panel:  Recent Labs Lab 07/31/17 1140 08/01/17 0409  NA 145 144  K 3.2* 3.5  CL 114* 118*  CO2 18* 19*  GLUCOSE 105* 78  BUN 85* 51*  CREATININE 1.72* 1.35*  CALCIUM 8.3* 7.9*  MG  --  3.2*   Liver Function Tests:  Recent Labs Lab 07/31/17 1140 08/01/17 0409  AST 128* 48*  ALT 664* 392*  ALKPHOS 163* 127*  BILITOT 2.6* 1.7*  PROT 6.1* 5.3*  ALBUMIN 2.7* 2.3*    Recent Labs Lab 07/31/17 1140 08/01/17 0409  LIPASE 165* 95*    Recent  Labs Lab 07/31/17 1202 08/01/17 0409  AMMONIA 18 38*   CBC:  Recent Labs Lab 07/31/17 1140 08/01/17 0409  WBC 21.5* 16.9*  NEUTROABS  --  PENDING  HGB 9.9* 9.6*  HCT 29.3* 28.1*  MCV 84.7 86.5  PLT 261 223   Cardiac Enzymes:  Recent Labs Lab 07/31/17 1140 08/01/17 0409  CKTOTAL 425* 198   CBG (last 3)  No results for input(s): GLUCAP in the last 72 hours. Recent Results (from the past 240 hour(s))  MRSA PCR Screening     Status: None   Collection Time: 07/31/17 11:43 AM  Result Value Ref Range Status   MRSA by PCR NEGATIVE NEGATIVE Final    Comment:        The GeneXpert MRSA Assay (FDA approved for NASAL specimens only), is one component of a comprehensive MRSA colonization surveillance program. It is not intended to diagnose MRSA infection nor to guide or monitor treatment for MRSA infections.   Culture, blood (Routine X 2) w Reflex to ID Panel     Status: None (Preliminary result)   Collection Time: 07/31/17  2:52 PM  Result Value Ref Range Status   Specimen Description RIGHT ANTECUBITAL  Final   Special Requests   Final    BOTTLES DRAWN AEROBIC ONLY Blood Culture results may not be optimal due to an inadequate volume of blood received in culture bottles   Culture PENDING  Incomplete   Report Status PENDING  Incomplete  Culture, blood (Routine X 2) w Reflex to ID Panel     Status: None (Preliminary result)   Collection Time: 07/31/17  2:52 PM  Result Value Ref Range Status   Specimen Description BLOOD LEFT HAND  Final   Special Requests   Final    BOTTLES DRAWN AEROBIC ONLY Blood Culture results may not be optimal due to an inadequate volume of blood received in culture bottles   Culture PENDING  Incomplete   Report Status PENDING  Incomplete     Studies: US Abdomen Complete  Result Date: 07/31/2017 CLINICAL DATA:  Elevated liver enzymes and pancreatitis EXAM: ABDOMEN ULTRASOUND COMPLETE COMPARISON:  CT abdomen and pelvis July 31, 2017 FINDINGS:  Gallbladder: Within the gallbladder, there are multiple tiny echogenic foci which may represent cholesterol crystals. There is moderate sludge in the gallbladder. No well-defined gallstones are seen. There is no appreciable gallbladder wall thickening or pericholecystic fluid. No sonographic Murphy sign noted by sonographer. Common bile duct: Diameter: 2 mm. There is no intrahepatic, common hepatic, or common bile duct dilatation. Liver: No focal lesion identified. Within normal limits in parenchymal echogenicity. Portal vein is patent on color Doppler imaging with normal direction of blood flow towards the liver. IVC: No abnormality  visualized. Pancreas: Pancreas overall appears prominent and edematous without well-defined mass or fluid collection. There is no pancreatic duct dilatation. Spleen: Size and appearance within normal limits. Right Kidney: Length: 9.3 cm. Echogenicity within normal limits. No mass visualized. There is mild pelvicaliectasis. Left Kidney: Length: 9.3 cm. Echogenicity within normal limits. There is a cyst in the lower pole left kidney medially measuring 1.0 x 0.8 x 0.9 cm. There is mild pelvicaliectasis. Abdominal aorta: No aneurysm visualized. Other findings: No demonstrable ascites. IMPRESSION: 1. Pancreas appears prominent and edematous without mass or inflammatory focus. The appearance of the pancreas does indicate a degree of acute pancreatitis. 2. Sludge and cholesterol crystals within the gallbladder. No gallstones evident. No gallbladder wall thickening or pericholecystic fluid. 3. Slight fullness of each renal collecting system without focal area of obstruction identified on either side. 4.  Small left renal cyst. Electronically Signed   By: Lowella Grip III M.D.   On: 07/31/2017 12:03    Scheduled Meds: . acetylcysteine  70 mg/kg Oral Q4H  . acetylcysteine  140 mg/kg (Order-Specific) Oral Once  . levothyroxine  50 mcg Oral QAC breakfast  . pantoprazole  40 mg  Intravenous Q12H  . QUEtiapine  300 mg Oral QHS   Continuous Infusions: . cefTRIAXone (ROCEPHIN)  IV Stopped (07/31/17 1500)  . lactated ringers with kcl    . lactated ringers 150 mL/hr at 07/31/17 2129  . phytonadione (VITAMIN K) IV Stopped (07/31/17 2227)    Principal Problem:   Pancreatitis, acute Active Problems:   GI bleeding   Bipolar 1 disorder (HCC)   Substance abuse (HCC)   Anemia   Hypothyroidism   PUD (peptic ulcer disease)   Hematuria   Tobacco dependence   Vomiting   Elevated liver enzymes   Acute blood loss anemia   Fall at home   Abdominal pain, epigastric   Acute pancreatitis   UTI (urinary tract infection)   Leukocytosis  Critical Care Time spent: 35 mins  Irwin Brakeman, MD, FAAFP [facility] Pager [phone]  If 7PM-7AM, please contact night-coverage [email or web address] Password [email or web address] 08/01/2017, 6:47 AM    LOS: 1 day

## 2017-08-01 NOTE — Plan of Care (Signed)
Problem: Nutrition: Goal: Adequate nutrition will be maintained Outcome: Progressing Patient assessed for swallow function. No issues with swallowing. Will see if GI will allow clear liquids for dinner.

## 2017-08-01 NOTE — Progress Notes (Signed)
Subjective: Drowsy but easily awakens. Does not know year but knows the city. Does not remember me from clinic. Asking where her daddy is. Notes pain "all over" and when asked to clarify, states she has pain in her back. No stool documented overnight. Day shift nursing state rectal bleeding overnight but number of stools was not given in report.   Objective: Vital signs in last 24 hours: Temp:  [97.4 F (36.3 C)] 97.4 F (36.3 C) (10/01 0840) Pulse Rate:  [80-96] 92 (10/02 0600) Resp:  [7-20] 10 (10/02 0600) BP: (105-136)/(66-92) 105/66 (10/02 0600) SpO2:  [88 %-100 %] 99 % (10/02 0600) Weight:  [98 lb 1.7 oz (44.5 kg)] 98 lb 1.7 oz (44.5 kg) (10/01 1400)   General:   Appears older than stated age, chronically ill, drowsy but awakens to verbal stimuli  Head:  Normocephalic and atraumatic. Abdomen:  Bowel sounds present, soft, TTP upper abdomen/RUQ, no guarding.  Msk:  Symmetrical without gross deformities. Normal posture. Extremities:  Without  edema. Neurologic:  Awakens to verbal stimuli, oriented to person and place only. +asterixis on exam   Intake/Output from previous day: 10/01 0701 - 10/02 0700 In: 2684.6 [I.V.:2584.6; IV Piggyback:100] Out: 1200 [Urine:1200] Intake/Output this shift: No intake/output data recorded.  Lab Results:  Recent Labs  07/31/17 1140 08/01/17 0409  WBC 21.5* 16.9*  HGB 9.9* 9.6*  HCT 29.3* 28.1*  PLT 261 223   BMET  Recent Labs  07/31/17 1140 08/01/17 0409  NA 145 144  K 3.2* 3.5  CL 114* 118*  CO2 18* 19*  GLUCOSE 105* 78  BUN 85* 51*  CREATININE 1.72* 1.35*  CALCIUM 8.3* 7.9*   LFT  Recent Labs  07/31/17 1140 08/01/17 0409  PROT 6.1* 5.3*  ALBUMIN 2.7* 2.3*  AST 128* 48*  ALT 664* 392*  ALKPHOS 163* 127*  BILITOT 2.6* 1.7*   PT/INR  Recent Labs  07/31/17 1140 08/01/17 0409  LABPROT 22.6* 17.1*  INR 2.01 1.41     Studies/Results: US Abdomen Complete  Result Date: 07/31/2017 CLINICAL DATA:  Elevated  liver enzymes and pancreatitis EXAM: ABDOMEN ULTRASOUND COMPLETE COMPARISON:  CT abdomen and pelvis July 31, 2017 FINDINGS: Gallbladder: Within the gallbladder, there are multiple tiny echogenic foci which may represent cholesterol crystals. There is moderate sludge in the gallbladder. No well-defined gallstones are seen. There is no appreciable gallbladder wall thickening or pericholecystic fluid. No sonographic Murphy sign noted by sonographer. Common bile duct: Diameter: 2 mm. There is no intrahepatic, common hepatic, or common bile duct dilatation. Liver: No focal lesion identified. Within normal limits in parenchymal echogenicity. Portal vein is patent on color Doppler imaging with normal direction of blood flow towards the liver. IVC: No abnormality visualized. Pancreas: Pancreas overall appears prominent and edematous without well-defined mass or fluid collection. There is no pancreatic duct dilatation. Spleen: Size and appearance within normal limits. Right Kidney: Length: 9.3 cm. Echogenicity within normal limits. No mass visualized. There is mild pelvicaliectasis. Left Kidney: Length: 9.3 cm. Echogenicity within normal limits. There is a cyst in the lower pole left kidney medially measuring 1.0 x 0.8 x 0.9 cm. There is mild pelvicaliectasis. Abdominal aorta: No aneurysm visualized. Other findings: No demonstrable ascites. IMPRESSION: 1. Pancreas appears prominent and edematous without mass or inflammatory focus. The appearance of the pancreas does indicate a degree of acute pancreatitis. 2. Sludge and cholesterol crystals within the gallbladder. No gallstones evident. No gallbladder wall thickening or pericholecystic fluid. 3. Slight fullness of each renal collecting  system without focal area of obstruction identified on either side. 4.  Small left renal cyst. Electronically Signed   By: Lowella Grip III M.D.   On: 07/31/2017 12:03    Assessment: 46 year old female well known to our practice  with history of IDA, PUD, presenting from outside hospital with imaging concerning for pancreatitis and mildly elevated lipase, with inflammation possibly secondary to known PUD. Elevated LFTs most likely due to ischemic hepatitis. Mucomyst was ordered yesterday due to concern for accidental tylenol toxicity. However, she has not received any doses orally after discussion with pharmacy. Acetaminophen level less than 10 yesterday.  Multisystem organ failure on presentation.   Elevated LFTs: transaminases have fallen significantly from yesterdasy. INR improved and will continue Vit K daily for 3 days total. Oriented to self and place with asterixis noted on exam. Stool output not documented since admission.   Pyloric stenosis: was to have EGD with dilation Oct 2018. Once more alert, will need UGI to evaluate for gastric outlet obstruction and may need surgical consultation. Hgb holding steady.   Plan: PPI IV BID Vit K 10 mg IV X 3 days total Follow LFTs UGI when more alert to evaluate for outlet obstruction: anticipate on 10/3.  Follow CBC, transfuse as needed Discussed with pharmacy mucomyst dosing: she has not received any doses although ordered yesterday. I discussed with Poison Control. As LFTs have improved and INR, may hold off now.  Follow-up on pending hepatitis panel CMP, INR in morning Strict I/Os.    Annitta Needs, PhD, ANP-BC [facility]      LOS: 1 day    08/01/2017, 8:08 AM

## 2017-08-01 NOTE — Progress Notes (Signed)
08/01/2017 7:07 AM  Still waiting on pharmacy to reconcile home medications so we can get patient back on her appropriate home psychiatric medications.

## 2017-08-02 DIAGNOSIS — K8502 Idiopathic acute pancreatitis with infected necrosis: Secondary | ICD-10-CM

## 2017-08-02 LAB — HEMOGLOBIN AND HEMATOCRIT, BLOOD
HCT: 25.5 % — ABNORMAL LOW (ref 36.0–46.0)
HEMOGLOBIN: 8.6 g/dL — AB (ref 12.0–15.0)

## 2017-08-02 LAB — COMPREHENSIVE METABOLIC PANEL
ALT: 204 U/L — ABNORMAL HIGH (ref 14–54)
ANION GAP: 3 — AB (ref 5–15)
AST: 32 U/L (ref 15–41)
Albumin: 1.8 g/dL — ABNORMAL LOW (ref 3.5–5.0)
Alkaline Phosphatase: 120 U/L (ref 38–126)
BUN: 20 mg/dL (ref 6–20)
CHLORIDE: 118 mmol/L — AB (ref 101–111)
CO2: 20 mmol/L — AB (ref 22–32)
Calcium: 7.9 mg/dL — ABNORMAL LOW (ref 8.9–10.3)
Creatinine, Ser: 0.84 mg/dL (ref 0.44–1.00)
GFR calc non Af Amer: >60 mL/min (ref >60)
Glucose, Bld: 74 mg/dL (ref 65–99)
POTASSIUM: 3.9 mmol/L (ref 3.5–5.1)
SODIUM: 141 mmol/L (ref 135–145)
Total Bilirubin: 1.3 mg/dL — ABNORMAL HIGH (ref 0.3–1.2)
Total Protein: 4.2 g/dL — ABNORMAL LOW (ref 6.5–8.1)

## 2017-08-02 LAB — PROTIME-INR
INR: 1.3
PROTHROMBIN TIME: 16.1 s — AB (ref 11.4–15.2)

## 2017-08-02 LAB — CBC WITH DIFFERENTIAL/PLATELET
Basophils Absolute: 0 10*3/uL (ref 0.0–0.1)
Basophils Relative: 0 %
EOS ABS: 0.4 10*3/uL (ref 0.0–0.7)
Eosinophils Relative: 3 %
HCT: 22.3 % — ABNORMAL LOW (ref 36.0–46.0)
HEMOGLOBIN: 7.5 g/dL — AB (ref 12.0–15.0)
LYMPHS ABS: 3 10*3/uL (ref 0.7–4.0)
LYMPHS PCT: 22 %
MCH: 29.1 pg (ref 26.0–34.0)
MCHC: 33.6 g/dL (ref 30.0–36.0)
MCV: 86.4 fL (ref 78.0–100.0)
Monocytes Absolute: 1.6 10*3/uL — ABNORMAL HIGH (ref 0.1–1.0)
Monocytes Relative: 12 %
NEUTROS ABS: 8.7 10*3/uL — AB (ref 1.7–7.7)
NEUTROS PCT: 63 %
Platelets: 156 10*3/uL (ref 150–400)
RBC: 2.58 MIL/uL — AB (ref 3.87–5.11)
RDW: 18.8 % — ABNORMAL HIGH (ref 11.5–15.5)
WBC: 13.7 10*3/uL — AB (ref 4.0–10.5)

## 2017-08-02 LAB — MAGNESIUM: Magnesium: 2.1 mg/dL (ref 1.7–2.4)

## 2017-08-02 LAB — CK: CK TOTAL: 83 U/L (ref 38–234)

## 2017-08-02 LAB — LIPASE, BLOOD: Lipase: 67 U/L — ABNORMAL HIGH (ref 11–51)

## 2017-08-02 NOTE — Progress Notes (Signed)
Subjective:  Alert and oriented to person, place, and year. Per nursing staff, patient was mentally appropriate yesterday. Tolerating full liquids. No abdominal pain with breakfast. No vomiting. No BM in greater than 24 hours. Small stool with small amt of brbpr two evenings ago.   Objective: Vital signs in last 24 hours: Temp:  [98.2 F (36.8 C)] 98.2 F (36.8 C) (10/02 2000) Pulse Rate:  [83-112] 90 (10/03 0600) Resp:  [10-18] 15 (10/03 0600) BP: (84-144)/(55-131) 104/68 (10/03 0600) SpO2:  [92 %-100 %] 96 % (10/03 0600)   General:   Alert,  Well-developed, well-nourished, pleasant and cooperative in NAD Head:  Normocephalic and atraumatic. Eyes:  Sclera clear, no icterus.  Chest: CTA bilaterally without rales, rhonchi, crackles.    Heart:  Regular rate and rhythm; no murmurs, clicks, rubs,  or gallops. Abdomen:  Soft, nontender and nondistended. Normal bowel sounds, without guarding, and without rebound.   Extremities:  Without clubbing, deformity or edema. Neurologic:  Alert and  Oriented to person, place, year. Somewhat slow to respond initially.  grossly normal neurologically. Skin:  Intact without significant lesions or rashes. Psych:  Alert and cooperative. Normal mood and affect.  Intake/Output from previous day: 10/02 0701 - 10/03 0700 In: 3125 [I.V.:3075; IV Piggyback:50] Out: 2610 [Urine:2610] Intake/Output this shift: No intake/output data recorded.  Lab Results: CBC  Recent Labs  07/31/17 1140 08/01/17 0409 08/02/17 0403  WBC 21.5* 16.9* 13.7*  HGB 9.9* 9.6* 7.5*  HCT 29.3* 28.1* 22.3*  MCV 84.7 86.5 86.4  PLT 261 223 156   BMET  Recent Labs  07/31/17 1140 08/01/17 0409 08/02/17 0403  NA 145 144 141  K 3.2* 3.5 3.9  CL 114* 118* 118*  CO2 18* 19* 20*  GLUCOSE 105* 78 74  BUN 85* 51* 20  CREATININE 1.72* 1.35* 0.84  CALCIUM 8.3* 7.9* 7.9*   LFTs  Recent Labs  07/31/17 1140 08/01/17 0409 08/02/17 0403  BILITOT 2.6* 1.7* 1.3*  ALKPHOS  163* 127* 120  AST 128* 48* 32  ALT 664* 392* 204*  PROT 6.1* 5.3* 4.2*  ALBUMIN 2.7* 2.3* 1.8*    Recent Labs  07/31/17 1140 08/01/17 0409 08/02/17 0403  LIPASE 165* 95* 67*   PT/INR  Recent Labs  07/31/17 1140 08/01/17 0409 08/02/17 0403  LABPROT 22.6* 17.1* 16.1*  INR 2.01 1.41 1.30      Imaging Studies: US Abdomen Complete  Result Date: 07/31/2017 CLINICAL DATA:  Elevated liver enzymes and pancreatitis EXAM: ABDOMEN ULTRASOUND COMPLETE COMPARISON:  CT abdomen and pelvis July 31, 2017 FINDINGS: Gallbladder: Within the gallbladder, there are multiple tiny echogenic foci which may represent cholesterol crystals. There is moderate sludge in the gallbladder. No well-defined gallstones are seen. There is no appreciable gallbladder wall thickening or pericholecystic fluid. No sonographic Murphy sign noted by sonographer. Common bile duct: Diameter: 2 mm. There is no intrahepatic, common hepatic, or common bile duct dilatation. Liver: No focal lesion identified. Within normal limits in parenchymal echogenicity. Portal vein is patent on color Doppler imaging with normal direction of blood flow towards the liver. IVC: No abnormality visualized. Pancreas: Pancreas overall appears prominent and edematous without well-defined mass or fluid collection. There is no pancreatic duct dilatation. Spleen: Size and appearance within normal limits. Right Kidney: Length: 9.3 cm. Echogenicity within normal limits. No mass visualized. There is mild pelvicaliectasis. Left Kidney: Length: 9.3 cm. Echogenicity within normal limits. There is a cyst in the lower pole left kidney medially measuring 1.0 x 0.8 x 0.9 cm.  There is mild pelvicaliectasis. Abdominal aorta: No aneurysm visualized. Other findings: No demonstrable ascites. IMPRESSION: 1. Pancreas appears prominent and edematous without mass or inflammatory focus. The appearance of the pancreas does indicate a degree of acute pancreatitis. 2. Sludge and  cholesterol crystals within the gallbladder. No gallstones evident. No gallbladder wall thickening or pericholecystic fluid. 3. Slight fullness of each renal collecting system without focal area of obstruction identified on either side. 4.  Small left renal cyst. Electronically Signed   By: Lowella Grip III M.D.   On: 07/31/2017 12:03   US Ob Comp Less 14 Wks  Result Date: 07/06/2017 CLINICAL DATA:  Pregnant patient with 2 days of abdominal pain. EXAM: OBSTETRIC <14 WK [place] AND TRANSVAGINAL OB US DOPPLER ULTRASOUND OF OVARIES TECHNIQUE: Both transabdominal and transvaginal ultrasound examinations were performed for complete evaluation of the gestation as well as the maternal uterus, adnexal regions, and pelvic cul-de-sac. Transvaginal technique was performed to assess early pregnancy. Color and duplex Doppler ultrasound was utilized to evaluate blood flow to the ovaries. COMPARISON:  None. FINDINGS: Intrauterine gestational sac: None Maternal uterus/adnexae: The right ovary was not visualized. The left ovary is normal in appearance. Arterial and venous blood flow was seen in the left ovary. No free fluid in the pelvis. Pulsed Doppler evaluation of the left ovary demonstrates normal appearing low-resistance arterial and venous waveforms. IMPRESSION: 1. No IUP is identified. In the setting of pregnancy, this can represent early pregnancy, miscarriage, or ectopic pregnancy. Recommend clinical correlation and follow-up as clinically warranted. 2. The right ovary was not visualized. The left ovary is normal in appearance with arterial and venous blood flow seen in the left ovary. Electronically Signed   By: Dorise Bullion III M.D   On: 07/06/2017 19:47   US Ob Transvaginal  Result Date: 07/06/2017 CLINICAL DATA:  Pregnant patient with 2 days of abdominal pain. EXAM: OBSTETRIC <14 WK [place] AND TRANSVAGINAL OB US DOPPLER ULTRASOUND OF OVARIES TECHNIQUE: Both transabdominal and transvaginal ultrasound examinations  were performed for complete evaluation of the gestation as well as the maternal uterus, adnexal regions, and pelvic cul-de-sac. Transvaginal technique was performed to assess early pregnancy. Color and duplex Doppler ultrasound was utilized to evaluate blood flow to the ovaries. COMPARISON:  None. FINDINGS: Intrauterine gestational sac: None Maternal uterus/adnexae: The right ovary was not visualized. The left ovary is normal in appearance. Arterial and venous blood flow was seen in the left ovary. No free fluid in the pelvis. Pulsed Doppler evaluation of the left ovary demonstrates normal appearing low-resistance arterial and venous waveforms. IMPRESSION: 1. No IUP is identified. In the setting of pregnancy, this can represent early pregnancy, miscarriage, or ectopic pregnancy. Recommend clinical correlation and follow-up as clinically warranted. 2. The right ovary was not visualized. The left ovary is normal in appearance with arterial and venous blood flow seen in the left ovary. Electronically Signed   By: Dorise Bullion III M.D   On: 07/06/2017 19:47   US Pelvic Doppler (torsion R/o Or Mass Arterial Flow)  Result Date: 07/06/2017 CLINICAL DATA:  Pregnant patient with 2 days of abdominal pain. EXAM: OBSTETRIC <14 WK [place] AND TRANSVAGINAL OB US DOPPLER ULTRASOUND OF OVARIES TECHNIQUE: Both transabdominal and transvaginal ultrasound examinations were performed for complete evaluation of the gestation as well as the maternal uterus, adnexal regions, and pelvic cul-de-sac. Transvaginal technique was performed to assess early pregnancy. Color and duplex Doppler ultrasound was utilized to evaluate blood flow to the ovaries. COMPARISON:  None. FINDINGS: Intrauterine gestational  sac: None Maternal uterus/adnexae: The right ovary was not visualized. The left ovary is normal in appearance. Arterial and venous blood flow was seen in the left ovary. No free fluid in the pelvis. Pulsed Doppler evaluation of the left ovary  demonstrates normal appearing low-resistance arterial and venous waveforms. IMPRESSION: 1. No IUP is identified. In the setting of pregnancy, this can represent early pregnancy, miscarriage, or ectopic pregnancy. Recommend clinical correlation and follow-up as clinically warranted. 2. The right ovary was not visualized. The left ovary is normal in appearance with arterial and venous blood flow seen in the left ovary. Electronically Signed   By: Dorise Bullion III M.D   On: 07/06/2017 19:47   Dg Abdomen Acute W/chest  Result Date: 07/03/2017 CLINICAL DATA:  Nausea vomiting and weakness EXAM: DG ABDOMEN ACUTE W/ 1V CHEST COMPARISON:  Ultrasound 06/14/2017, CT 06/13/2017, chest radiograph 03/21/2017 FINDINGS: Single-view chest demonstrates a small irregular opacity in the right upper lobe. No pleural effusion. Normal cardiomediastinal silhouette. Supine and upright views of the abdomen demonstrate no free air beneath the diaphragm. Gaseous prominence of right mid abdominal small bowel with fluid levels but distal gas demonstrated. Moderate stool in the colon. Calcified phleboliths in the pelvis. IMPRESSION: 1. Small focus of irregular opacity in the right upper lobe, cannot exclude a small focus of inflammatory process or infiltrate 2. Gaseous prominence of right mid abdominal small bowel loops with fluid levels but overall nonobstructed gas pattern. Localized ileus could be considered. Electronically Signed   By: Donavan Foil M.D.   On: 07/03/2017 16:03  [2 weeks]   Assessment: 46 year old female well known to our practice with history of IDA, PUD, presenting from outside hospital with imaging concerning for pancreatitis and mildly elevated lipase. Elevated LFTs most likely due to ischemic hepatitis vs biliary. Mucomyst was ordered due to concern for accidental tylenol toxicity. However, she has not received any doses orally after discussion with pharmacy. Acetaminophen level less than 10 yesterday.  Multisystem organ failure on presentation, improving.   Elevated LFTs: transaminases have fallen significantly this admission. INR improved and will continue Vit K daily for 3 days total. Oriented to self and place and year. Stool output minimal as outlined with small brbpr. linzess ordered for today.    Pyloric stenosis: was to have EGD with dilation Oct 2018. Tolerating full liquids at this time. May be able to hold off on UGI and just plan on EGD with dilation as scheduled as outpatient.   Anemia: patient received 2 units of prbcs at [facility]. Hgb down again without significant GI bleeding this admission.   Plan: 1. Complete vitamin K.  2. Repeat CBC at noon. May require additional unit of prbcs.  3. Monitor for significant GI bleeding.  4. Continue ppi bid.   Laureen Ochs. Bernarda Caffey [facility] [phone] 10/3/20189:43 AM     LOS: 2 days

## 2017-08-02 NOTE — Progress Notes (Signed)
PROGRESS NOTE    Deanna Berry  LHT:[id number] DOB: Aug 17, 1971 DOA: 07/31/2017 PCP: Redmond School, MD     Brief Narrative:  46 year old woman admitted to the hospital from home on 10/1 due to being found down at home in the bathroom with blood on the toilet and on the floor. She has a history of a gastric ulcer. She was initially admitted to [facility] rocking him and was noticed to have a hemoglobin of 7 and was transfused 2 units of PRBCs. She had a CT scan of the abdomen that showed acute pancreatitis, UDS positive for methadone, found to have a UTI. She was transferred to [facility] for the purpose of GI consultation.   Assessment & Plan:   Principal Problem:   Pancreatitis, acute Active Problems:   Bipolar 1 disorder (HCC)   Substance abuse ([place])   Anemia   Hypothyroidism   PUD (peptic ulcer disease)   Hematuria   Tobacco dependence   Vomiting   GI bleeding   Elevated liver enzymes   Acute blood loss anemia   Fall at home   Abdominal pain, epigastric   Acute pancreatitis   UTI (urinary tract infection)   Leukocytosis   Acute pancreatitis -Lipase trending down, clinically improved, tolerating full liquid diet, will advance to a soft diet today.  Hematemesis -Prior to arrival. -GI is not planning on further inpatient evaluation, will follow up with them outpatient for an EGD.  Acute blood loss anemia -Received 2 units of PRBCs at [facility] rocking him prior to arrival to this hospital system. -Hemoglobin today is 7.5, we'll continue to monitor to see if transfusion is warranted.  Acute renal failure -Resolved with IV fluids, likely due to prerenal azotemia and acute tubular necrosis.  History of substance abuse -No narcotics will be prescribed on discharge.   DVT prophylaxis: SCDs Code Status: Full code Family Communication: Patient only Disposition Plan: Likely home in 24 hours  Consultants:   GI  Procedures:   None  Antimicrobials:  Anti-infectives      Start     Dose/Rate Route Frequency Ordered Stop   07/31/17 1200  cefTRIAXone (ROCEPHIN) 1 g in dextrose 5 % 50 mL IVPB     1 g 100 mL/hr over 30 Minutes Intravenous Every 24 hours 07/31/17 1059         Subjective: Feels much better, wants to eat solid food, minimal abdominal pain  Objective: Vitals:   08/02/17 0800 08/02/17 0900 08/02/17 1000 08/02/17 1100  BP: 100/90 96/60 108/70   Pulse: 100 (!) 105 (!) 102   Resp: 17 19 18 19   Temp:      TempSrc:      SpO2: 98% 99% 100%   Weight:      Height:        Intake/Output Summary (Last 24 hours) at 08/02/17 1809 Last data filed at 08/02/17 1600  Gross per 24 hour  Intake             3700 ml  Output             2050 ml  Net             1650 ml   Filed Weights   07/31/17 0840 07/31/17 1400  Weight: 44.5 kg (98 lb 1.7 oz) 44.5 kg (98 lb 1.7 oz)    Examination:  General exam: Alert, awake, oriented x 3 Respiratory system: Clear to auscultation. Respiratory effort normal. Cardiovascular system:RRR. No murmurs, rubs, gallops. Gastrointestinal system: Abdomen is  nondistended, soft and nontender. No organomegaly or masses felt. Normal bowel sounds heard. Central nervous system: Alert and oriented. No focal neurological deficits. Extremities: No C/C/E, +pedal pulses Skin: No rashes, lesions or ulcers Psychiatry: Judgement and insight appear normal. Mood & affect appropriate.     Data Reviewed: I have personally reviewed following labs and imaging studies  CBC:  Recent Labs Lab 07/31/17 1140 08/01/17 0409 08/02/17 0403 08/02/17 1231  WBC 21.5* 16.9* 13.7*  --   NEUTROABS  --  12.0* 8.7*  --   HGB 9.9* 9.6* 7.5* 8.6*  HCT 29.3* 28.1* 22.3* 25.5*  MCV 84.7 86.5 86.4  --   PLT 261 223 156  --    Basic Metabolic Panel:  Recent Labs Lab 07/31/17 1140 08/01/17 0409 08/02/17 0403  NA 145 144 141  K 3.2* 3.5 3.9  CL 114* 118* 118*  CO2 18* 19* 20*  GLUCOSE 105* 78 74  BUN 85* 51* 20  CREATININE 1.72* 1.35*  0.84  CALCIUM 8.3* 7.9* 7.9*  MG  --  3.2* 2.1   GFR: Estimated Creatinine Clearance: 58.8 mL/min (by C-G formula based on SCr of 0.84 mg/dL). Liver Function Tests:  Recent Labs Lab 07/31/17 1140 08/01/17 0409 08/02/17 0403  AST 128* 48* 32  ALT 664* 392* 204*  ALKPHOS 163* 127* 120  BILITOT 2.6* 1.7* 1.3*  PROT 6.1* 5.3* 4.2*  ALBUMIN 2.7* 2.3* 1.8*    Recent Labs Lab 07/31/17 1140 08/01/17 0409 08/02/17 0403  LIPASE 165* 95* 67*    Recent Labs Lab 07/31/17 1202 08/01/17 0409  AMMONIA 18 38*   Coagulation Profile:  Recent Labs Lab 07/31/17 1140 08/01/17 0409 08/02/17 0403  INR 2.01 1.41 1.30   Cardiac Enzymes:  Recent Labs Lab 07/31/17 1140 08/01/17 0409 08/02/17 0403  CKTOTAL 425* 198 83   BNP (last 3 results) No results for input(s): PROBNP in the last 8760 hours. HbA1C:  Recent Labs  07/31/17 1140  HGBA1C 5.1   CBG: No results for input(s): GLUCAP in the last 168 hours. Lipid Profile:  Recent Labs  07/31/17 1140 08/01/17 0409  CHOL 144 131  HDL 12* 18*  LDLCALC 60 67  TRIG 361* 232*  CHOLHDL 12.0 7.3   Thyroid Function Tests:  Recent Labs  07/31/17 1140 07/31/17 1300  TSH 0.056*  --   FREET4  --  0.82   Anemia Panel: No results for input(s): VITAMINB12, FOLATE, FERRITIN, TIBC, IRON, RETICCTPCT in the last 72 hours. Urine analysis:    Component Value Date/Time   COLORURINE YELLOW 07/31/2017 1040   APPEARANCEUR HAZY (A) 07/31/2017 1040   LABSPEC 1.015 07/31/2017 1040   PHURINE 5.0 07/31/2017 1040   GLUCOSEU NEGATIVE 07/31/2017 1040   HGBUR NEGATIVE 07/31/2017 1040   BILIRUBINUR NEGATIVE 07/31/2017 1040   KETONESUR NEGATIVE 07/31/2017 1040   PROTEINUR NEGATIVE 07/31/2017 1040   UROBILINOGEN 1.0 12/11/2012 1744   NITRITE NEGATIVE 07/31/2017 1040   LEUKOCYTESUR LARGE (A) 07/31/2017 1040   Sepsis Labs: @LABRCNTIP (procalcitonin:4,lacticidven:4)  ) Recent Results (from the past 240 hour(s))  MRSA PCR Screening      Status: None   Collection Time: 07/31/17 11:43 AM  Result Value Ref Range Status   MRSA by PCR NEGATIVE NEGATIVE Final    Comment:        The GeneXpert MRSA Assay (FDA approved for NASAL specimens only), is one component of a comprehensive MRSA colonization surveillance program. It is not intended to diagnose MRSA infection nor to guide or monitor treatment for MRSA infections.  Culture, blood (Routine X 2) w Reflex to ID Panel     Status: None (Preliminary result)   Collection Time: 07/31/17  2:52 PM  Result Value Ref Range Status   Specimen Description RIGHT ANTECUBITAL  Final   Special Requests   Final    BOTTLES DRAWN AEROBIC ONLY Blood Culture results may not be optimal due to an inadequate volume of blood received in culture bottles   Culture NO GROWTH 2 DAYS  Final   Report Status PENDING  Incomplete  Culture, blood (Routine X 2) w Reflex to ID Panel     Status: None (Preliminary result)   Collection Time: 07/31/17  2:52 PM  Result Value Ref Range Status   Specimen Description BLOOD LEFT HAND  Final   Special Requests   Final    BOTTLES DRAWN AEROBIC ONLY Blood Culture results may not be optimal due to an inadequate volume of blood received in culture bottles   Culture NO GROWTH 2 DAYS  Final   Report Status PENDING  Incomplete         Radiology Studies: No results found.      Scheduled Meds: . acetylcysteine  140 mg/kg (Order-Specific) Oral Once  . carbamazepine  200 mg Oral BID  . carbamazepine  400 mg Oral BID  . levothyroxine  50 mcg Oral QAC breakfast  . linaclotide  290 mcg Oral QAC breakfast  . pantoprazole  40 mg Oral BID AC  . QUEtiapine  300 mg Oral QHS  . vortioxetine HBr  20 mg Oral Daily   Continuous Infusions: . cefTRIAXone (ROCEPHIN)  IV Stopped (08/02/17 1316)  . lactated ringers with kcl 150 mL/hr at 08/02/17 1703  . phytonadione (VITAMIN K) IV Stopped (08/01/17 2218)     LOS: 2 days    Time spent: 35 minutes. Greater than 50% of  this time was spent in direct contact with the patient coordinating care.     Lelon Frohlich, MD [facility] Pager [phone]  If 7PM-7AM, please contact night-coverage [email or web address] Password [email or web address] 08/02/2017, 6:09 PM

## 2017-08-02 NOTE — Plan of Care (Signed)
Problem: Nutrition: Goal: Adequate nutrition will be maintained Outcome: Progressing Patient tolerating full liquid diet. MD transitioning her to soft diet for lunch

## 2017-08-03 DIAGNOSIS — K8500 Idiopathic acute pancreatitis without necrosis or infection: Secondary | ICD-10-CM

## 2017-08-03 DIAGNOSIS — D649 Anemia, unspecified: Secondary | ICD-10-CM

## 2017-08-03 LAB — HEMOGLOBIN AND HEMATOCRIT, BLOOD
HEMATOCRIT: 34 % — AB (ref 36.0–46.0)
HEMOGLOBIN: 11.6 g/dL — AB (ref 12.0–15.0)

## 2017-08-03 LAB — PROTIME-INR
INR: 1.22
PROTHROMBIN TIME: 15.3 s — AB (ref 11.4–15.2)

## 2017-08-03 LAB — COMPREHENSIVE METABOLIC PANEL
ALT: 149 U/L — ABNORMAL HIGH (ref 14–54)
ANION GAP: 8 (ref 5–15)
AST: 34 U/L (ref 15–41)
Albumin: 1.7 g/dL — ABNORMAL LOW (ref 3.5–5.0)
Alkaline Phosphatase: 120 U/L (ref 38–126)
BUN: 11 mg/dL (ref 6–20)
CHLORIDE: 113 mmol/L — AB (ref 101–111)
CO2: 20 mmol/L — AB (ref 22–32)
Calcium: 7.8 mg/dL — ABNORMAL LOW (ref 8.9–10.3)
Creatinine, Ser: 0.86 mg/dL (ref 0.44–1.00)
GFR calc non Af Amer: >60 mL/min (ref >60)
Glucose, Bld: 83 mg/dL (ref 65–99)
POTASSIUM: 3.6 mmol/L (ref 3.5–5.1)
SODIUM: 141 mmol/L (ref 135–145)
Total Bilirubin: 0.9 mg/dL (ref 0.3–1.2)
Total Protein: 4.1 g/dL — ABNORMAL LOW (ref 6.5–8.1)

## 2017-08-03 LAB — CBC WITH DIFFERENTIAL/PLATELET
Basophils Absolute: 0 10*3/uL (ref 0.0–0.1)
Basophils Relative: 0 %
EOS ABS: 0.3 10*3/uL (ref 0.0–0.7)
Eosinophils Relative: 3 %
HCT: 20.6 % — ABNORMAL LOW (ref 36.0–46.0)
HEMOGLOBIN: 6.8 g/dL — AB (ref 12.0–15.0)
LYMPHS ABS: 2.2 10*3/uL (ref 0.7–4.0)
LYMPHS PCT: 23 %
MCH: 28.7 pg (ref 26.0–34.0)
MCHC: 33 g/dL (ref 30.0–36.0)
MCV: 86.9 fL (ref 78.0–100.0)
Monocytes Absolute: 1.4 10*3/uL — ABNORMAL HIGH (ref 0.1–1.0)
Monocytes Relative: 14 %
NEUTROS ABS: 5.9 10*3/uL (ref 1.7–7.7)
NEUTROS PCT: 61 %
Platelets: 138 10*3/uL — ABNORMAL LOW (ref 150–400)
RBC: 2.37 MIL/uL — ABNORMAL LOW (ref 3.87–5.11)
RDW: 18.9 % — ABNORMAL HIGH (ref 11.5–15.5)
WBC: 9.7 10*3/uL (ref 4.0–10.5)

## 2017-08-03 LAB — PREPARE RBC (CROSSMATCH)

## 2017-08-03 LAB — ABO/RH: ABO/RH(D): A POS

## 2017-08-03 LAB — CK: CK TOTAL: 71 U/L (ref 38–234)

## 2017-08-03 LAB — MAGNESIUM: Magnesium: 1.6 mg/dL — ABNORMAL LOW (ref 1.7–2.4)

## 2017-08-03 LAB — LIPASE, BLOOD: Lipase: 72 U/L — ABNORMAL HIGH (ref 11–51)

## 2017-08-03 MED ORDER — DIPHENHYDRAMINE HCL 25 MG PO CAPS
25.0000 mg | ORAL_CAPSULE | Freq: Once | ORAL | Status: AC
  Administered 2017-08-03: 25 mg via ORAL
  Filled 2017-08-03: qty 1

## 2017-08-03 MED ORDER — ACETAMINOPHEN 325 MG PO TABS
650.0000 mg | ORAL_TABLET | Freq: Once | ORAL | Status: AC
  Administered 2017-08-03: 650 mg via ORAL
  Filled 2017-08-03: qty 2

## 2017-08-03 MED ORDER — SODIUM CHLORIDE 0.9 % IV SOLN
Freq: Once | INTRAVENOUS | Status: AC
  Administered 2017-08-03: 11:00:00 via INTRAVENOUS

## 2017-08-03 MED ORDER — SODIUM CHLORIDE 0.9 % IV SOLN: INTRAVENOUS | Status: DC

## 2017-08-03 NOTE — Progress Notes (Signed)
PROGRESS NOTE    Deanna Berry  XTG:[id number] DOB: [birth year] DOA: 07/31/2017 PCP: Redmond School, MD     Brief Narrative:  46 year old woman admitted to the hospital from home on 10/1 due to being found down at home in the bathroom with blood on the toilet and on the floor. She has a history of a gastric ulcer. She was initially admitted to [facility] rocking him and was noticed to have a hemoglobin of 7 and was transfused 2 units of PRBCs. She had a CT scan of the abdomen that showed acute pancreatitis, UDS positive for methadone, found to have a UTI. She was transferred to [facility] for the purpose of GI consultation. On 10/4 has reported 3 bloody BMs and Hb has dropped to 6.8 and order has been given to transfuse   Assessment & Plan:   Principal Problem:   Pancreatitis, acute Active Problems:   Bipolar 1 disorder (HCC)   Substance abuse (HCC)   Anemia   Hypothyroidism   PUD (peptic ulcer disease)   Hematuria   Tobacco dependence   Vomiting   GI bleeding   Elevated liver enzymes   Acute blood loss anemia   Fall at home   Abdominal pain, epigastric   Acute pancreatitis   UTI (urinary tract infection)   Leukocytosis   Acute pancreatitis -Lipase trending down, clinically improved, tolerating diet without issues.  Hematemesis -Prior to arrival. -She now has reported 3 black bowel movements today. -Seen by GI who is planning on performing an EGD tomorrow.  Acute blood loss anemia -Received 2 units of PRBCs at [facility] rocking him prior to arrival to this hospital system. -Hemoglobin today is 6.8, will be transfused an additional 2 units to total 4 units of PRBCs this admission.  Acute renal failure -Resolved with IV fluids, likely due to prerenal azotemia and acute tubular necrosis.  History of substance abuse -No narcotics will be prescribed on discharge.   DVT prophylaxis: SCDs Code Status: Full code Family Communication: Patient only Disposition Plan: Likely home  in 24-48 hours  Consultants:   GI  Procedures:   None  Antimicrobials:  Anti-infectives    Start     Dose/Rate Route Frequency Ordered Stop   07/31/17 1200  cefTRIAXone (ROCEPHIN) 1 g in dextrose 5 % 50 mL IVPB     1 g 100 mL/hr over 30 Minutes Intravenous Every 24 hours 07/31/17 1059         Subjective: No complaints, states she has had 3 black bowel movements today.  Objective: Vitals:   08/03/17 1114 08/03/17 1340 08/03/17 1445 08/03/17 1516  BP: 128/82 (!) 138/92 124/83 (!) 120/92  Pulse: 97 92 93 87  Resp: 18 18 18 18   Temp: 98.4 F (36.9 C) 97.7 F (36.5 C) 98.3 F (36.8 C) 98 F (36.7 C)  TempSrc: Oral Oral Oral Oral  SpO2: 100% 100% 100% 98%  Weight:      Height:        Intake/Output Summary (Last 24 hours) at 08/03/17 1551 Last data filed at 08/03/17 1340  Gross per 24 hour  Intake           3774.5 ml  Output             2500 ml  Net           1274.5 ml   Filed Weights   07/31/17 0840 07/31/17 1400  Weight: 44.5 kg (98 lb 1.7 oz) 44.5 kg (98 lb 1.7 oz)  Examination:  General exam: Alert, awake, oriented x 3 Respiratory system: Clear to auscultation. Respiratory effort normal. Cardiovascular system:RRR. No murmurs, rubs, gallops. Gastrointestinal system: Abdomen is nondistended, soft and nontender. No organomegaly or masses felt. Normal bowel sounds heard. Central nervous system: Alert and oriented. No focal neurological deficits. Extremities: No C/C/E, +pedal pulses Skin: No rashes, lesions or ulcers Psychiatry: Judgement and insight appear normal. Mood & affect appropriate.      Data Reviewed: I have personally reviewed following labs and imaging studies  CBC:  Recent Labs Lab 07/31/17 1140 08/01/17 0409 08/02/17 0403 08/02/17 1231 08/03/17 0459  WBC 21.5* 16.9* 13.7*  --  9.7  NEUTROABS  --  12.0* 8.7*  --  5.9  HGB 9.9* 9.6* 7.5* 8.6* 6.8*  HCT 29.3* 28.1* 22.3* 25.5* 20.6*  MCV 84.7 86.5 86.4  --  86.9  PLT 261 223 156   --  696*   Basic Metabolic Panel:  Recent Labs Lab 07/31/17 1140 08/01/17 0409 08/02/17 0403 08/03/17 0459  NA 145 144 141 141  K 3.2* 3.5 3.9 3.6  CL 114* 118* 118* 113*  CO2 18* 19* 20* 20*  GLUCOSE 105* 78 74 83  BUN 85* 51* 20 11  CREATININE 1.72* 1.35* 0.84 0.86  CALCIUM 8.3* 7.9* 7.9* 7.8*  MG  --  3.2* 2.1 1.6*   GFR: Estimated Creatinine Clearance: 57.4 mL/min (by C-G formula based on SCr of 0.86 mg/dL). Liver Function Tests:  Recent Labs Lab 07/31/17 1140 08/01/17 0409 08/02/17 0403 08/03/17 0459  AST 128* 48* 32 34  ALT 664* 392* 204* 149*  ALKPHOS 163* 127* 120 120  BILITOT 2.6* 1.7* 1.3* 0.9  PROT 6.1* 5.3* 4.2* 4.1*  ALBUMIN 2.7* 2.3* 1.8* 1.7*    Recent Labs Lab 07/31/17 1140 08/01/17 0409 08/02/17 0403 08/03/17 0459  LIPASE 165* 95* 67* 72*    Recent Labs Lab 07/31/17 1202 08/01/17 0409  AMMONIA 18 38*   Coagulation Profile:  Recent Labs Lab 07/31/17 1140 08/01/17 0409 08/02/17 0403 08/03/17 0459  INR 2.01 1.41 1.30 1.22   Cardiac Enzymes:  Recent Labs Lab 07/31/17 1140 08/01/17 0409 08/02/17 0403 08/03/17 0459  CKTOTAL 425* 198 83 71   BNP (last 3 results) No results for input(s): PROBNP in the last 8760 hours. HbA1C: No results for input(s): HGBA1C in the last 72 hours. CBG: No results for input(s): GLUCAP in the last 168 hours. Lipid Profile:  Recent Labs  08/01/17 0409  CHOL 131  HDL 18*  LDLCALC 67  TRIG 232*  CHOLHDL 7.3   Thyroid Function Tests: No results for input(s): TSH, T4TOTAL, FREET4, T3FREE, THYROIDAB in the last 72 hours. Anemia Panel: No results for input(s): VITAMINB12, FOLATE, FERRITIN, TIBC, IRON, RETICCTPCT in the last 72 hours. Urine analysis:    Component Value Date/Time   COLORURINE YELLOW 07/31/2017 1040   APPEARANCEUR HAZY (A) 07/31/2017 1040   LABSPEC 1.015 07/31/2017 1040   PHURINE 5.0 07/31/2017 1040   GLUCOSEU NEGATIVE 07/31/2017 1040   HGBUR NEGATIVE 07/31/2017 1040    BILIRUBINUR NEGATIVE 07/31/2017 1040   KETONESUR NEGATIVE 07/31/2017 1040   PROTEINUR NEGATIVE 07/31/2017 1040   UROBILINOGEN 1.0 12/11/2012 1744   NITRITE NEGATIVE 07/31/2017 1040   LEUKOCYTESUR LARGE (A) 07/31/2017 1040   Sepsis Labs: @LABRCNTIP (procalcitonin:4,lacticidven:4)  ) Recent Results (from the past 240 hour(s))  MRSA PCR Screening     Status: None   Collection Time: 07/31/17 11:43 AM  Result Value Ref Range Status   MRSA by PCR NEGATIVE NEGATIVE Final  Comment:        The GeneXpert MRSA Assay (FDA approved for NASAL specimens only), is one component of a comprehensive MRSA colonization surveillance program. It is not intended to diagnose MRSA infection nor to guide or monitor treatment for MRSA infections.   Culture, blood (Routine X 2) w Reflex to ID Panel     Status: None (Preliminary result)   Collection Time: 07/31/17  2:52 PM  Result Value Ref Range Status   Specimen Description RIGHT ANTECUBITAL  Final   Special Requests   Final    BOTTLES DRAWN AEROBIC ONLY Blood Culture results may not be optimal due to an inadequate volume of blood received in culture bottles   Culture NO GROWTH 3 DAYS  Final   Report Status PENDING  Incomplete  Culture, blood (Routine X 2) w Reflex to ID Panel     Status: None (Preliminary result)   Collection Time: 07/31/17  2:52 PM  Result Value Ref Range Status   Specimen Description BLOOD LEFT HAND  Final   Special Requests   Final    BOTTLES DRAWN AEROBIC ONLY Blood Culture results may not be optimal due to an inadequate volume of blood received in culture bottles   Culture NO GROWTH 3 DAYS  Final   Report Status PENDING  Incomplete         Radiology Studies: No results found.      Scheduled Meds: . acetylcysteine  140 mg/kg (Order-Specific) Oral Once  . carbamazepine  200 mg Oral BID  . carbamazepine  400 mg Oral BID  . levothyroxine  50 mcg Oral QAC breakfast  . linaclotide  290 mcg Oral QAC breakfast  .  pantoprazole  40 mg Oral BID AC  . QUEtiapine  300 mg Oral QHS  . vortioxetine HBr  20 mg Oral Daily   Continuous Infusions: . cefTRIAXone (ROCEPHIN)  IV Stopped (08/03/17 1320)  . lactated ringers with kcl 150 mL/hr at 08/03/17 1250     LOS: 3 days    Time spent: 35 minutes. Greater than 50% of this time was spent in direct contact with the patient coordinating care.     Lelon Frohlich, MD [facility] Pager [phone]  If 7PM-7AM, please contact night-coverage [email or web address] Password [email or web address] 08/03/2017, 3:51 PM

## 2017-08-03 NOTE — Progress Notes (Signed)
CRITICAL VALUE ALERT  Critical Value: hemoglobin 6.8  Date & Time Notied: 08-03-17 0615  Provider Notified: dr. Maudie Mercury  Orders Received/Actions taken: Dr. Maudie Mercury notified, will write orders on patient.

## 2017-08-03 NOTE — Progress Notes (Signed)
Patient reports 4 bloody BMs between 1300 and now, MD notified.

## 2017-08-03 NOTE — Progress Notes (Signed)
Subjective:  Rested well. Tolerating diet with abd pain or n/v. Denies melena, brbpr. bm yesterday.  Objective: Vital signs in last 24 hours: Temp:  [98.7 F (37.1 C)] 98.7 F (37.1 C) (10/04 0500) Pulse Rate:  [95-102] 95 (10/04 0500) Resp:  [18-19] 18 (10/04 0500) BP: (106-110)/(64-75) 110/64 (10/04 0500) SpO2:  [97 %-100 %] 98 % (10/04 0500)   General:   Alert,  Well-developed, well-nourished, pleasant and cooperative in NAD Head:  Normocephalic and atraumatic. Eyes:  Sclera clear, no icterus.  Abdomen:  Soft, nontender and nondistended.  Normal bowel sounds, without guarding, and without rebound.   Extremities:  Without clubbing, deformity or edema. Neurologic:  Alert and  oriented x4;  grossly normal neurologically. Skin:  Intact without significant lesions or rashes. Psych:  Alert and cooperative. Normal mood and affect.  Intake/Output from previous day: 10/03 0701 - 10/04 0700 In: 3910 [P.O.:360; I.V.:3450; IV Piggyback:100] Out: 1600 [Urine:1600] Intake/Output this shift: No intake/output data recorded.  Lab Results: CBC  Recent Labs  08/01/17 0409 08/02/17 0403 08/02/17 1231 08/03/17 0459  WBC 16.9* 13.7*  --  9.7  HGB 9.6* 7.5* 8.6* 6.8*  HCT 28.1* 22.3* 25.5* 20.6*  MCV 86.5 86.4  --  86.9  PLT 223 156  --  138*   BMET  Recent Labs  08/01/17 0409 08/02/17 0403 08/03/17 0459  NA 144 141 141  K 3.5 3.9 3.6  CL 118* 118* 113*  CO2 19* 20* 20*  GLUCOSE 78 74 83  BUN 51* 20 11  CREATININE 1.35* 0.84 0.86  CALCIUM 7.9* 7.9* 7.8*   LFTs  Recent Labs  08/01/17 0409 08/02/17 0403 08/03/17 0459  BILITOT 1.7* 1.3* 0.9  ALKPHOS 127* 120 120  AST 48* 32 34  ALT 392* 204* 149*  PROT 5.3* 4.2* 4.1*  ALBUMIN 2.3* 1.8* 1.7*    Recent Labs  08/01/17 0409 08/02/17 0403 08/03/17 0459  LIPASE 95* 67* 72*   PT/INR  Recent Labs  08/01/17 0409 08/02/17 0403 08/03/17 0459  LABPROT 17.1* 16.1* 15.3*  INR 1.41 1.30 1.22       Imaging Studies: US Abdomen Complete  Result Date: 07/31/2017 CLINICAL DATA:  Elevated liver enzymes and pancreatitis EXAM: ABDOMEN ULTRASOUND COMPLETE COMPARISON:  CT abdomen and pelvis July 31, 2017 FINDINGS: Gallbladder: Within the gallbladder, there are multiple tiny echogenic foci which may represent cholesterol crystals. There is moderate sludge in the gallbladder. No well-defined gallstones are seen. There is no appreciable gallbladder wall thickening or pericholecystic fluid. No sonographic Murphy sign noted by sonographer. Common bile duct: Diameter: 2 mm. There is no intrahepatic, common hepatic, or common bile duct dilatation. Liver: No focal lesion identified. Within normal limits in parenchymal echogenicity. Portal vein is patent on color Doppler imaging with normal direction of blood flow towards the liver. IVC: No abnormality visualized. Pancreas: Pancreas overall appears prominent and edematous without well-defined mass or fluid collection. There is no pancreatic duct dilatation. Spleen: Size and appearance within normal limits. Right Kidney: Length: 9.3 cm. Echogenicity within normal limits. No mass visualized. There is mild pelvicaliectasis. Left Kidney: Length: 9.3 cm. Echogenicity within normal limits. There is a cyst in the lower pole left kidney medially measuring 1.0 x 0.8 x 0.9 cm. There is mild pelvicaliectasis. Abdominal aorta: No aneurysm visualized. Other findings: No demonstrable ascites. IMPRESSION: 1. Pancreas appears prominent and edematous without mass or inflammatory focus. The appearance of the pancreas does indicate a degree of acute pancreatitis. 2. Sludge and cholesterol  crystals within the gallbladder. No gallstones evident. No gallbladder wall thickening or pericholecystic fluid. 3. Slight fullness of each renal collecting system without focal area of obstruction identified on either side. 4.  Small left renal cyst. Electronically Signed   By: Lowella Grip III M.D.    On: 07/31/2017 12:03   US Ob Comp Less 14 Wks  Result Date: 07/06/2017 CLINICAL DATA:  Pregnant patient with 2 days of abdominal pain. EXAM: OBSTETRIC <14 WK [place] AND TRANSVAGINAL OB US DOPPLER ULTRASOUND OF OVARIES TECHNIQUE: Both transabdominal and transvaginal ultrasound examinations were performed for complete evaluation of the gestation as well as the maternal uterus, adnexal regions, and pelvic cul-de-sac. Transvaginal technique was performed to assess early pregnancy. Color and duplex Doppler ultrasound was utilized to evaluate blood flow to the ovaries. COMPARISON:  None. FINDINGS: Intrauterine gestational sac: None Maternal uterus/adnexae: The right ovary was not visualized. The left ovary is normal in appearance. Arterial and venous blood flow was seen in the left ovary. No free fluid in the pelvis. Pulsed Doppler evaluation of the left ovary demonstrates normal appearing low-resistance arterial and venous waveforms. IMPRESSION: 1. No IUP is identified. In the setting of pregnancy, this can represent early pregnancy, miscarriage, or ectopic pregnancy. Recommend clinical correlation and follow-up as clinically warranted. 2. The right ovary was not visualized. The left ovary is normal in appearance with arterial and venous blood flow seen in the left ovary. Electronically Signed   By: Dorise Bullion III M.D   On: 07/06/2017 19:47   US Ob Transvaginal  Result Date: 07/06/2017 CLINICAL DATA:  Pregnant patient with 2 days of abdominal pain. EXAM: OBSTETRIC <14 WK [place] AND TRANSVAGINAL OB US DOPPLER ULTRASOUND OF OVARIES TECHNIQUE: Both transabdominal and transvaginal ultrasound examinations were performed for complete evaluation of the gestation as well as the maternal uterus, adnexal regions, and pelvic cul-de-sac. Transvaginal technique was performed to assess early pregnancy. Color and duplex Doppler ultrasound was utilized to evaluate blood flow to the ovaries. COMPARISON:  None. FINDINGS: Intrauterine  gestational sac: None Maternal uterus/adnexae: The right ovary was not visualized. The left ovary is normal in appearance. Arterial and venous blood flow was seen in the left ovary. No free fluid in the pelvis. Pulsed Doppler evaluation of the left ovary demonstrates normal appearing low-resistance arterial and venous waveforms. IMPRESSION: 1. No IUP is identified. In the setting of pregnancy, this can represent early pregnancy, miscarriage, or ectopic pregnancy. Recommend clinical correlation and follow-up as clinically warranted. 2. The right ovary was not visualized. The left ovary is normal in appearance with arterial and venous blood flow seen in the left ovary. Electronically Signed   By: Dorise Bullion III M.D   On: 07/06/2017 19:47   US Pelvic Doppler (torsion R/o Or Mass Arterial Flow)  Result Date: 07/06/2017 CLINICAL DATA:  Pregnant patient with 2 days of abdominal pain. EXAM: OBSTETRIC <14 WK [place] AND TRANSVAGINAL OB US DOPPLER ULTRASOUND OF OVARIES TECHNIQUE: Both transabdominal and transvaginal ultrasound examinations were performed for complete evaluation of the gestation as well as the maternal uterus, adnexal regions, and pelvic cul-de-sac. Transvaginal technique was performed to assess early pregnancy. Color and duplex Doppler ultrasound was utilized to evaluate blood flow to the ovaries. COMPARISON:  None. FINDINGS: Intrauterine gestational sac: None Maternal uterus/adnexae: The right ovary was not visualized. The left ovary is normal in appearance. Arterial and venous blood flow was seen in the left ovary. No free fluid in the pelvis. Pulsed Doppler evaluation of the left ovary demonstrates  normal appearing low-resistance arterial and venous waveforms. IMPRESSION: 1. No IUP is identified. In the setting of pregnancy, this can represent early pregnancy, miscarriage, or ectopic pregnancy. Recommend clinical correlation and follow-up as clinically warranted. 2. The right ovary was not visualized.  The left ovary is normal in appearance with arterial and venous blood flow seen in the left ovary. Electronically Signed   By: Dorise Bullion III M.D   On: 07/06/2017 19:47  [2 weeks]   Assessment:  46 year old female well known to our practice with history of IDA, PUD, presenting from outside hospital with imaging concerning for pancreatitis and mildly elevated lipase. Elevated LFTs most likely due to ischemic hepatitis vs biliary. ?etiology of pancreatitis. Patient denies etoh. ?biliary. Trig over 300 here. Mucomyst was ordered due to concern for accidental tylenol toxicity. However, she has not received any doses orally after discussion with pharmacy. Acetaminophen level less than 10 yesterday. Multisystem organ failure on presentation, improving.   Elevated LFTs: transaminases have fallen significantly this admission. INR improved and Vit K daily for 3 days.   Pyloric stenosis: was to have EGD with dilation Oct 2018. Tolerating soft diet at this time. May be able to hold off on UGI and just plan on EGD with dilation as scheduled as outpatient.   Anemia: patient received 2 units of prbcs at [facility]. Hgb down again without significant GI bleeding this admission. Stool output minimal as outlined with small brbpr reported a couple of days ago. Linzess given yesterday. BM yesterday. Patient denies melena or brbpr yesterday. Abdominal exam benign and unlikely intraabdominal bleeding.       Plan: 1. Continue PPI BID.  2. Agree with transfusion.  3. Further recommendations to follow. To discuss change in Hgb with Dr. Gala Romney. EGD not likely to add additional information if no signs of UGI bleeding.   Laureen Ochs. Bernarda Caffey [facility] [phone] 10/4/20189:14 AM     LOS: 3 days

## 2017-08-04 ENCOUNTER — Inpatient Hospital Stay (HOSPITAL_COMMUNITY): Payer: BLUE CROSS/BLUE SHIELD | Admitting: Anesthesiology

## 2017-08-04 ENCOUNTER — Encounter (HOSPITAL_COMMUNITY)
Admission: AD | Disposition: A | Discharge status: Home / Self Care | Payer: Self-pay | Source: Other Acute Inpatient Hospital | Attending: Internal Medicine

## 2017-08-04 ENCOUNTER — Encounter (HOSPITAL_COMMUNITY): Payer: Self-pay

## 2017-08-04 DIAGNOSIS — K921 Melena: Secondary | ICD-10-CM

## 2017-08-04 DIAGNOSIS — K254 Chronic or unspecified gastric ulcer with hemorrhage: Secondary | ICD-10-CM | POA: Diagnosis not present

## 2017-08-04 DIAGNOSIS — K259 Gastric ulcer, unspecified as acute or chronic, without hemorrhage or perforation: Secondary | ICD-10-CM

## 2017-08-04 HISTORY — PX: ESOPHAGOGASTRODUODENOSCOPY (EGD) WITH PROPOFOL: SHX5813

## 2017-08-04 HISTORY — PX: BIOPSY: SHX5522

## 2017-08-04 LAB — TYPE AND SCREEN
ABO/RH(D): A POS
ANTIBODY SCREEN: NEGATIVE
UNIT DIVISION: 0
UNIT DIVISION: 0

## 2017-08-04 LAB — COMPREHENSIVE METABOLIC PANEL
ALBUMIN: 1.8 g/dL — AB (ref 3.5–5.0)
ALK PHOS: 110 U/L (ref 38–126)
ALT: 123 U/L — AB (ref 14–54)
ANION GAP: 11 (ref 5–15)
AST: 21 U/L (ref 15–41)
BILIRUBIN TOTAL: 1 mg/dL (ref 0.3–1.2)
BUN: 7 mg/dL (ref 6–20)
CALCIUM: 7.7 mg/dL — AB (ref 8.9–10.3)
CO2: 19 mmol/L — AB (ref 22–32)
CREATININE: 0.75 mg/dL (ref 0.44–1.00)
Chloride: 110 mmol/L (ref 101–111)
GFR calc non Af Amer: >60 mL/min (ref >60)
GLUCOSE: 79 mg/dL (ref 65–99)
Potassium: 3.5 mmol/L (ref 3.5–5.1)
SODIUM: 140 mmol/L (ref 135–145)
TOTAL PROTEIN: 4.4 g/dL — AB (ref 6.5–8.1)

## 2017-08-04 LAB — CBC WITH DIFFERENTIAL/PLATELET
BASOS PCT: 0 %
Basophils Absolute: 0 10*3/uL (ref 0.0–0.1)
EOS ABS: 0.3 10*3/uL (ref 0.0–0.7)
Eosinophils Relative: 2 %
HEMATOCRIT: 30.7 % — AB (ref 36.0–46.0)
HEMOGLOBIN: 10.5 g/dL — AB (ref 12.0–15.0)
Lymphocytes Relative: 15 %
Lymphs Abs: 1.9 10*3/uL (ref 0.7–4.0)
MCH: 29.2 pg (ref 26.0–34.0)
MCHC: 34.2 g/dL (ref 30.0–36.0)
MCV: 85.5 fL (ref 78.0–100.0)
MONO ABS: 1.5 10*3/uL — AB (ref 0.1–1.0)
MONOS PCT: 13 %
Neutro Abs: 8.5 10*3/uL — ABNORMAL HIGH (ref 1.7–7.7)
Neutrophils Relative %: 70 %
Platelets: 128 10*3/uL — ABNORMAL LOW (ref 150–400)
RBC: 3.59 MIL/uL — ABNORMAL LOW (ref 3.87–5.11)
RDW: 16.7 % — ABNORMAL HIGH (ref 11.5–15.5)
WBC: 12.2 10*3/uL — ABNORMAL HIGH (ref 4.0–10.5)

## 2017-08-04 LAB — KOH PREP

## 2017-08-04 LAB — BPAM RBC
BLOOD PRODUCT EXPIRATION DATE: 201810232359
BLOOD PRODUCT EXPIRATION DATE: 201810232359
ISSUE DATE / TIME: 201810041048
ISSUE DATE / TIME: 201810041445
UNIT TYPE AND RH: 6200
Unit Type and Rh: 6200

## 2017-08-04 LAB — PROTIME-INR
INR: 1.14
Prothrombin Time: 14.5 seconds (ref 11.4–15.2)

## 2017-08-04 LAB — LIPASE, BLOOD: Lipase: 68 U/L — ABNORMAL HIGH (ref 11–51)

## 2017-08-04 LAB — CK: Total CK: 74 U/L (ref 38–234)

## 2017-08-04 SURGERY — ESOPHAGOGASTRODUODENOSCOPY (EGD) WITH PROPOFOL
Anesthesia: Monitor Anesthesia Care

## 2017-08-04 MED ORDER — LIDOCAINE VISCOUS 2 % MT SOLN
15.0000 mL | Freq: Once | OROMUCOSAL | Status: AC
  Administered 2017-08-04: 15 mL via OROMUCOSAL

## 2017-08-04 MED ORDER — SUCRALFATE 1 GM/10ML PO SUSP
1.0000 g | Freq: Two times a day (BID) | ORAL | Status: DC
  Administered 2017-08-04 – 2017-08-05 (×3): 1 g via ORAL
  Filled 2017-08-04 (×3): qty 10

## 2017-08-04 MED ORDER — LIDOCAINE VISCOUS 2 % MT SOLN
OROMUCOSAL | Status: AC
  Filled 2017-08-04: qty 15

## 2017-08-04 MED ORDER — LIDOCAINE HCL (CARDIAC) 10 MG/ML IV SOLN
INTRAVENOUS | Status: DC | PRN
  Administered 2017-08-04: 40 mg via INTRAVENOUS

## 2017-08-04 MED ORDER — PROPOFOL 500 MG/50ML IV EMUL
INTRAVENOUS | Status: DC | PRN
  Administered 2017-08-04: 150 ug/kg/min via INTRAVENOUS

## 2017-08-04 MED ORDER — FENTANYL CITRATE (PF) 100 MCG/2ML IJ SOLN
25.0000 ug | Freq: Once | INTRAMUSCULAR | Status: AC
  Administered 2017-08-04: 25 ug via INTRAVENOUS

## 2017-08-04 MED ORDER — LIDOCAINE HCL (PF) 1 % IJ SOLN
INTRAMUSCULAR | Status: AC
  Filled 2017-08-04: qty 5

## 2017-08-04 MED ORDER — MIDAZOLAM HCL 2 MG/2ML IJ SOLN
1.0000 mg | INTRAMUSCULAR | Status: DC
  Administered 2017-08-04: 2 mg via INTRAVENOUS

## 2017-08-04 MED ORDER — SUCRALFATE 1 GM/10ML PO SUSP: 1.0000 g | Freq: Three times a day (TID) | ORAL | Status: DC

## 2017-08-04 MED ORDER — LACTATED RINGERS IV SOLN
INTRAVENOUS | Status: DC
  Administered 2017-08-04: 13:00:00 via INTRAVENOUS

## 2017-08-04 MED ORDER — PROPOFOL 10 MG/ML IV BOLUS
INTRAVENOUS | Status: AC
  Filled 2017-08-04: qty 20

## 2017-08-04 MED ORDER — FENTANYL CITRATE (PF) 100 MCG/2ML IJ SOLN
INTRAMUSCULAR | Status: AC
  Filled 2017-08-04: qty 2

## 2017-08-04 MED ORDER — MIDAZOLAM HCL 2 MG/2ML IJ SOLN
INTRAMUSCULAR | Status: AC
  Filled 2017-08-04: qty 2

## 2017-08-04 NOTE — Transfer of Care (Signed)
Immediate Anesthesia Transfer of Care Note  Patient: Deanna Berry  Procedure(s) Performed: ESOPHAGOGASTRODUODENOSCOPY (EGD) WITH PROPOFOL (N/A ) BIOPSY  Patient Location: PACU  Anesthesia Type:MAC  Level of Consciousness: drowsy  Airway & Oxygen Therapy: Patient Spontanous Breathing and Patient connected to nasal cannula oxygen  Post-op Assessment: Report given to RN and Post -op Vital signs reviewed and stable  Post vital signs: Reviewed and stable  Last Vitals:  Vitals:   08/04/17 1305 08/04/17 1310  BP:    Pulse:    Resp: 16 16  Temp:    SpO2: 98% 97%    Last Pain:  Vitals:   08/04/17 1225  TempSrc: Oral  PainSc:       Patients Stated Pain Goal: 0 (18/86/77 3736)  Complications: No apparent anesthesia complications

## 2017-08-04 NOTE — Care Management Note (Signed)
Case Management Note  Patient Details  Name: Deanna Berry MRN: [id number] Date of Birth: [birth year]  Subjective/Objective:                  Admitted with pancreatitis. Chart reviewed for CM needs. Pt is from home with family. She has PCP, transportation and insurance with drug coverage. She is ind in room. No HH or DME needs pta.   Action/Plan: Anticipate DC home with self care. No CM needs noted. CM may be consulted if needs arise.   Expected Discharge Date:      08/04/2017            Expected Discharge Plan:  Home/Self Care  In-House Referral:  NA  Discharge planning Services  CM Consult  Post Acute Care Choice:  NA Choice offered to:  NA  Status of Service:  Completed, signed off  Sherald Barge, RN 08/04/2017, 2:18 PM

## 2017-08-04 NOTE — Anesthesia Preprocedure Evaluation (Signed)
Anesthesia Evaluation  Patient identified by MRN, date of birth, ID band Patient awake    Reviewed: Allergy & Precautions, NPO status , Patient's Chart, lab work & pertinent test results  Airway Mallampati: II  TM Distance: >3 FB Neck ROM: Full    Dental no notable dental hx. (+) Poor Dentition, Missing   Pulmonary Current Smoker,    Pulmonary exam normal breath sounds clear to auscultation       Cardiovascular negative cardio ROS Normal cardiovascular exam Rhythm:Regular Rate:Normal     Neuro/Psych  Headaches, PSYCHIATRIC DISORDERS Anxiety Depression Bipolar Disorder negative neurological ROS     GI/Hepatic PUD, (+)     substance abuse  ,   Endo/Other  negative endocrine ROSHypothyroidism   Renal/GU negative Renal ROS  negative genitourinary   Musculoskeletal negative musculoskeletal ROS (+)   Abdominal   Peds negative pediatric ROS (+)  Hematology negative hematology ROS (+) anemia ,   Anesthesia Other Findings   Reproductive/Obstetrics negative OB ROS                             Anesthesia Physical Anesthesia Plan  ASA: III  Anesthesia Plan: MAC   Post-op Pain Management:    Induction: Intravenous  PONV Risk Score and Plan:   Airway Management Planned: Simple Face Mask  Additional Equipment:   Intra-op Plan:   Post-operative Plan:   Informed Consent: I have reviewed the patients History and Physical, chart, labs and discussed the procedure including the risks, benefits and alternatives for the proposed anesthesia with the patient or authorized representative who has indicated his/her understanding and acceptance.     Plan Discussed with:   Anesthesia Plan Comments:         Anesthesia Quick Evaluation

## 2017-08-04 NOTE — Progress Notes (Signed)
Patient seen in short stay.  Hemoglobin 10.5 this morning. No further obvious bleeding. EGD today per plan.  The risks, benefits, limitations, alternatives and imponderables have been reviewed with the patient. Potential for esophageal dilation, biopsy, etc. have also been reviewed.  Questions have been answered. All parties agreeable.

## 2017-08-04 NOTE — Op Note (Signed)
[facility] Patient Name: Deanna Berry Procedure Date: 08/04/2017 12:25 PM MRN: [id number] Date of Birth: October 10, 1971 Attending MD: Norvel Richards , MD CSN: [id number] Age: 46 Admit Type: Inpatient Procedure:                Upper GI endoscopy Indications:              Melena Providers:                Norvel Richards, MD, Janeece Riggers, RN, Aram Candela Referring MD:              Medicines:                Propofol per Anesthesia Complications:            No immediate complications. Estimated Blood Loss:     Estimated blood loss was minimal. Procedure:                Pre-Anesthesia Assessment:                           - Prior to the procedure, a History and Physical                            was performed, and patient medications and                            allergies were reviewed. The patient's tolerance of                            previous anesthesia was also reviewed. The risks                            and benefits of the procedure and the sedation                            options and risks were discussed with the patient.                            All questions were answered, and informed consent                            was obtained. Prior Anticoagulants: The patient has                            taken no previous anticoagulant or antiplatelet                            agents. ASA Grade Assessment: III - A patient with                            severe systemic disease. After reviewing the risks  and benefits, the patient was deemed in                            satisfactory condition to undergo the procedure.                           After obtaining informed consent, the endoscope was                            passed under direct vision. Throughout the                            procedure, the patient's blood pressure, pulse, and                            oxygen saturations were monitored  continuously. The                            [id number] ([id number]) scope was introduced through the                            mouth, and advanced to the second part of duodenum.                            The upper GI endoscopy was accomplished without                            difficulty. The patient tolerated the procedure                            well. Scope In: 1:23:02 PM Scope Out: 1:31:03 PM Total Procedure Duration: 0 hours 8 minutes 1 second  Findings:      A small hiatal hernia was present.      One non-bleeding cratered gastric ulcer with no stigmata of bleeding was       found at the pylorus. The lesion was 12 mm in largest dimension. Lesion       appears larger than it did on endo photos from August 7th. Pylorus       patent (in fact, patulous) Channel easily traversed with scope.This was       biopsied with a cold forceps for histology.      The duodenal bulb and second portion of the duodenum were normal.      Plaques were found in the middle & distal third of the esophagus. see       photos. Impression:               - Small hiatal hernia. Esophageal plaques - R/O                            Candida                           - Non-bleeding gastric ulcer with no stigmata of  bleeding. Biopsied.                           - Normal duodenal bulb and second portion of the                            duodenum. Gastric ulcer nonhealing? goes back to at                            least May of this year. I suspect occult NSAID                            abuse in the setting of polysubstance abuse.                            Colonoscopy 2 years ago. Moderate Sedation:      Moderate (conscious) sedation was personally administered by an       anesthesia professional. The following parameters were monitored: oxygen       saturation, heart rate, blood pressure, respiratory rate, EKG, adequacy       of pulmonary ventilation, and response to care. Total  physician       intraservice time was 14 minutes. Recommendation:           - Return patient to hospital ward for ongoing care.                           - Low fat diet. Continue twice a day PPI therapy.                            Twice a day Carafate x 5 days. NSAID avoidance. FU                            pending studies                           - Continue present medications. Procedure Code(s):        --- Professional ---                           [phone], 47, Esophagogastroduodenoscopy, flexible,                            transoral; with biopsy, single or multiple Diagnosis Code(s):        --- Professional ---                           K44.9, Diaphragmatic hernia without obstruction or                            gangrene                           K25.9, Gastric ulcer, unspecified as acute or  chronic, without hemorrhage or perforation                           K92.1, Melena (includes Hematochezia) CPT copyright 2016 American Medical Association. All rights reserved. The codes documented in this report are preliminary and upon coder review may  be revised to meet current compliance requirements. Cristopher Estimable. Edmund Holcomb, MD Norvel Richards, MD 08/04/2017 2:16:07 PM This report has been signed electronically. Number of Addenda: 0

## 2017-08-04 NOTE — Progress Notes (Signed)
PROGRESS NOTE    Deanna Berry  FXT:[id number] DOB: 25-Oct-1971 DOA: 07/31/2017 PCP: Redmond School, MD     Brief Narrative:  46 year old woman admitted to the hospital from home on 10/1 due to being found down at home in the bathroom with blood on the toilet and on the floor. She has a history of a gastric ulcer. She was initially admitted to [facility] rocking him and was noticed to have a hemoglobin of 7 and was transfused 2 units of PRBCs. She had a CT scan of the abdomen that showed acute pancreatitis, UDS positive for methadone, found to have a UTI. She was transferred to [facility] for the purpose of GI consultation. On 10/4 has reported 3 bloody BMs and Hb has dropped to 6.8 and order has been given to transfuse. For EGD on 10/5.   Assessment & Plan:   Principal Problem:   Pancreatitis, acute Active Problems:   Bipolar 1 disorder (HCC)   Substance abuse ([place])   Anemia   Hypothyroidism   PUD (peptic ulcer disease)   Hematuria   Tobacco dependence   Vomiting   GI bleeding   Elevated liver enzymes   Acute blood loss anemia   Fall at home   Abdominal pain, epigastric   Acute pancreatitis   UTI (urinary tract infection)   Leukocytosis   Acute pancreatitis -Lipase trending down, clinically improved, tolerating diet without issues.  Hematemesis/Melena -Prior to arrival. -She reported 3-4 melanotic bowel movements on 10/4. -Seen by GI who is planning on performing an EGD today 10/5.  Acute blood loss anemia -Received 2 units of PRBCs at [facility] rocking him prior to arrival to this hospital system. -Hemoglobin on 10/4 was 6.8, will be transfused an additional 2 units to total 4 units of PRBCs this admission. -Hemoglobin today has responded appropriately to transfusion and is 10.5. Continue to follow.  Acute renal failure -Resolved with IV fluids, likely due to prerenal azotemia and acute tubular necrosis.  History of substance abuse -No narcotics will be prescribed on  discharge.   DVT prophylaxis: SCDs Code Status: Full code Family Communication: Patient only Disposition Plan: Likely home in 24-48 hours  Consultants:   GI  Procedures:   None  Antimicrobials:  Anti-infectives    Start     Dose/Rate Route Frequency Ordered Stop   07/31/17 1200  cefTRIAXone (ROCEPHIN) 1 g in dextrose 5 % 50 mL IVPB     1 g 100 mL/hr over 30 Minutes Intravenous Every 24 hours 07/31/17 1059         Subjective: Has no complaints.  Objective: Vitals:   08/04/17 1342 08/04/17 1345 08/04/17 1400 08/04/17 1413  BP: (!) 127/91 130/89 136/89 (!) 136/93  Pulse: 93 80 74 75  Resp: 18 19 15 17   Temp: 98 F (36.7 C)   98.6 F (37 C)  TempSrc:      SpO2: 95% 92% 96% 94%  Weight:      Height:        Intake/Output Summary (Last 24 hours) at 08/04/17 1719 Last data filed at 08/04/17 1400  Gross per 24 hour  Intake           4417.5 ml  Output             1200 ml  Net           3217.5 ml   Filed Weights   07/31/17 0840 07/31/17 1400  Weight: 44.5 kg (98 lb 1.7 oz) 44.5 kg (98 lb  1.7 oz)    Examination:  General exam: Alert, awake, oriented x 3 Respiratory system: Clear to auscultation. Respiratory effort normal. Cardiovascular system:RRR. No murmurs, rubs, gallops. Gastrointestinal system: Abdomen is nondistended, soft and nontender. No organomegaly or masses felt. Normal bowel sounds heard. Central nervous system: Alert and oriented. No focal neurological deficits. Extremities: No C/C/E, +pedal pulses Skin: No rashes, lesions or ulcers Psychiatry: Judgement and insight appear normal. Mood & affect appropriate.       Data Reviewed: I have personally reviewed following labs and imaging studies  CBC:  Recent Labs Lab 07/31/17 1140 08/01/17 0409 08/02/17 0403 08/02/17 1231 08/03/17 0459 08/03/17 2037 08/04/17 0454  WBC 21.5* 16.9* 13.7*  --  9.7  --  12.2*  NEUTROABS  --  12.0* 8.7*  --  5.9  --  8.5*  HGB 9.9* 9.6* 7.5* 8.6* 6.8* 11.6*  10.5*  HCT 29.3* 28.1* 22.3* 25.5* 20.6* 34.0* 30.7*  MCV 84.7 86.5 86.4  --  86.9  --  85.5  PLT 261 223 156  --  138*  --  675*   Basic Metabolic Panel:  Recent Labs Lab 07/31/17 1140 08/01/17 0409 08/02/17 0403 08/03/17 0459 08/04/17 0454  NA 145 144 141 141 140  K 3.2* 3.5 3.9 3.6 3.5  CL 114* 118* 118* 113* 110  CO2 18* 19* 20* 20* 19*  GLUCOSE 105* 78 74 83 79  BUN 85* 51* 20 11 7   CREATININE 1.72* 1.35* 0.84 0.86 0.75  CALCIUM 8.3* 7.9* 7.9* 7.8* 7.7*  MG  --  3.2* 2.1 1.6*  --    GFR: Estimated Creatinine Clearance: 61.7 mL/min (by C-G formula based on SCr of 0.75 mg/dL). Liver Function Tests:  Recent Labs Lab 07/31/17 1140 08/01/17 0409 08/02/17 0403 08/03/17 0459 08/04/17 0454  AST 128* 48* 32 34 21  ALT 664* 392* 204* 149* 123*  ALKPHOS 163* 127* 120 120 110  BILITOT 2.6* 1.7* 1.3* 0.9 1.0  PROT 6.1* 5.3* 4.2* 4.1* 4.4*  ALBUMIN 2.7* 2.3* 1.8* 1.7* 1.8*    Recent Labs Lab 07/31/17 1140 08/01/17 0409 08/02/17 0403 08/03/17 0459 08/04/17 0454  LIPASE 165* 95* 67* 72* 68*    Recent Labs Lab 07/31/17 1202 08/01/17 0409  AMMONIA 18 38*   Coagulation Profile:  Recent Labs Lab 07/31/17 1140 08/01/17 0409 08/02/17 0403 08/03/17 0459 08/04/17 0454  INR 2.01 1.41 1.30 1.22 1.14   Cardiac Enzymes:  Recent Labs Lab 07/31/17 1140 08/01/17 0409 08/02/17 0403 08/03/17 0459 08/04/17 0454  CKTOTAL 425* 198 83 71 74   BNP (last 3 results) No results for input(s): PROBNP in the last 8760 hours. HbA1C: No results for input(s): HGBA1C in the last 72 hours. CBG: No results for input(s): GLUCAP in the last 168 hours. Lipid Profile: No results for input(s): CHOL, HDL, LDLCALC, TRIG, CHOLHDL, LDLDIRECT in the last 72 hours. Thyroid Function Tests: No results for input(s): TSH, T4TOTAL, FREET4, T3FREE, THYROIDAB in the last 72 hours. Anemia Panel: No results for input(s): VITAMINB12, FOLATE, FERRITIN, TIBC, IRON, RETICCTPCT in the last 72  hours. Urine analysis:    Component Value Date/Time   COLORURINE YELLOW 07/31/2017 1040   APPEARANCEUR HAZY (A) 07/31/2017 1040   LABSPEC 1.015 07/31/2017 1040   PHURINE 5.0 07/31/2017 1040   GLUCOSEU NEGATIVE 07/31/2017 1040   HGBUR NEGATIVE 07/31/2017 1040   BILIRUBINUR NEGATIVE 07/31/2017 1040   KETONESUR NEGATIVE 07/31/2017 1040   PROTEINUR NEGATIVE 07/31/2017 1040   UROBILINOGEN 1.0 12/11/2012 1744   NITRITE NEGATIVE 07/31/2017 1040  LEUKOCYTESUR LARGE (A) 07/31/2017 1040   Sepsis Labs: @LABRCNTIP (procalcitonin:4,lacticidven:4)  ) Recent Results (from the past 240 hour(s))  MRSA PCR Screening     Status: None   Collection Time: 07/31/17 11:43 AM  Result Value Ref Range Status   MRSA by PCR NEGATIVE NEGATIVE Final    Comment:        The GeneXpert MRSA Assay (FDA approved for NASAL specimens only), is one component of a comprehensive MRSA colonization surveillance program. It is not intended to diagnose MRSA infection nor to guide or monitor treatment for MRSA infections.   Culture, blood (Routine X 2) w Reflex to ID Panel     Status: None (Preliminary result)   Collection Time: 07/31/17  2:52 PM  Result Value Ref Range Status   Specimen Description RIGHT ANTECUBITAL  Final   Special Requests   Final    BOTTLES DRAWN AEROBIC ONLY Blood Culture results may not be optimal due to an inadequate volume of blood received in culture bottles   Culture NO GROWTH 4 DAYS  Final   Report Status PENDING  Incomplete  Culture, blood (Routine X 2) w Reflex to ID Panel     Status: None (Preliminary result)   Collection Time: 07/31/17  2:52 PM  Result Value Ref Range Status   Specimen Description BLOOD LEFT HAND  Final   Special Requests   Final    BOTTLES DRAWN AEROBIC ONLY Blood Culture results may not be optimal due to an inadequate volume of blood received in culture bottles   Culture NO GROWTH 4 DAYS  Final   Report Status PENDING  Incomplete  KOH prep     Status: None    Collection Time: 08/04/17  1:35 PM  Result Value Ref Range Status   Specimen Description GASTRIC  Final   Special Requests NONE  Final   KOH Prep YEAST  Final   Report Status 08/04/2017 FINAL  Final         Radiology Studies: No results found.      Scheduled Meds: . acetylcysteine  140 mg/kg (Order-Specific) Oral Once  . carbamazepine  200 mg Oral BID  . carbamazepine  400 mg Oral BID  . levothyroxine  50 mcg Oral QAC breakfast  . linaclotide  290 mcg Oral QAC breakfast  . pantoprazole  40 mg Oral BID AC  . QUEtiapine  300 mg Oral QHS  . sucralfate  1 g Oral BID  . vortioxetine HBr  20 mg Oral Daily   Continuous Infusions: . cefTRIAXone (ROCEPHIN)  IV Stopped (08/04/17 1507)  . lactated ringers with kcl 150 mL/hr at 08/04/17 1624     LOS: 4 days    Time spent: 35 minutes. Greater than 50% of this time was spent in direct contact with the patient coordinating care.     Lelon Frohlich, MD [facility] Pager [phone]  If 7PM-7AM, please contact night-coverage [email or web address] Password [email or web address] 08/04/2017, 5:19 PM

## 2017-08-04 NOTE — Anesthesia Procedure Notes (Signed)
Procedure Name: MAC Date/Time: 08/04/2017 1:15 PM Performed by: Andree Elk, AMY A Pre-anesthesia Checklist: Patient identified, Emergency Drugs available, Suction available, Patient being monitored and Timeout performed Oxygen Delivery Method: Simple face mask

## 2017-08-04 NOTE — Anesthesia Postprocedure Evaluation (Signed)
Anesthesia Post Note  Patient: Deanna Berry  Procedure(s) Performed: ESOPHAGOGASTRODUODENOSCOPY (EGD) WITH PROPOFOL (N/A ) BIOPSY  Patient location during evaluation: PACU Anesthesia Type: MAC Level of consciousness: awake and alert, oriented and patient cooperative Pain management: pain level controlled Vital Signs Assessment: post-procedure vital signs reviewed and stable Respiratory status: spontaneous breathing, respiratory function stable and patient connected to nasal cannula oxygen Cardiovascular status: stable Postop Assessment: no apparent nausea or vomiting Anesthetic complications: no     Last Vitals:  Vitals:   08/04/17 1342 08/04/17 1345  BP: (!) 127/91 130/89  Pulse: 93 80  Resp: 18 19  Temp: 36.7 C   SpO2: 95% 92%    Last Pain:  Vitals:   08/04/17 1342  TempSrc:   PainSc: 0-No pain                 Riggins Cisek A

## 2017-08-05 LAB — CBC WITH DIFFERENTIAL/PLATELET
BASOS PCT: 0 %
Basophils Absolute: 0 10*3/uL (ref 0.0–0.1)
Eosinophils Absolute: 0.3 10*3/uL (ref 0.0–0.7)
Eosinophils Relative: 2 %
HEMATOCRIT: 31.7 % — AB (ref 36.0–46.0)
HEMOGLOBIN: 10.8 g/dL — AB (ref 12.0–15.0)
LYMPHS ABS: 1.8 10*3/uL (ref 0.7–4.0)
Lymphocytes Relative: 15 %
MCH: 29.3 pg (ref 26.0–34.0)
MCHC: 34.1 g/dL (ref 30.0–36.0)
MCV: 85.9 fL (ref 78.0–100.0)
MONOS PCT: 11 %
Monocytes Absolute: 1.3 10*3/uL — ABNORMAL HIGH (ref 0.1–1.0)
NEUTROS ABS: 8.8 10*3/uL — AB (ref 1.7–7.7)
NEUTROS PCT: 72 %
Platelets: 147 10*3/uL — ABNORMAL LOW (ref 150–400)
RBC: 3.69 MIL/uL — ABNORMAL LOW (ref 3.87–5.11)
RDW: 16.3 % — ABNORMAL HIGH (ref 11.5–15.5)
WBC: 12.2 10*3/uL — ABNORMAL HIGH (ref 4.0–10.5)

## 2017-08-05 LAB — COMPREHENSIVE METABOLIC PANEL
ALT: 98 U/L — ABNORMAL HIGH (ref 14–54)
ANION GAP: 8 (ref 5–15)
AST: 19 U/L (ref 15–41)
Albumin: 2 g/dL — ABNORMAL LOW (ref 3.5–5.0)
Alkaline Phosphatase: 120 U/L (ref 38–126)
BUN: 5 mg/dL — ABNORMAL LOW (ref 6–20)
CALCIUM: 8 mg/dL — AB (ref 8.9–10.3)
CHLORIDE: 111 mmol/L (ref 101–111)
CO2: 23 mmol/L (ref 22–32)
Creatinine, Ser: 0.68 mg/dL (ref 0.44–1.00)
GFR calc non Af Amer: >60 mL/min (ref >60)
Glucose, Bld: 91 mg/dL (ref 65–99)
Potassium: 3.6 mmol/L (ref 3.5–5.1)
SODIUM: 142 mmol/L (ref 135–145)
Total Bilirubin: 0.9 mg/dL (ref 0.3–1.2)
Total Protein: 4.7 g/dL — ABNORMAL LOW (ref 6.5–8.1)

## 2017-08-05 LAB — CULTURE, BLOOD (ROUTINE X 2)
CULTURE: NO GROWTH
Culture: NO GROWTH

## 2017-08-05 LAB — PROTIME-INR
INR: 1.11
Prothrombin Time: 14.2 seconds (ref 11.4–15.2)

## 2017-08-05 LAB — LIPASE, BLOOD: Lipase: 75 U/L — ABNORMAL HIGH (ref 11–51)

## 2017-08-05 MED ORDER — CHLORHEXIDINE GLUCONATE CLOTH 2 % EX PADS: 6.0000 | MEDICATED_PAD | Freq: Once | CUTANEOUS | Status: DC

## 2017-08-05 NOTE — Progress Notes (Signed)
done

## 2017-08-05 NOTE — Progress Notes (Signed)
I woke patient from a sound sleep. She denies abdominal pain. Doesn't remember eating breakfast. Discussed with nursing staff.  Clinically, no further bleeding.  KOH prep negative for yeast.   Vital signs in last 24 hours: Temp:  [98 F (36.7 C)-98.6 F (37 C)] 98.4 F (36.9 C) (10/06 0631) Pulse Rate:  [74-94] 94 (10/06 0631) Resp:  [12-19] 18 (10/06 0631) BP: (127-140)/(84-93) 140/85 (10/06 0631) SpO2:  [92 %-100 %] 98 % (10/06 0631)   General:   Alert, pleasant and cooperative in NAD Abdomen:  nondistended.  Normal bowel sounds, without guarding, and without rebound.  Abdomen is nontender to palpation. No mass or organomegaly. Extremities:  Without clubbing or edema.    Intake/Output from previous day: 10/05 0701 - 10/06 0700 In: 2912.5 [I.V.:2912.5] Out: 600 [Urine:600] Intake/Output this shift: No intake/output data recorded.  Lab Results:  Recent Labs  08/03/17 0459 08/03/17 2037 08/04/17 0454 08/05/17 0635  WBC 9.7  --  12.2* 12.2*  HGB 6.8* 11.6* 10.5* 10.8*  HCT 20.6* 34.0* 30.7* 31.7*  PLT 138*  --  128* 147*   BMET  Recent Labs  08/03/17 0459 08/04/17 0454 08/05/17 0635  NA 141 140 142  K 3.6 3.5 3.6  CL 113* 110 111  CO2 20* 19* 23  GLUCOSE 83 79 91  BUN 11 7 5*  CREATININE 0.86 0.75 0.68  CALCIUM 7.8* 7.7* 8.0*   LFT  Recent Labs  08/05/17 0635  PROT 4.7*  ALBUMIN 2.0*  AST 19  ALT 98*  ALKPHOS 120  BILITOT 0.9   PT/INR  Recent Labs  08/04/17 0454 08/05/17 0635  LABPROT 14.5 14.2  INR 1.14 1.11     Impression:   46 year old lady with upper GI bleed secondary to peptic ulcer disease-likely NSAID related. Doing well today without further evidence of bleeding. History of mildly elevated LFTs and lipase-improved.   Recommendations:  Home on twice a day PPI therapy. Carafate twice a day for 4 more days. Follow up on pathology. Avoidance of NSAID agents emphasized. Discussed with the mother at length yesterday.  From a GI  standpoint, can be discharged any time. I'll arrange follow-up with Dr. Oneida Alar in 3-4 weeks.  I'll follow-up on ulcer biopsy.

## 2017-08-05 NOTE — Discharge Summary (Signed)
Physician Discharge Summary  Dovie Kapusta EYC:[id number] DOB: September 14, 1971 DOA: 07/31/2017  PCP: Redmond School, MD  Admit date: 07/31/2017 Discharge date: 08/05/2017  Time spent: 45 minutes  Recommendations for Outpatient Follow-up:  -To be discharged home today. -Advised to follow-up with primary care provider in 2 weeks.   Discharge Diagnoses:  Principal Problem:   Pancreatitis, acute Active Problems:   Bipolar 1 disorder ([place])   Substance abuse ([place])   Anemia   Hypothyroidism   PUD (peptic ulcer disease)   Hematuria   Tobacco dependence   Vomiting   GI bleeding   Elevated liver enzymes   Acute blood loss anemia   Fall at home   Abdominal pain, epigastric   Acute pancreatitis   UTI (urinary tract infection)   Leukocytosis   Discharge Condition: Stable and improved  Filed Weights   07/31/17 0840 07/31/17 1400  Weight: 44.5 kg (98 lb 1.7 oz) 44.5 kg (98 lb 1.7 oz)    History of present illness:  As per Dr. Wynetta Emery on 10/1: Miquel Lamson is a 46 y.o. female with a complex PMH including bipolar disorder, substance abuse, PUD, apparently has been on methadone and reportedly has been clean for a couple of years was found down at home in the bathroom with blood in toilet and on the floor.  The patient does have a history of a bleeding ulcer in the stomach and has been followed by [facility]. The patient has had a history of blood transfusions in the past secondary to hematemesis. The patient has a history of chronic abdominal pain but family reports that she has been complaining of worsening abdominal pain for the past 4-5 days. She is also been vomiting more frequently in the past several days. She was initially taken to [facility] noted to be anemic with a hemoglobin of 7. She received 2 units of red blood cells. She had a CT of the abdomen that was significant for acute pancreatitis. She was found to have a UTI. Her drug screen was positive for methadone only. She was  treated with IV fluids and IV ciprofloxacin and Flagyl at [facility]. She was transferred to this hospital for GI consultation. Her lipase was noted to be greater than 4:30. She also had an elevated liver enzyme. Her ALT was 930 and the AST was 305.  Hospital Course:   Acute pancreatitis -Lipase trending down, clinically improved, tolerating diet without issues.  Hematemesis/Melena -Prior to arrival. -She reported 3-4 melanotic bowel movements on 10/4. -Seen by GI who performed an EGD on 10/5: Small had all hernia, esophageal plaques to rule out Candida, nonbleeding gastric ulcer. -They suspect occult NSAID abuse. Patient advised to avoid NSAID use. -Has had no further signs of bleeding, hemoglobin stable after transfusion.  Acute blood loss anemia -Received 2 units of PRBCs at [facility] prior to arrival to this hospital system. -Hemoglobin on 10/4 was 6.8, was transfused an additional 2 units to total 4 units of PRBCs this admission. -Hemoglobin today has responded appropriately to transfusion and is 10.8.   Acute renal failure -Resolved with IV fluids, likely due to prerenal azotemia and acute tubular necrosis.  History of substance abuse -No narcotics will be prescribed on discharge.  Procedures:  EGD on 10/5 with results as above   Consultations:  GI  Discharge Instructions  Discharge Instructions    Diet - low sodium heart healthy    Complete by:  As directed    Increase activity slowly  Complete by:  As directed      Allergies as of 08/05/2017      Reactions   Amoxil [amoxicillin] Hives   Has patient had a PCN reaction causing immediate rash, facial/tongue/throat swelling, SOB or lightheadedness with hypotension: No Has patient had a PCN reaction causing severe rash involving mucus membranes or skin necrosis: No Has patient had a PCN reaction that required hospitalization: No Has patient had a PCN reaction occurring within the last 10 years: No If all of the  above answers are "NO", then may proceed with Cephalosporin use.   Adhesive [tape] Rash   Neosporin Original [bacitracin-neomycin-polymyxin] Rash      Medication List    TAKE these medications   carbamazepine 200 MG 12 hr tablet Commonly known as:  TEGRETOL XR Take 200 mg by mouth 2 (two) times daily.   carbamazepine 400 MG 12 hr tablet Commonly known as:  TEGRETOL XR Take 400 mg by mouth 2 (two) times daily.   docusate sodium 100 MG capsule Commonly known as:  COLACE Take 1 capsule (100 mg total) by mouth 2 (two) times daily.   gabapentin 800 MG tablet Commonly known as:  NEURONTIN Take 800 mg by mouth 3 (three) times daily.   levothyroxine 50 MCG tablet Commonly known as:  SYNTHROID, LEVOTHROID Take 1 tablet by mouth daily before breakfast.   linaclotide 290 MCG Caps capsule Commonly known as:  LINZESS Take 1 capsule (290 mcg total) by mouth daily before breakfast.   Melatonin 10 MG Tabs Take 1 tablet by mouth at bedtime.   pantoprazole 40 MG tablet Commonly known as:  PROTONIX Take 1 tablet (40 mg total) by mouth 2 (two) times daily before a meal.   promethazine 12.5 MG tablet Commonly known as:  PHENERGAN Take 1 tablet (12.5 mg total) by mouth every 6 (six) hours as needed for nausea.   QUEtiapine 300 MG 24 hr tablet Commonly known as:  SEROQUEL XR Take 300 mg by mouth at bedtime.   sucralfate 1 g tablet Commonly known as:  CARAFATE Take 1 tablet (1 g total) by mouth 4 (four) times daily -  with meals and at bedtime.   TRINTELLIX 20 MG Tabs Generic drug:  vortioxetine HBr Take 20 mg by mouth daily.      Allergies  Allergen Reactions  . Amoxil [Amoxicillin] Hives    Has patient had a PCN reaction causing immediate rash, facial/tongue/throat swelling, SOB or lightheadedness with hypotension: No Has patient had a PCN reaction causing severe rash involving mucus membranes or skin necrosis: No Has patient had a PCN reaction that required hospitalization:  No Has patient had a PCN reaction occurring within the last 10 years: No If all of the above answers are "NO", then may proceed with Cephalosporin use.   . Adhesive [Tape] Rash  . Neosporin Original [Bacitracin-Neomycin-Polymyxin] Rash   Follow-up Information    Redmond School, MD. Schedule an appointment as soon as possible for a visit in 2 week(s).   Specialty:  Internal Medicine Contact information: [address] [place] [address] [phone]            The results of significant diagnostics from this hospitalization (including imaging, microbiology, ancillary and laboratory) are listed below for reference.    Significant Diagnostic Studies: US Abdomen Complete  Result Date: 07/31/2017 CLINICAL DATA:  Elevated liver enzymes and pancreatitis EXAM: ABDOMEN ULTRASOUND COMPLETE COMPARISON:  CT abdomen and pelvis July 31, 2017 FINDINGS: Gallbladder: Within the gallbladder, there are multiple tiny  echogenic foci which may represent cholesterol crystals. There is moderate sludge in the gallbladder. No well-defined gallstones are seen. There is no appreciable gallbladder wall thickening or pericholecystic fluid. No sonographic Murphy sign noted by sonographer. Common bile duct: Diameter: 2 mm. There is no intrahepatic, common hepatic, or common bile duct dilatation. Liver: No focal lesion identified. Within normal limits in parenchymal echogenicity. Portal vein is patent on color Doppler imaging with normal direction of blood flow towards the liver. IVC: No abnormality visualized. Pancreas: Pancreas overall appears prominent and edematous without well-defined mass or fluid collection. There is no pancreatic duct dilatation. Spleen: Size and appearance within normal limits. Right Kidney: Length: 9.3 cm. Echogenicity within normal limits. No mass visualized. There is mild pelvicaliectasis. Left Kidney: Length: 9.3 cm. Echogenicity within normal limits. There is a cyst in the lower  pole left kidney medially measuring 1.0 x 0.8 x 0.9 cm. There is mild pelvicaliectasis. Abdominal aorta: No aneurysm visualized. Other findings: No demonstrable ascites. IMPRESSION: 1. Pancreas appears prominent and edematous without mass or inflammatory focus. The appearance of the pancreas does indicate a degree of acute pancreatitis. 2. Sludge and cholesterol crystals within the gallbladder. No gallstones evident. No gallbladder wall thickening or pericholecystic fluid. 3. Slight fullness of each renal collecting system without focal area of obstruction identified on either side. 4.  Small left renal cyst. Electronically Signed   By: Lowella Grip III M.D.   On: 07/31/2017 12:03   US Ob Comp Less 14 Wks  Result Date: 07/06/2017 CLINICAL DATA:  Pregnant patient with 2 days of abdominal pain. EXAM: OBSTETRIC <14 WK [place] AND TRANSVAGINAL OB US DOPPLER ULTRASOUND OF OVARIES TECHNIQUE: Both transabdominal and transvaginal ultrasound examinations were performed for complete evaluation of the gestation as well as the maternal uterus, adnexal regions, and pelvic cul-de-sac. Transvaginal technique was performed to assess early pregnancy. Color and duplex Doppler ultrasound was utilized to evaluate blood flow to the ovaries. COMPARISON:  None. FINDINGS: Intrauterine gestational sac: None Maternal uterus/adnexae: The right ovary was not visualized. The left ovary is normal in appearance. Arterial and venous blood flow was seen in the left ovary. No free fluid in the pelvis. Pulsed Doppler evaluation of the left ovary demonstrates normal appearing low-resistance arterial and venous waveforms. IMPRESSION: 1. No IUP is identified. In the setting of pregnancy, this can represent early pregnancy, miscarriage, or ectopic pregnancy. Recommend clinical correlation and follow-up as clinically warranted. 2. The right ovary was not visualized. The left ovary is normal in appearance with arterial and venous blood flow seen in the  left ovary. Electronically Signed   By: Dorise Bullion III M.D   On: 07/06/2017 19:47   US Ob Transvaginal  Result Date: 07/06/2017 CLINICAL DATA:  Pregnant patient with 2 days of abdominal pain. EXAM: OBSTETRIC <14 WK [place] AND TRANSVAGINAL OB US DOPPLER ULTRASOUND OF OVARIES TECHNIQUE: Both transabdominal and transvaginal ultrasound examinations were performed for complete evaluation of the gestation as well as the maternal uterus, adnexal regions, and pelvic cul-de-sac. Transvaginal technique was performed to assess early pregnancy. Color and duplex Doppler ultrasound was utilized to evaluate blood flow to the ovaries. COMPARISON:  None. FINDINGS: Intrauterine gestational sac: None Maternal uterus/adnexae: The right ovary was not visualized. The left ovary is normal in appearance. Arterial and venous blood flow was seen in the left ovary. No free fluid in the pelvis. Pulsed Doppler evaluation of the left ovary demonstrates normal appearing low-resistance arterial and venous waveforms. IMPRESSION: 1. No IUP is identified. In  the setting of pregnancy, this can represent early pregnancy, miscarriage, or ectopic pregnancy. Recommend clinical correlation and follow-up as clinically warranted. 2. The right ovary was not visualized. The left ovary is normal in appearance with arterial and venous blood flow seen in the left ovary. Electronically Signed   By: Dorise Bullion III M.D   On: 07/06/2017 19:47   US Pelvic Doppler (torsion R/o Or Mass Arterial Flow)  Result Date: 07/06/2017 CLINICAL DATA:  Pregnant patient with 2 days of abdominal pain. EXAM: OBSTETRIC <14 WK [place] AND TRANSVAGINAL OB US DOPPLER ULTRASOUND OF OVARIES TECHNIQUE: Both transabdominal and transvaginal ultrasound examinations were performed for complete evaluation of the gestation as well as the maternal uterus, adnexal regions, and pelvic cul-de-sac. Transvaginal technique was performed to assess early pregnancy. Color and duplex Doppler  ultrasound was utilized to evaluate blood flow to the ovaries. COMPARISON:  None. FINDINGS: Intrauterine gestational sac: None Maternal uterus/adnexae: The right ovary was not visualized. The left ovary is normal in appearance. Arterial and venous blood flow was seen in the left ovary. No free fluid in the pelvis. Pulsed Doppler evaluation of the left ovary demonstrates normal appearing low-resistance arterial and venous waveforms. IMPRESSION: 1. No IUP is identified. In the setting of pregnancy, this can represent early pregnancy, miscarriage, or ectopic pregnancy. Recommend clinical correlation and follow-up as clinically warranted. 2. The right ovary was not visualized. The left ovary is normal in appearance with arterial and venous blood flow seen in the left ovary. Electronically Signed   By: Dorise Bullion III M.D   On: 07/06/2017 19:47    Microbiology: Recent Results (from the past 240 hour(s))  MRSA PCR Screening     Status: None   Collection Time: 07/31/17 11:43 AM  Result Value Ref Range Status   MRSA by PCR NEGATIVE NEGATIVE Final    Comment:        The GeneXpert MRSA Assay (FDA approved for NASAL specimens only), is one component of a comprehensive MRSA colonization surveillance program. It is not intended to diagnose MRSA infection nor to guide or monitor treatment for MRSA infections.   Culture, blood (Routine X 2) w Reflex to ID Panel     Status: None   Collection Time: 07/31/17  2:52 PM  Result Value Ref Range Status   Specimen Description RIGHT ANTECUBITAL  Final   Special Requests   Final    BOTTLES DRAWN AEROBIC ONLY Blood Culture results may not be optimal due to an inadequate volume of blood received in culture bottles   Culture NO GROWTH 5 DAYS  Final   Report Status 08/05/2017 FINAL  Final  Culture, blood (Routine X 2) w Reflex to ID Panel     Status: None   Collection Time: 07/31/17  2:52 PM  Result Value Ref Range Status   Specimen Description BLOOD LEFT HAND   Final   Special Requests   Final    BOTTLES DRAWN AEROBIC ONLY Blood Culture results may not be optimal due to an inadequate volume of blood received in culture bottles   Culture NO GROWTH 5 DAYS  Final   Report Status 08/05/2017 FINAL  Final  KOH prep     Status: None   Collection Time: 08/04/17  1:35 PM  Result Value Ref Range Status   Specimen Description GASTRIC  Final   Special Requests NONE  Final   KOH Prep YEAST  Final   Report Status 08/04/2017 FINAL  Final     Labs: Basic Metabolic  Panel:  Recent Labs Lab 08/01/17 0409 08/02/17 0403 08/03/17 0459 08/04/17 0454 08/05/17 0635  NA 144 141 141 140 142  K 3.5 3.9 3.6 3.5 3.6  CL 118* 118* 113* 110 111  CO2 19* 20* 20* 19* 23  GLUCOSE 78 74 83 79 91  BUN 51* 20 11 7  5*  CREATININE 1.35* 0.84 0.86 0.75 0.68  CALCIUM 7.9* 7.9* 7.8* 7.7* 8.0*  MG 3.2* 2.1 1.6*  --   --    Liver Function Tests:  Recent Labs Lab 08/01/17 0409 08/02/17 0403 08/03/17 0459 08/04/17 0454 08/05/17 0635  AST 48* 32 34 21 19  ALT 392* 204* 149* 123* 98*  ALKPHOS 127* 120 120 110 120  BILITOT 1.7* 1.3* 0.9 1.0 0.9  PROT 5.3* 4.2* 4.1* 4.4* 4.7*  ALBUMIN 2.3* 1.8* 1.7* 1.8* 2.0*    Recent Labs Lab 08/01/17 0409 08/02/17 0403 08/03/17 0459 08/04/17 0454 08/05/17 0635  LIPASE 95* 67* 72* 68* 75*    Recent Labs Lab 07/31/17 1202 08/01/17 0409  AMMONIA 18 38*   CBC:  Recent Labs Lab 08/01/17 0409 08/02/17 0403 08/02/17 1231 08/03/17 0459 08/03/17 2037 08/04/17 0454 08/05/17 0635  WBC 16.9* 13.7*  --  9.7  --  12.2* 12.2*  NEUTROABS 12.0* 8.7*  --  5.9  --  8.5* 8.8*  HGB 9.6* 7.5* 8.6* 6.8* 11.6* 10.5* 10.8*  HCT 28.1* 22.3* 25.5* 20.6* 34.0* 30.7* 31.7*  MCV 86.5 86.4  --  86.9  --  85.5 85.9  PLT 223 156  --  138*  --  128* 147*   Cardiac Enzymes:  Recent Labs Lab 07/31/17 1140 08/01/17 0409 08/02/17 0403 08/03/17 0459 08/04/17 0454  CKTOTAL 425* 198 83 71 74   BNP: BNP (last 3 results) No results  for input(s): BNP in the last 8760 hours.  ProBNP (last 3 results) No results for input(s): PROBNP in the last 8760 hours.  CBG: No results for input(s): GLUCAP in the last 168 hours.     SignedLelon Frohlich  [facility] Pager: [phone] 08/05/2017, 5:35 PM

## 2017-08-07 ENCOUNTER — Encounter (HOSPITAL_COMMUNITY): Payer: Self-pay | Admitting: Internal Medicine

## 2017-08-07 NOTE — Patient Instructions (Signed)
Deanna Berry  08/07/2017     @PREFPERIOPPHARMACY @   Your procedure is scheduled on 08/15/2017.  Report to [facility] at 6:15 A.M.  Call this number if you have problems the morning of surgery:  [phone]   Remember:  Do not eat food or drink liquids after midnight.  Take these medicines the morning of surgery with A SIP OF WATER Tegretal, Gabapentin, Synthroid, Linzess, Protonix, Phenergan, Seroquel,   Carafate, Trintellix   Do not wear jewelry, make-up or nail polish.  Do not wear lotions, powders, or perfumes, or deoderant.  Do not shave 48 hours prior to surgery.  Men may shave face and neck.  Do not bring valuables to the hospital.  [facility] is not responsible for any belongings or valuables.  Contacts, dentures or bridgework may not be worn into surgery.  Leave your suitcase in the car.  After surgery it may be brought to your room.  For patients admitted to the hospital, discharge time will be determined by your treatment team.  Patients discharged the day of surgery will not be allowed to drive home.    Please read over the following fact sheets that you were given. Anesthesia Post-op Instructions     PATIENT INSTRUCTIONS POST-ANESTHESIA  IMMEDIATELY FOLLOWING SURGERY:  Do not drive or operate machinery for the first twenty four hours after surgery.  Do not make any important decisions for twenty four hours after surgery or while taking narcotic pain medications or sedatives.  If you develop intractable nausea and vomiting or a severe headache please notify your doctor immediately.  FOLLOW-UP:  Please make an appointment with your surgeon as instructed. You do not need to follow up with anesthesia unless specifically instructed to do so.  WOUND CARE INSTRUCTIONS (if applicable):  Keep a dry clean dressing on the anesthesia/puncture wound site if there is drainage.  Once the wound has quit draining you may leave it open to air.  Generally you should leave the  bandage intact for twenty four hours unless there is drainage.  If the epidural site drains for more than 36-48 hours please call the anesthesia department.  QUESTIONS?:  Please feel free to call your physician or the hospital operator if you have any questions, and they will be happy to assist you.      Esophagogastroduodenoscopy Esophagogastroduodenoscopy (EGD) is a procedure to examine the lining of the esophagus, stomach, and first part of the small intestine (duodenum). This procedure is done to check for problems such as inflammation, bleeding, ulcers, or growths. During this procedure, a long, flexible, lighted tube with a camera attached (endoscope) is inserted down the throat. Tell a health care provider about:  Any allergies you have.  All medicines you are taking, including vitamins, herbs, eye drops, creams, and over-the-counter medicines.  Any problems you or family members have had with anesthetic medicines.  Any blood disorders you have.  Any surgeries you have had.  Any medical conditions you have.  Whether you are pregnant or may be pregnant. What are the risks? Generally, this is a safe procedure. However, problems may occur, including:  Infection.  Bleeding.  A tear (perforation) in the esophagus, stomach, or duodenum.  Trouble breathing.  Excessive sweating.  Spasms of the larynx.  A slowed heartbeat.  Low blood pressure.  What happens before the procedure?  Follow instructions from your health care provider about eating or drinking restrictions.  Ask your health care provider about: ? Changing or stopping your regular medicines.  This is especially important if you are taking diabetes medicines or blood thinners. ? Taking medicines such as aspirin and ibuprofen. These medicines can thin your blood. Do not take these medicines before your procedure if your health care provider instructs you not to.  Plan to have someone take you home after the  procedure.  If you wear dentures, be ready to remove them before the procedure. What happens during the procedure?  To reduce your risk of infection, your health care team will wash or sanitize their hands.  An IV tube will be put in a vein in your hand or arm. You will get medicines and fluids through this tube.  You will be given one or more of the following: ? A medicine to help you relax (sedative). ? A medicine to numb the area (local anesthetic). This medicine may be sprayed into your throat. It will make you feel more comfortable and keep you from gagging or coughing during the procedure. ? A medicine for pain.  A mouth guard may be placed in your mouth to protect your teeth and to keep you from biting on the endoscope.  You will be asked to lie on your left side.  The endoscope will be lowered down your throat into your esophagus, stomach, and duodenum.  Air will be put into the endoscope. This will help your health care provider see better.  The lining of your esophagus, stomach, and duodenum will be examined.  Your health care provider may: ? Take a tissue sample so it can be looked at in a lab (biopsy). ? Remove growths. ? Remove objects (foreign bodies) that are stuck. ? Treat any bleeding with medicines or other devices that stop tissue from bleeding. ? Widen (dilate) or stretch narrowed areas of your esophagus and stomach.  The endoscope will be taken out. The procedure may vary among health care providers and hospitals. What happens after the procedure?  Your blood pressure, heart rate, breathing rate, and blood oxygen level will be monitored often until the medicines you were given have worn off.  Do not eat or drink anything until the numbing medicine has worn off and your gag reflex has returned. This information is not intended to replace advice given to you by your health care provider. Make sure you discuss any questions you have with your health care  provider. Document Released: 02/17/2005 Document Revised: 03/24/2016 Document Reviewed: 09/10/2015 Elsevier Interactive Patient Education  2018 [employer]. Esophageal Dilatation Esophageal dilatation is a procedure to open a blocked or narrowed part of the esophagus. The esophagus is the long tube in your throat that carries food and liquid from your mouth to your stomach. The procedure is also called esophageal dilation. You may need this procedure if you have a buildup of scar tissue in your esophagus that makes it difficult, painful, or even impossible to swallow. This can be caused by gastroesophageal reflux disease (GERD). In rare cases, people need this procedure because they have cancer of the esophagus or a problem with the way food moves through the esophagus. Sometimes you may need to have another dilatation to enlarge the opening of the esophagus gradually. Tell a health care provider about:  Any allergies you have.  All medicines you are taking, including vitamins, herbs, eye drops, creams, and over-the-counter medicines.  Any problems you or family members have had with anesthetic medicines.  Any blood disorders you have.  Any surgeries you have had.  Any medical conditions you have.  Any antibiotic medicines you are required to take before dental procedures. What are the risks? Generally, this is a safe procedure. However, problems can occur and include:  Bleeding from a tear in the lining of the esophagus.  A hole (perforation) in the esophagus.  What happens before the procedure?  Do not eat or drink anything after midnight on the night before the procedure or as directed by your health care provider.  Ask your health care provider about changing or stopping your regular medicines. This is especially important if you are taking diabetes medicines or blood thinners.  Plan to have someone take you home after the procedure. What happens during the procedure?  You  will be given a medicine that makes you relaxed and sleepy (sedative).  A medicine may be sprayed or gargled to numb the back of the throat.  Your health care provider can use various instruments to do an esophageal dilatation. During the procedure, the instrument used will be placed in your mouth and passed down into your esophagus. Options include: ? Simple dilators. This instrument is carefully placed in the esophagus to stretch it. ? Guided wire bougies. In this method, a flexible tube (endoscope) is used to insert a wire into the esophagus. The dilator is passed over this wire to enlarge the esophagus. Then the wire is removed. ? Balloon dilators. An endoscope with a small balloon at the end is passed down into the esophagus. Inflating the balloon gently stretches the esophagus and opens it up. What happens after the procedure?  Your blood pressure, heart rate, breathing rate, and blood oxygen level will be monitored often until the medicines you were given have worn off.  Your throat may feel slightly sore and will probably still feel numb. This will improve slowly over time.  You will not be allowed to eat or drink until the throat numbness has resolved.  If this is a same-day procedure, you may be allowed to go home once you have been able to drink, urinate, and sit on the edge of the bed without nausea or dizziness.  If this is a same-day procedure, you should have a friend or family member with you for the next 24 hours after the procedure. This information is not intended to replace advice given to you by your health care provider. Make sure you discuss any questions you have with your health care provider. Document Released: 12/08/2005 Document Revised: 03/24/2016 Document Reviewed: 02/26/2014 Elsevier Interactive Patient Education  2017 [employer].

## 2017-08-08 NOTE — Telephone Encounter (Signed)
Per consult note from Dr.Rourk at [facility], pt needs follow up with [facility] in 3-4 weeks. Please cancel upcoming EGD since she just had it done on 08/04/17 and schedule ov with [facility].

## 2017-08-08 NOTE — Telephone Encounter (Signed)
Tried to call pt, no answer, LMOVM. Informed her that procedure for 08/15/17 is being canceled. OV scheduled with [facility] 08/30/17 at 11:30am. Appt letter also mailed.   LMOVM and informed Endo scheduler to cancel procedure.

## 2017-08-08 NOTE — Telephone Encounter (Signed)
Pt called office. Dr. Gala Romney done EGD 08/04/17 while she was an inpatient. She wants to know if we can cancel her EGD that is scheduled for 08/15/17.  Routing to [facility].

## 2017-08-08 NOTE — Telephone Encounter (Signed)
Pt is scheduled for pre-op appt tomorrow.

## 2017-08-08 NOTE — Telephone Encounter (Signed)
Routing to Dr. Rourk  

## 2017-08-08 NOTE — Telephone Encounter (Signed)
ASK DR. Gala Romney.

## 2017-08-10 ENCOUNTER — Encounter (HOSPITAL_COMMUNITY): Payer: Self-pay

## 2017-08-10 ENCOUNTER — Inpatient Hospital Stay (HOSPITAL_COMMUNITY)
Admission: RE | Admit: 2017-08-10 | Discharge: 2017-08-10 | Disposition: A | Discharge status: Home / Self Care | Payer: Self-pay | Source: Ambulatory Visit

## 2017-08-10 NOTE — Telephone Encounter (Signed)
Follow-up with [facility] noted. Recent EGD demonstrated a wide-open pyloric channel with a respectable pylori channel.

## 2017-08-15 ENCOUNTER — Encounter: Payer: Self-pay | Admitting: Internal Medicine

## 2017-08-15 ENCOUNTER — Ambulatory Visit (HOSPITAL_COMMUNITY): Admit: 2017-08-15 | Payer: BLUE CROSS/BLUE SHIELD | Admitting: Gastroenterology

## 2017-08-15 ENCOUNTER — Encounter (HOSPITAL_COMMUNITY): Payer: Self-pay

## 2017-08-15 SURGERY — ESOPHAGOGASTRODUODENOSCOPY (EGD) WITH PROPOFOL
Anesthesia: Monitor Anesthesia Care

## 2017-08-15 NOTE — Telephone Encounter (Signed)
REVIEWED. AGREE. NO ADDITIONAL RECOMMENDATIONS. 

## 2017-08-16 ENCOUNTER — Other Ambulatory Visit: Payer: Self-pay

## 2017-08-16 ENCOUNTER — Telehealth: Payer: Self-pay | Admitting: Gastroenterology

## 2017-08-16 DIAGNOSIS — R1030 Lower abdominal pain, unspecified: Secondary | ICD-10-CM

## 2017-08-16 NOTE — Telephone Encounter (Signed)
Pt is aware. Order was entered and she will go to [employer].

## 2017-08-16 NOTE — Telephone Encounter (Signed)
[phone] please call patient, she has a question about her medication and she is having abdominal pain.

## 2017-08-16 NOTE — Telephone Encounter (Signed)
I called pt and she said she has almost constant pain in the bottom of her stomach.  She compares it to menstrual pains, but she does not have menstrual cycles any longer. She also said she is out of the Carafate that was given her to her by an ED physician. She would like a refill if possible, it sure did help her.  Forwarding to Dr. Oneida Alar to advise!

## 2017-08-16 NOTE — Telephone Encounter (Signed)
PLEASE CALL PT. CARAFATE WILL NOT HELP LOWER ABDOMINAL PAIN. I DO NOT USE THAT DRUG AT ALL. SHE SHOULD SUBMIT A URINE SAMPLE TO CHECK FOR A UTI.

## 2017-08-23 ENCOUNTER — Ambulatory Visit: Payer: Self-pay | Admitting: Gastroenterology

## 2017-08-30 ENCOUNTER — Encounter: Payer: Self-pay | Admitting: Gastroenterology

## 2017-08-30 ENCOUNTER — Ambulatory Visit (INDEPENDENT_AMBULATORY_CARE_PROVIDER_SITE_OTHER): Payer: BLUE CROSS/BLUE SHIELD | Admitting: Gastroenterology

## 2017-08-30 DIAGNOSIS — D500 Iron deficiency anemia secondary to blood loss (chronic): Secondary | ICD-10-CM

## 2017-08-30 DIAGNOSIS — K5901 Slow transit constipation: Secondary | ICD-10-CM

## 2017-08-30 DIAGNOSIS — K279 Peptic ulcer, site unspecified, unspecified as acute or chronic, without hemorrhage or perforation: Secondary | ICD-10-CM | POA: Diagnosis not present

## 2017-08-30 NOTE — Assessment & Plan Note (Signed)
SYMPTOMS CONTROLLED/RESOLVED AS PT HAS AVOID ASA/NSAIDS ENTIRELY.  DRINK WATER TO KEEP YOUR URINE LIGHT YELLOW. It is okay to advance to a normal diet BUT AVOID HIGH FAT FOODS, WHICH MAY CAUSE HEARTBURN OR ABDOMINAL PAIN. CONTINUE PROTONIX. TAKE 30 MINUTES PRIOR TO MEALS TWICE DAILY AND TRY TO REDUCE TO ONCE DAILY Oct 31, 2017. AVOID ASPIRIN PRODUCTS. ALTERNATIVES INCLUDE: 1. TYLENOL XS 2 EVERY 8 HRS IF NEEDED FOR PAIN, 2. ULTRAM, OR 3. TYLENOL WITH A NARCOTIC. CALL WITH QUESTIONS OR CONCERNS. FOLLOW UP IN 6 MOS.

## 2017-08-30 NOTE — Progress Notes (Signed)
Subjective:    Patient ID: Deanna Berry, female    DOB: [birth year], 46 y.o.   MRN: [id number]  Redmond [employer], MD   HPI WAS TAKING GOODY POWDER & IBUPROFEN FOR BACK PAIN. NONE SINCE EGD OCT 2018. NOT HAVING BACK PAIN ANYMORE. Constipation fairly well controlled. BMS: DOING OK, EVERY OTHER DAY ON LINZESS. FEEL LIKE SHE COMPLETES HER BM. WANTS TO KNOW IF SHE NEEDS CARAFATE. TAKING PROTONIX BID EVERY DAY. AFRAID TO EAT WITHOUT IT. Had heartburn the other day that resolved with Rolaids(2).  PT DENIES FEVER, CHILLS, HEMATOCHEZIA, nausea, vomiting, melena, diarrhea, CHEST PAIN, SHORTNESS OF BREATH, CHANGE IN BOWEL IN HABITS, abdominal pain, or problems swallowing.  Past Medical History:  Diagnosis Date  . Anemia   . Anxiety   . Bipolar disorder ([place])   . Chronic abdominal pain   . Cleft palate   . Constipation   . Depression   . Gastritis   . Headache(784.0)   . Hypothyroidism   . PUD (peptic ulcer disease)   . Sinus congestion   . Substance abuse [facility])     Past Surgical History:  Procedure Laterality Date  . BIOPSY  06/06/2017   Procedure: BIOPSY;  Surgeon: Danie Binder, MD;  Location[facility];  Service: Endoscopy;;  gastric  . BIOPSY  08/04/2017   Procedure: BIOPSY;  Surgeon: Daneil Dolin, MD;  Location: [facility];  Service: Gastroenterology;;  ulcer biopsy  . CESAREAN SECTION    . CLEFT PALATE REPAIR  2013  . COLONOSCOPY  2016   Dr. Britta Mccreedy: normal   . ESOPHAGOGASTRODUODENOSCOPY  03/2015   Dr. Britta Mccreedy: esophagitis, 2 medium-sized ulcers in antrum and pylorus, large ulcer in duodenal bulb, hiatal hernia, no specimens collected.   . ESOPHAGOGASTRODUODENOSCOPY  06/2015   Dr. Britta Mccreedy: small pyloric channel ulcer with mild pyloric stenosis, previous gastric and duodenal ulcers completely healed   . ESOPHAGOGASTRODUODENOSCOPY (EGD) WITH PROPOFOL N/A 03/24/2017   [facility]: Z-line regular, small hiatal hernia, non-bleeding large clean-based pyloric channel ulcer with narrowing  of the pyloric channel, unable to advance scope into dudoenum, chronic gastritis  . ESOPHAGOGASTRODUODENOSCOPY (EGD) WITH PROPOFOL N/A 06/06/2017   Procedure: ESOPHAGOGASTRODUODENOSCOPY (EGD) WITH PROPOFOL;  Surgeon: Danie Binder, MD;  [facility];  Service: Endoscopy;  Laterality: N/A;  7:30am  . ESOPHAGOGASTRODUODENOSCOPY (EGD) WITH PROPOFOL N/A 08/04/2017   Procedure: ESOPHAGOGASTRODUODENOSCOPY (EGD) WITH PROPOFOL;  Surgeon: Daneil Dolin, MD;  [facility];  Service: Gastroenterology;  Laterality: N/A;  GI bleed; history of peptic ulcer disease  . HERNIA REPAIR    . MULTIPLE TOOTH EXTRACTIONS    . SAVORY DILATION N/A 06/06/2017   Procedure: SAVORY DILATION;  Surgeon: Danie Binder, MD;  [facility];  Service: Endoscopy;  Laterality: N/A;    Allergies  Allergen Reactions  . Amoxil [Amoxicillin] Hives      . Adhesive [Tape] Rash  . Neosporin Original [Bacitracin-Neomycin-Polymyxin] Rash    Current Outpatient Prescriptions  Medication Sig Dispense Refill  . buPROPion (WELLBUTRIN XL) 150 MG 24 hr tablet Take 150 mg by mouth every morning.    . carbamazepine (TEGRETOL XR) 400 MG 12 hr tablet Take 400 mg by mouth 2 (two) times daily.    Marland Kitchen gabapentin (NEURONTIN) 600 MG tablet Take 600 mg by mouth 3 (three) times daily.    Marland Kitchen levothyroxine (SYNTHROID, LEVOTHROID) 50 MCG tablet Take 1 tablet by mouth daily before breakfast.    . linaclotide (LINZESS) 290 MCG CAPS capsule Take 1  capsule (290 mcg total) by mouth daily before breakfast.    . Melatonin 10 MG TABS Take 1 tablet by mouth at bedtime.    . pantoprazole (PROTONIX) 40 MG tablet Take 1 tablet (40 mg total) by mouth 2 (two) times daily before a meal.    . promethazine (PHENERGAN) 12.5 MG tablet Take 1 tablet (12.5 mg total) by mouth every 6 (six) hours as needed for nausea.    Marland Kitchen QUEtiapine (SEROQUEL XR) 300 MG 24 hr tablet Take 300 mg by mouth at bedtime.    . TRINTELLIX 20 MG TABS Take 10 mg by mouth  daily.      Review of Systems PER HPI OTHERWISE ALL SYSTEMS ARE NEGATIVE.    Objective:   Physical Exam  Constitutional: She is oriented to person, place, and time. She appears well-developed and well-nourished. No distress.  HENT:  Head: Normocephalic and atraumatic.  Mouth/Throat: Oropharynx is clear and moist. No oropharyngeal exudate.  Eyes: Pupils are equal, round, and reactive to light. No scleral icterus.  Neck: Normal range of motion. Neck supple.  Cardiovascular: Normal rate, regular rhythm and normal heart sounds.   Pulmonary/Chest: Effort normal and breath sounds normal. No respiratory distress.  Abdominal: Soft. Bowel sounds are normal. She exhibits no distension. There is tenderness. There is no rebound.  MILD TTP IN THE left UQS.  Musculoskeletal: She exhibits no edema.  Lymphadenopathy:    She has no cervical adenopathy.  Neurological: She is alert and oriented to person, place, and time.  NO  NEW FOCAL DEFICITS  Psychiatric: She has a normal mood and affect.  Vitals reviewed.      Assessment & Plan:

## 2017-08-30 NOTE — Assessment & Plan Note (Signed)
IN SETTING OF PUD NOW SYMPTOMS CONTROLLED/RESOLVED.  CBC 1 WK PRIOR TO NEXT VISIT IN 6 MOS.

## 2017-08-30 NOTE — Assessment & Plan Note (Signed)
SYMPTOMS FAIRLY WELL CONTROLLED.  [place] WATER ADVANCE DIET CONTINUE LINZESS. OPV IN 6 MOS

## 2017-08-30 NOTE — Progress Notes (Signed)
ON RECALL  °

## 2017-08-30 NOTE — Patient Instructions (Addendum)
DRINK WATER TO KEEP YOUR URINE LIGHT YELLOW.  It is okay to advance to a normal diet BUT AVOID HIGH FAT FOODS, WHICH MAY CAUSE HEARTBURN OR ABDOMINAL PAIN.  CONTINUE PROTONIX. TAKE 30 MINUTES PRIOR TO MEALS TWICE DAILY AND TRY TO REDUCE TO ONCE DAILY Oct 31, 2017.  AVOID ASPIRIN PRODUCTS. ALTERNATIVES INCLUDE: 1. TYLENOL XS 2 EVERY 8 HRS IF NEEDED FOR PAIN, 2. ULTRAM, OR 3. TYLENOL WITH A NARCOTIC.  CONTINUE LINZESS.  PLEASE CALL WITH QUESTIONS OR CONCERNS.  FOLLOW UP IN 6 MOS. YOU NEED YOUR BLOOD COUNT(CBC) CHECKED 1 WEEK PRIOR TO YOUR NEXT VISIT.

## 2017-08-30 NOTE — Progress Notes (Signed)
cc'ed to pcp °

## 2017-08-31 ENCOUNTER — Other Ambulatory Visit: Payer: Self-pay

## 2017-08-31 DIAGNOSIS — D582 Other hemoglobinopathies: Secondary | ICD-10-CM

## 2017-11-06 ENCOUNTER — Telehealth: Payer: Self-pay | Admitting: Gastroenterology

## 2017-11-06 NOTE — Telephone Encounter (Signed)
FYI to Dr. Fields.  

## 2017-11-06 NOTE — Telephone Encounter (Signed)
REVIEWED-NO ADDITIONAL RECOMMENDATIONS. 

## 2017-11-06 NOTE — Telephone Encounter (Signed)
I spoke to pt's dad, Catha Brow. He said pt has been stumbling around some since Friday. But today is worse and is very lethargic and sleepy. I told him he should take her to the ED for assessment and he said he will do so.

## 2017-11-06 NOTE — Telephone Encounter (Signed)
[place] PATIENT FATHER ABOUT PATIEN, HE IS SAYING PATIENT IS LETHARGIC.

## 2017-12-15 DIAGNOSIS — F17200 Nicotine dependence, unspecified, uncomplicated: Secondary | ICD-10-CM | POA: Diagnosis not present

## 2017-12-15 DIAGNOSIS — Z79899 Other long term (current) drug therapy: Secondary | ICD-10-CM | POA: Diagnosis not present

## 2017-12-15 DIAGNOSIS — R4182 Altered mental status, unspecified: Secondary | ICD-10-CM | POA: Diagnosis not present

## 2017-12-15 DIAGNOSIS — K219 Gastro-esophageal reflux disease without esophagitis: Secondary | ICD-10-CM | POA: Diagnosis not present

## 2017-12-15 DIAGNOSIS — E876 Hypokalemia: Secondary | ICD-10-CM | POA: Diagnosis not present

## 2017-12-15 DIAGNOSIS — E860 Dehydration: Secondary | ICD-10-CM | POA: Diagnosis not present

## 2017-12-15 DIAGNOSIS — F1910 Other psychoactive substance abuse, uncomplicated: Secondary | ICD-10-CM | POA: Diagnosis not present

## 2017-12-15 DIAGNOSIS — J101 Influenza due to other identified influenza virus with other respiratory manifestations: Secondary | ICD-10-CM | POA: Diagnosis not present

## 2017-12-15 DIAGNOSIS — R05 Cough: Secondary | ICD-10-CM | POA: Diagnosis not present

## 2017-12-15 DIAGNOSIS — R410 Disorientation, unspecified: Secondary | ICD-10-CM | POA: Diagnosis not present

## 2017-12-15 DIAGNOSIS — J1089 Influenza due to other identified influenza virus with other manifestations: Secondary | ICD-10-CM | POA: Diagnosis not present

## 2017-12-15 DIAGNOSIS — F319 Bipolar disorder, unspecified: Secondary | ICD-10-CM | POA: Diagnosis not present

## 2017-12-17 DIAGNOSIS — E860 Dehydration: Secondary | ICD-10-CM | POA: Diagnosis not present

## 2017-12-17 DIAGNOSIS — R402421 Glasgow coma scale score 9-12, in the field [EMT or ambulance]: Secondary | ICD-10-CM | POA: Diagnosis not present

## 2017-12-17 DIAGNOSIS — K219 Gastro-esophageal reflux disease without esophagitis: Secondary | ICD-10-CM | POA: Diagnosis not present

## 2017-12-17 DIAGNOSIS — F319 Bipolar disorder, unspecified: Secondary | ICD-10-CM | POA: Diagnosis not present

## 2017-12-17 DIAGNOSIS — R4182 Altered mental status, unspecified: Secondary | ICD-10-CM | POA: Diagnosis not present

## 2017-12-17 DIAGNOSIS — F17200 Nicotine dependence, unspecified, uncomplicated: Secondary | ICD-10-CM | POA: Diagnosis not present

## 2017-12-17 DIAGNOSIS — R05 Cough: Secondary | ICD-10-CM | POA: Diagnosis not present

## 2017-12-17 DIAGNOSIS — F1910 Other psychoactive substance abuse, uncomplicated: Secondary | ICD-10-CM | POA: Diagnosis not present

## 2017-12-17 DIAGNOSIS — J101 Influenza due to other identified influenza virus with other respiratory manifestations: Secondary | ICD-10-CM | POA: Diagnosis not present

## 2017-12-17 DIAGNOSIS — Z720 Tobacco use: Secondary | ICD-10-CM | POA: Diagnosis not present

## 2017-12-17 DIAGNOSIS — J1089 Influenza due to other identified influenza virus with other manifestations: Secondary | ICD-10-CM | POA: Diagnosis not present

## 2017-12-17 DIAGNOSIS — Z79899 Other long term (current) drug therapy: Secondary | ICD-10-CM | POA: Diagnosis not present

## 2017-12-27 ENCOUNTER — Encounter: Payer: Self-pay | Admitting: Gastroenterology

## 2017-12-27 DIAGNOSIS — D72829 Elevated white blood cell count, unspecified: Secondary | ICD-10-CM | POA: Diagnosis not present

## 2017-12-27 DIAGNOSIS — J690 Pneumonitis due to inhalation of food and vomit: Secondary | ICD-10-CM | POA: Diagnosis not present

## 2017-12-27 DIAGNOSIS — R64 Cachexia: Secondary | ICD-10-CM | POA: Diagnosis not present

## 2017-12-27 DIAGNOSIS — J181 Lobar pneumonia, unspecified organism: Secondary | ICD-10-CM | POA: Diagnosis not present

## 2017-12-27 DIAGNOSIS — F17200 Nicotine dependence, unspecified, uncomplicated: Secondary | ICD-10-CM | POA: Diagnosis not present

## 2017-12-27 DIAGNOSIS — A4189 Other specified sepsis: Secondary | ICD-10-CM | POA: Diagnosis not present

## 2017-12-27 DIAGNOSIS — E876 Hypokalemia: Secondary | ICD-10-CM | POA: Diagnosis not present

## 2017-12-27 DIAGNOSIS — J189 Pneumonia, unspecified organism: Secondary | ICD-10-CM | POA: Diagnosis not present

## 2017-12-27 DIAGNOSIS — E039 Hypothyroidism, unspecified: Secondary | ICD-10-CM | POA: Diagnosis not present

## 2017-12-27 DIAGNOSIS — R0902 Hypoxemia: Secondary | ICD-10-CM | POA: Diagnosis not present

## 2017-12-27 DIAGNOSIS — R05 Cough: Secondary | ICD-10-CM | POA: Diagnosis not present

## 2017-12-27 DIAGNOSIS — M79622 Pain in left upper arm: Secondary | ICD-10-CM | POA: Diagnosis not present

## 2017-12-27 DIAGNOSIS — R0989 Other specified symptoms and signs involving the circulatory and respiratory systems: Secondary | ICD-10-CM | POA: Diagnosis not present

## 2017-12-27 DIAGNOSIS — R0609 Other forms of dyspnea: Secondary | ICD-10-CM | POA: Diagnosis not present

## 2017-12-27 DIAGNOSIS — R918 Other nonspecific abnormal finding of lung field: Secondary | ICD-10-CM | POA: Diagnosis not present

## 2017-12-27 DIAGNOSIS — I959 Hypotension, unspecified: Secondary | ICD-10-CM | POA: Diagnosis not present

## 2017-12-27 DIAGNOSIS — F319 Bipolar disorder, unspecified: Secondary | ICD-10-CM | POA: Diagnosis not present

## 2017-12-27 DIAGNOSIS — K219 Gastro-esophageal reflux disease without esophagitis: Secondary | ICD-10-CM | POA: Diagnosis not present

## 2017-12-27 DIAGNOSIS — Z720 Tobacco use: Secondary | ICD-10-CM | POA: Diagnosis not present

## 2017-12-27 DIAGNOSIS — R109 Unspecified abdominal pain: Secondary | ICD-10-CM | POA: Diagnosis not present

## 2017-12-27 DIAGNOSIS — Z881 Allergy status to other antibiotic agents status: Secondary | ICD-10-CM | POA: Diagnosis not present

## 2017-12-27 DIAGNOSIS — Z91048 Other nonmedicinal substance allergy status: Secondary | ICD-10-CM | POA: Diagnosis not present

## 2017-12-27 DIAGNOSIS — A419 Sepsis, unspecified organism: Secondary | ICD-10-CM | POA: Diagnosis not present

## 2017-12-27 DIAGNOSIS — Z79899 Other long term (current) drug therapy: Secondary | ICD-10-CM | POA: Diagnosis not present

## 2017-12-27 DIAGNOSIS — R0602 Shortness of breath: Secondary | ICD-10-CM | POA: Diagnosis not present

## 2017-12-27 DIAGNOSIS — D649 Anemia, unspecified: Secondary | ICD-10-CM | POA: Diagnosis not present

## 2017-12-27 DIAGNOSIS — Z1389 Encounter for screening for other disorder: Secondary | ICD-10-CM | POA: Diagnosis not present

## 2017-12-27 DIAGNOSIS — Z681 Body mass index (BMI) 19 or less, adult: Secondary | ICD-10-CM | POA: Diagnosis not present

## 2017-12-28 DIAGNOSIS — D649 Anemia, unspecified: Secondary | ICD-10-CM | POA: Diagnosis not present

## 2017-12-28 DIAGNOSIS — J189 Pneumonia, unspecified organism: Secondary | ICD-10-CM | POA: Diagnosis not present

## 2017-12-28 DIAGNOSIS — D72829 Elevated white blood cell count, unspecified: Secondary | ICD-10-CM | POA: Diagnosis not present

## 2017-12-28 DIAGNOSIS — E876 Hypokalemia: Secondary | ICD-10-CM | POA: Diagnosis not present

## 2017-12-29 DIAGNOSIS — F319 Bipolar disorder, unspecified: Secondary | ICD-10-CM | POA: Diagnosis not present

## 2017-12-29 DIAGNOSIS — D649 Anemia, unspecified: Secondary | ICD-10-CM | POA: Diagnosis not present

## 2017-12-29 DIAGNOSIS — J189 Pneumonia, unspecified organism: Secondary | ICD-10-CM | POA: Diagnosis not present

## 2017-12-30 DIAGNOSIS — F319 Bipolar disorder, unspecified: Secondary | ICD-10-CM | POA: Diagnosis not present

## 2017-12-30 DIAGNOSIS — D649 Anemia, unspecified: Secondary | ICD-10-CM | POA: Diagnosis not present

## 2017-12-30 DIAGNOSIS — J189 Pneumonia, unspecified organism: Secondary | ICD-10-CM | POA: Diagnosis not present

## 2017-12-31 DIAGNOSIS — D649 Anemia, unspecified: Secondary | ICD-10-CM | POA: Diagnosis not present

## 2017-12-31 DIAGNOSIS — F319 Bipolar disorder, unspecified: Secondary | ICD-10-CM | POA: Diagnosis not present

## 2017-12-31 DIAGNOSIS — R918 Other nonspecific abnormal finding of lung field: Secondary | ICD-10-CM | POA: Diagnosis not present

## 2017-12-31 DIAGNOSIS — J181 Lobar pneumonia, unspecified organism: Secondary | ICD-10-CM | POA: Diagnosis not present

## 2017-12-31 DIAGNOSIS — J690 Pneumonitis due to inhalation of food and vomit: Secondary | ICD-10-CM | POA: Diagnosis not present

## 2018-01-01 DIAGNOSIS — J690 Pneumonitis due to inhalation of food and vomit: Secondary | ICD-10-CM | POA: Diagnosis not present

## 2018-01-02 ENCOUNTER — Inpatient Hospital Stay (HOSPITAL_COMMUNITY)
Admission: AD | Admit: 2018-01-02 | Discharge: 2018-01-05 | DRG: 177 | Disposition: A | Discharge status: Home / Self Care | Payer: BLUE CROSS/BLUE SHIELD | Source: Other Acute Inpatient Hospital | Attending: Internal Medicine | Admitting: Internal Medicine

## 2018-01-02 ENCOUNTER — Encounter (HOSPITAL_COMMUNITY): Payer: Self-pay | Admitting: *Deleted

## 2018-01-02 ENCOUNTER — Inpatient Hospital Stay (HOSPITAL_COMMUNITY): Payer: BLUE CROSS/BLUE SHIELD

## 2018-01-02 DIAGNOSIS — F1910 Other psychoactive substance abuse, uncomplicated: Secondary | ICD-10-CM | POA: Diagnosis present

## 2018-01-02 DIAGNOSIS — Z79899 Other long term (current) drug therapy: Secondary | ICD-10-CM | POA: Diagnosis not present

## 2018-01-02 DIAGNOSIS — F1120 Opioid dependence, uncomplicated: Secondary | ICD-10-CM | POA: Diagnosis not present

## 2018-01-02 DIAGNOSIS — Z883 Allergy status to other anti-infective agents status: Secondary | ICD-10-CM

## 2018-01-02 DIAGNOSIS — F319 Bipolar disorder, unspecified: Secondary | ICD-10-CM | POA: Diagnosis not present

## 2018-01-02 DIAGNOSIS — D638 Anemia in other chronic diseases classified elsewhere: Secondary | ICD-10-CM | POA: Diagnosis present

## 2018-01-02 DIAGNOSIS — Z91048 Other nonmedicinal substance allergy status: Secondary | ICD-10-CM

## 2018-01-02 DIAGNOSIS — F419 Anxiety disorder, unspecified: Secondary | ICD-10-CM | POA: Diagnosis present

## 2018-01-02 DIAGNOSIS — F17210 Nicotine dependence, cigarettes, uncomplicated: Secondary | ICD-10-CM | POA: Diagnosis not present

## 2018-01-02 DIAGNOSIS — J189 Pneumonia, unspecified organism: Secondary | ICD-10-CM | POA: Diagnosis not present

## 2018-01-02 DIAGNOSIS — E43 Unspecified severe protein-calorie malnutrition: Secondary | ICD-10-CM | POA: Diagnosis not present

## 2018-01-02 DIAGNOSIS — J15211 Pneumonia due to Methicillin susceptible Staphylococcus aureus: Principal | ICD-10-CM | POA: Diagnosis present

## 2018-01-02 DIAGNOSIS — E875 Hyperkalemia: Secondary | ICD-10-CM | POA: Diagnosis present

## 2018-01-02 DIAGNOSIS — D473 Essential (hemorrhagic) thrombocythemia: Secondary | ICD-10-CM | POA: Diagnosis not present

## 2018-01-02 DIAGNOSIS — Z8711 Personal history of peptic ulcer disease: Secondary | ICD-10-CM | POA: Diagnosis not present

## 2018-01-02 DIAGNOSIS — Z6823 Body mass index (BMI) 23.0-23.9, adult: Secondary | ICD-10-CM | POA: Diagnosis not present

## 2018-01-02 DIAGNOSIS — E876 Hypokalemia: Secondary | ICD-10-CM | POA: Diagnosis not present

## 2018-01-02 DIAGNOSIS — R7881 Bacteremia: Secondary | ICD-10-CM | POA: Diagnosis not present

## 2018-01-02 DIAGNOSIS — Z880 Allergy status to penicillin: Secondary | ICD-10-CM | POA: Diagnosis not present

## 2018-01-02 DIAGNOSIS — Z720 Tobacco use: Secondary | ICD-10-CM | POA: Diagnosis not present

## 2018-01-02 DIAGNOSIS — E039 Hypothyroidism, unspecified: Secondary | ICD-10-CM | POA: Diagnosis not present

## 2018-01-02 DIAGNOSIS — Z87730 Personal history of (corrected) cleft lip and palate: Secondary | ICD-10-CM

## 2018-01-02 DIAGNOSIS — J984 Other disorders of lung: Secondary | ICD-10-CM | POA: Diagnosis not present

## 2018-01-02 LAB — COMPREHENSIVE METABOLIC PANEL
ALK PHOS: 151 U/L — AB (ref 38–126)
ALT: 61 U/L — AB (ref 14–54)
AST: 77 U/L — AB (ref 15–41)
Albumin: 1.8 g/dL — ABNORMAL LOW (ref 3.5–5.0)
Anion gap: 12 (ref 5–15)
BILIRUBIN TOTAL: 0.4 mg/dL (ref 0.3–1.2)
BUN: 6 mg/dL (ref 6–20)
CO2: 24 mmol/L (ref 22–32)
CREATININE: 0.63 mg/dL (ref 0.44–1.00)
Calcium: 8.1 mg/dL — ABNORMAL LOW (ref 8.9–10.3)
Chloride: 102 mmol/L (ref 101–111)
GFR calc Af Amer: >60 mL/min (ref >60)
Glucose, Bld: 120 mg/dL — ABNORMAL HIGH (ref 65–99)
Potassium: 2.7 mmol/L — CL (ref 3.5–5.1)
Sodium: 138 mmol/L (ref 135–145)
TOTAL PROTEIN: 5.4 g/dL — AB (ref 6.5–8.1)

## 2018-01-02 LAB — CBC WITH DIFFERENTIAL/PLATELET
BASOS PCT: 1 %
Basophils Absolute: 0.1 10*3/uL (ref 0.0–0.1)
EOS PCT: 3 %
Eosinophils Absolute: 0.3 10*3/uL (ref 0.0–0.7)
HEMATOCRIT: 36.1 % (ref 36.0–46.0)
HEMOGLOBIN: 12.1 g/dL (ref 12.0–15.0)
LYMPHS PCT: 26 %
Lymphs Abs: 3 10*3/uL (ref 0.7–4.0)
MCH: 28.8 pg (ref 26.0–34.0)
MCHC: 33.5 g/dL (ref 30.0–36.0)
MCV: 86 fL (ref 78.0–100.0)
MONOS PCT: 4 %
Monocytes Absolute: 0.5 10*3/uL (ref 0.1–1.0)
Neutro Abs: 7.7 10*3/uL (ref 1.7–7.7)
Neutrophils Relative %: 66 %
Platelets: 707 10*3/uL — ABNORMAL HIGH (ref 150–400)
RBC: 4.2 MIL/uL (ref 3.87–5.11)
RDW: 14.6 % (ref 11.5–15.5)
WBC: 11.6 10*3/uL — ABNORMAL HIGH (ref 4.0–10.5)

## 2018-01-02 LAB — HIV ANTIBODY (ROUTINE TESTING W REFLEX): HIV Screen 4th Generation wRfx: NONREACTIVE

## 2018-01-02 MED ORDER — CEFAZOLIN SODIUM-DEXTROSE 1-4 GM/50ML-% IV SOLN: 1.0000 g | Freq: Three times a day (TID) | INTRAVENOUS | Status: DC

## 2018-01-02 MED ORDER — POTASSIUM CHLORIDE 10 MEQ/100ML IV SOLN
10.0000 meq | INTRAVENOUS | Status: DC
  Administered 2018-01-02: 10 meq via INTRAVENOUS
  Filled 2018-01-02 (×3): qty 100

## 2018-01-02 MED ORDER — QUETIAPINE FUMARATE ER 300 MG PO TB24
300.0000 mg | ORAL_TABLET | Freq: Every day | ORAL | Status: DC
  Filled 2018-01-02: qty 1

## 2018-01-02 MED ORDER — VORTIOXETINE HBR 10 MG PO TABS
10.0000 mg | ORAL_TABLET | Freq: Every day | ORAL | Status: DC
  Filled 2018-01-02: qty 1

## 2018-01-02 MED ORDER — ENOXAPARIN SODIUM 40 MG/0.4ML ~~LOC~~ SOLN
40.0000 mg | SUBCUTANEOUS | Status: DC
  Administered 2018-01-02 – 2018-01-04 (×3): 40 mg via SUBCUTANEOUS
  Filled 2018-01-02 (×3): qty 0.4

## 2018-01-02 MED ORDER — TRAZODONE HCL 100 MG PO TABS
100.0000 mg | ORAL_TABLET | Freq: Every day | ORAL | Status: DC
  Administered 2018-01-02 – 2018-01-04 (×3): 100 mg via ORAL
  Filled 2018-01-02 (×3): qty 1

## 2018-01-02 MED ORDER — ATOMOXETINE HCL 40 MG PO CAPS
80.0000 mg | ORAL_CAPSULE | Freq: Every day | ORAL | Status: DC
  Administered 2018-01-02 – 2018-01-05 (×4): 80 mg via ORAL
  Filled 2018-01-02 (×5): qty 2

## 2018-01-02 MED ORDER — GABAPENTIN 300 MG PO CAPS
600.0000 mg | ORAL_CAPSULE | Freq: Two times a day (BID) | ORAL | Status: DC
  Administered 2018-01-02 – 2018-01-05 (×7): 600 mg via ORAL
  Filled 2018-01-02 (×2): qty 2
  Filled 2018-01-02: qty 6
  Filled 2018-01-02 (×4): qty 2
  Filled 2018-01-02: qty 6

## 2018-01-02 MED ORDER — CARBAMAZEPINE ER 200 MG PO TB12
400.0000 mg | ORAL_TABLET | Freq: Two times a day (BID) | ORAL | Status: DC
  Administered 2018-01-02 – 2018-01-05 (×8): 400 mg via ORAL
  Filled 2018-01-02 (×8): qty 2

## 2018-01-02 MED ORDER — CEFAZOLIN SODIUM-DEXTROSE 2-4 GM/100ML-% IV SOLN
2.0000 g | Freq: Three times a day (TID) | INTRAVENOUS | Status: DC
  Filled 2018-01-02: qty 100

## 2018-01-02 MED ORDER — POTASSIUM CHLORIDE CRYS ER 20 MEQ PO TBCR
40.0000 meq | EXTENDED_RELEASE_TABLET | Freq: Two times a day (BID) | ORAL | Status: DC
  Administered 2018-01-02 – 2018-01-03 (×4): 40 meq via ORAL
  Filled 2018-01-02 (×4): qty 2

## 2018-01-02 MED ORDER — LINACLOTIDE 145 MCG PO CAPS
290.0000 ug | ORAL_CAPSULE | Freq: Every day | ORAL | Status: DC
  Administered 2018-01-03 – 2018-01-05 (×3): 290 ug via ORAL
  Filled 2018-01-02 (×3): qty 2

## 2018-01-02 MED ORDER — CEFAZOLIN SODIUM-DEXTROSE 2-4 GM/100ML-% IV SOLN
2.0000 g | Freq: Three times a day (TID) | INTRAVENOUS | Status: DC
  Administered 2018-01-02 – 2018-01-05 (×10): 2 g via INTRAVENOUS
  Filled 2018-01-02 (×11): qty 100

## 2018-01-02 MED ORDER — POTASSIUM CHLORIDE CRYS ER 20 MEQ PO TBCR: 40.0000 meq | EXTENDED_RELEASE_TABLET | Freq: Once | ORAL | Status: DC

## 2018-01-02 MED ORDER — PANTOPRAZOLE SODIUM 40 MG PO TBEC
40.0000 mg | DELAYED_RELEASE_TABLET | Freq: Every day | ORAL | Status: DC
  Administered 2018-01-02 – 2018-01-05 (×4): 40 mg via ORAL
  Filled 2018-01-02 (×4): qty 1

## 2018-01-02 MED ORDER — LEVOTHYROXINE SODIUM 50 MCG PO TABS
50.0000 ug | ORAL_TABLET | Freq: Every day | ORAL | Status: DC
  Administered 2018-01-02 – 2018-01-05 (×4): 50 ug via ORAL
  Filled 2018-01-02 (×4): qty 1

## 2018-01-02 MED ORDER — SODIUM CHLORIDE 0.9 % IV SOLN
INTRAVENOUS | Status: DC
  Administered 2018-01-02 (×2): via INTRAVENOUS

## 2018-01-02 MED ORDER — QUETIAPINE FUMARATE ER 300 MG PO TB24
600.0000 mg | ORAL_TABLET | Freq: Every day | ORAL | Status: DC
  Administered 2018-01-02 – 2018-01-04 (×3): 600 mg via ORAL
  Filled 2018-01-02 (×3): qty 2

## 2018-01-02 MED ORDER — HYDROCODONE-ACETAMINOPHEN 5-325 MG PO TABS
1.0000 | ORAL_TABLET | Freq: Four times a day (QID) | ORAL | Status: DC | PRN
  Administered 2018-01-02: 2 via ORAL
  Administered 2018-01-02: 1 via ORAL
  Administered 2018-01-03 – 2018-01-04 (×6): 2 via ORAL
  Filled 2018-01-02 (×6): qty 2
  Filled 2018-01-02: qty 1
  Filled 2018-01-02 (×3): qty 2

## 2018-01-02 MED ORDER — PANTOPRAZOLE SODIUM 40 MG PO TBEC: 40.0000 mg | DELAYED_RELEASE_TABLET | Freq: Two times a day (BID) | ORAL | Status: DC

## 2018-01-02 MED ORDER — BUPROPION HCL ER (XL) 150 MG PO TB24
150.0000 mg | ORAL_TABLET | Freq: Every morning | ORAL | Status: DC
  Administered 2018-01-02 – 2018-01-04 (×3): 150 mg via ORAL
  Filled 2018-01-02 (×4): qty 1

## 2018-01-02 NOTE — Consult Note (Signed)
Name: Deanna Berry MRN: [id number] DOB: 1971-06-27    ADMISSION DATE:  01/02/2018 CONSULTATION DATE:  3/5 REFERRING MD :  Verlon Au  CHIEF COMPLAINT:   Cavitary pneumonia  BRIEF PATIENT DESCRIPTION:  This is a 47 year old female with a prior history of substance abuse, bipolar disease,And anxiety.  Was initially admitted to [facility] on 2/27 with left-sided multi lobar pneumonia, sepsis, and hypotension.  She was initially treated with Rocephin and azithromycin, she improved and as far as her hypotension and white blood cell count improved as has her cough and chest pain although still has significant discomfort w/ deep breath and cough.   Sputum grew MSSA pneumonia. But CXR in comparison from 2/27 to 3/3 looked a little worse so  A CT chest was obtained on 3/3 this demonstrated multifocal bilatera cavitary, pneumonia Left worse than right.  She was changed to [place] and clinda, then transferred to [facility] for consideration for bronchoscopy.  Pulmonary asked to evaluate.  SIGNIFICANT EVENTS   Culture data Sputum 3/1: mssa 2/27 BC X1: negative  abx azith 2/27>>>3/3 Rocephin 2/27>>3/3 Meropenem 3/3>>> clinda 3/3>>>  STUDIES:  CT chest 3/3: The left-sided pulmonary opacities seen on recent chest x-ray demonstrate central necrosis/cavitation on today's study. Some are nodular and/or masslike while others are more confluent. A few small nodules, at least 1 of which is cavitary, on the right are identified as well. The somewhat confluent nature of the opacities on the left in addition to the asymmetry in the opacities, left much greater than right, suggest a multifocal infectious process. Atypical infections including tuberculosis and aspergillosis should be considered. The asymmetry would be unusual for septic emboli but the findings are bilateral. 2. Probable reactive nodes in the left hilum and mediastinum. 3. Ground-glass in the right lung is likely infectious  or inflammatory.     HISTORY OF PRESENT ILLNESS:  See above   PAST MEDICAL HISTORY :   has a past medical history of Anemia, Anxiety, Bipolar disorder ([place]), Chronic abdominal pain, Cleft palate, Constipation, Depression, Gastritis, Headache(784.0), Hypothyroidism, PUD (peptic ulcer disease), Sinus congestion, and Substance abuse ([place]).  has a past surgical history that includes Cesarean section; Hernia repair; Colonoscopy (2016); Esophagogastroduodenoscopy (03/2015); Esophagogastroduodenoscopy (06/2015); Esophagogastroduodenoscopy (egd) with propofol (N/A, 03/24/2017); Multiple tooth extractions; Cleft palate repair (2013); Esophagogastroduodenoscopy (egd) with propofol (N/A, 06/06/2017); Savory dilation (N/A, 06/06/2017); biopsy (06/06/2017); Esophagogastroduodenoscopy (egd) with propofol (N/A, 08/04/2017); and biopsy (08/04/2017). Prior to Admission medications   Medication Sig Start Date End Date Taking? Authorizing Provider  buPROPion (WELLBUTRIN XL) 150 MG 24 hr tablet Take 150 mg by mouth every morning. 08/08/17   [provider]  carbamazepine (TEGRETOL XR) 400 MG 12 hr tablet Take 400 mg by mouth 2 (two) times daily. 05/19/17   [provider]  gabapentin (NEURONTIN) 600 MG tablet Take 600 mg by mouth 3 (three) times daily.    [provider]  levothyroxine (SYNTHROID, LEVOTHROID) 50 MCG tablet Take 1 tablet by mouth daily before breakfast. 06/29/17   [provider]  linaclotide Rolan Lipa) 290 MCG CAPS capsule Take 1 capsule (290 mcg total) by mouth daily before breakfast. 03/28/17   Annitta Needs, NP  Melatonin 10 MG TABS Take 1 tablet by mouth at bedtime.    [provider]  pantoprazole (PROTONIX) 40 MG tablet Take 1 tablet (40 mg total) by mouth 2 (two) times daily before a meal. 03/30/17   Carlis Stable, NP  promethazine (PHENERGAN) 12.5 MG tablet Take 1 tablet (12.5 mg  total) by mouth every 6 (six) hours as needed for nausea. 07/07/17   Kathie Dike,  MD  QUEtiapine (SEROQUEL XR) 300 MG 24 hr tablet Take 300 mg by mouth at bedtime. 05/29/17   [provider]  TRINTELLIX 20 MG TABS Take 10 mg by mouth daily.  02/10/17   [provider]   Allergies  Allergen Reactions  . Amoxil [Amoxicillin] Hives    Has patient had a PCN reaction causing immediate rash, facial/tongue/throat swelling, SOB or lightheadedness with hypotension: No Has patient had a PCN reaction causing severe rash involving mucus membranes or skin necrosis: No Has patient had a PCN reaction that required hospitalization: No Has patient had a PCN reaction occurring within the last 10 years: No If all of the above answers are "NO", then may proceed with Cephalosporin use.   . Adhesive [Tape] Rash  . Neosporin Original [Bacitracin-Neomycin-Polymyxin] Rash    FAMILY HISTORY:  family history includes Colon cancer (age of onset: 84) in her mother. SOCIAL HISTORY:  reports that she has been smoking cigarettes.  She has a 31.00 pack-year smoking history. she has never used smokeless tobacco. She reports that she does not drink alcohol or use drugs.  REVIEW OF SYSTEMS:   Constitutional: Negative for fever, chills, weight loss, malaise/fatigue and diaphoresis.  HENT: Negative for hearing loss, ear pain, nosebleeds, congestion, sore throat, neck pain, tinnitus and ear discharge.   Eyes: Negative for blurred vision, double vision, photophobia, pain, discharge and redness.  Respiratory: + cough, hemoptysis, sputum production, +shortness of breath, + left sided pleuritic chest pain. No wheezing and stridor.   Cardiovascular: Negative for chest pain, palpitations, orthopnea, claudication, leg swelling and PND.  Gastrointestinal: Negative for heartburn, nausea, vomiting, abdominal pain, diarrhea, constipation, blood in stool and melena.  Genitourinary: Negative for dysuria, urgency, frequency, hematuria and flank pain.  Musculoskeletal: Negative for myalgias, back pain,  joint pain and falls.  Skin: Negative for itching and rash.  Neurological: Negative for dizziness, tingling, tremors, sensory change, speech change, focal weakness, seizures, loss of consciousness, weakness and headaches.  Endo/Heme/Allergies: Negative for environmental allergies and polydipsia. Does not bruise/bleed easily.  SUBJECTIVE:  Feels a little better since original admit  VITAL SIGNS: Temp:  [97.8 F (36.6 C)-98.9 F (37.2 C)] 98.9 F (37.2 C) (03/05 0506) Pulse Rate:  [100-103] 100 (03/05 0506) BP: (82-92)/(56) 92/56 (03/05 0506) SpO2:  [92 %-95 %] 95 % (03/05 0506)  PHYSICAL EXAMINATION: General:  Frail 47 year old female. Chronically ill appearing but in no acute distress  Neuro:  Awake and oriented. No focal def  HEENT:  NCAT, poor dentition. Cleft palate w/ airy nasal sounding phonation  Cardiovascular:  RRR Lungs:  Scattered rhonchi and rales posteriorly  Abdomen:  Soft not tender  Musculoskeletal:  Equal st and bulk  Skin:  Warm and dry   Recent Labs  Lab 01/02/18 0240  NA 138  K 2.7*  CL 102  CO2 24  BUN 6  CREATININE 0.63  GLUCOSE 120*   Recent Labs  Lab 01/02/18 0240  HGB 12.1  HCT 36.1  WBC 11.6*  PLT 707*   No results found.  ASSESSMENT / PLAN:  Multifocal cavitary MSSA pneumonia.  W/ clinical improvement as far as wbc ct, fever, hemodynamics, cough and pain,  but limited radiographic studies w/ progression. This is not surprising. Will take several weeks to resolve and expect progress to be slow. Her blood culture was negative, she states she does not inject but radiographic findings  raise concern for possibility of septic emboli.  Plan Agree w/ ancef. Will need at least 4 weeks of therapy & would re-CT her at 3-4 weeks. W/ plan to cont abx until she has radiographic resolution Will get echo for completeness. To r/o endocarditis  F/u HIV panel-->if reactive could then consider bronch but yield of bronch after the antibiotics she has been on  would be poor.  Add motrin for pain   Erick Colace ACNP-BC [place] Pager # [phone] OR # [phone] if no answer   01/02/2018, 11:04 AM

## 2018-01-02 NOTE — H&P (Signed)
History and Physical    Deanna Berry NTI:[id number] DOB: 1971-07-22 DOA: 01/02/2018  PCP: Redmond School, MD  Patient coming from: [place] have personally briefly reviewed patient's old medical records in [place]  Chief Complaint: Pneumonia  HPI: Deanna Berry is a 47 y.o. female with medical history significant of substance abuse, BPD.  Patient was admitted to [facility] on 2/27 with L sided multilobar PNA.  She was treated with rocephin and azithromycin.  WBC did apparently trend down and sepsis resolve; however, cough, congestion, and PNA symptoms were still present despite this.  She was changed to merrem and clinda.  CT chest shows multifocal cavitary lesions in L lung.  She was transferred to [facility] for possible bronchoscopy.  Sputum cultures have grown abundant MSSA.   Review of Systems: As per HPI otherwise 10 point review of systems negative.   Past Medical History:  Diagnosis Date  . Anemia   . Anxiety   . Bipolar disorder ([place])   . Chronic abdominal pain   . Cleft palate   . Constipation   . Depression   . Gastritis   . Headache(784.0)   . Hypothyroidism   . PUD (peptic ulcer disease)   . Sinus congestion   . Substance abuse [facility])     Past Surgical History:  Procedure Laterality Date  . BIOPSY  06/06/2017   Procedure: BIOPSY;  Surgeon: Danie Binder, MD;  [facility];  Service: Endoscopy;;  gastric  . BIOPSY  08/04/2017   Procedure: BIOPSY;  Surgeon: Daneil Dolin, MD;  Location: [facility];  Service: Gastroenterology;;  ulcer biopsy  . CESAREAN SECTION    . CLEFT PALATE REPAIR  2013  . COLONOSCOPY  2016   Dr. Britta Mccreedy: normal   . ESOPHAGOGASTRODUODENOSCOPY  03/2015   Dr. Britta Mccreedy: esophagitis, 2 medium-sized ulcers in antrum and pylorus, large ulcer in duodenal bulb, hiatal hernia, no specimens collected.   . ESOPHAGOGASTRODUODENOSCOPY  06/2015   Dr. Britta Mccreedy: small pyloric channel ulcer with mild pyloric stenosis, previous  gastric and duodenal ulcers completely healed   . ESOPHAGOGASTRODUODENOSCOPY (EGD) WITH PROPOFOL N/A 03/24/2017   [facility]: Z-line regular, small hiatal hernia, non-bleeding large clean-based pyloric channel ulcer with narrowing of the pyloric channel, unable to advance scope into dudoenum, chronic gastritis  . ESOPHAGOGASTRODUODENOSCOPY (EGD) WITH PROPOFOL N/A 06/06/2017   Procedure: ESOPHAGOGASTRODUODENOSCOPY (EGD) WITH PROPOFOL;  Surgeon: Danie Binder, MD;  [facility];  Service: Endoscopy;  Laterality: N/A;  7:30am  . ESOPHAGOGASTRODUODENOSCOPY (EGD) WITH PROPOFOL N/A 08/04/2017   Procedure: ESOPHAGOGASTRODUODENOSCOPY (EGD) WITH PROPOFOL;  Surgeon: Daneil Dolin, MD;  [facility];  Service: Gastroenterology;  Laterality: N/A;  GI bleed; history of peptic ulcer disease  . HERNIA REPAIR    . MULTIPLE TOOTH EXTRACTIONS    . SAVORY DILATION N/A 06/06/2017   Procedure: SAVORY DILATION;  Surgeon: Danie Binder, MD;  [facility];  Service: Endoscopy;  Laterality: N/A;     reports that she has been smoking cigarettes.  She has a 31.00 pack-year smoking history. she has never used smokeless tobacco. She reports that she does not drink alcohol or use drugs.  Allergies  Allergen Reactions  . Amoxil [Amoxicillin] Hives    Has patient had a PCN reaction causing immediate rash, facial/tongue/throat swelling, SOB or lightheadedness with hypotension: No Has patient had a PCN reaction causing severe rash involving mucus membranes or skin necrosis: No Has patient had a PCN reaction that  required hospitalization: No Has patient had a PCN reaction occurring within the last 10 years: No If all of the above answers are "NO", then may proceed with Cephalosporin use.   . Adhesive [Tape] Rash  . Neosporin Original [Bacitracin-Neomycin-Polymyxin] Rash    Family History  Problem Relation Age of Onset  . Colon cancer Mother 4     Prior to Admission medications     Medication Sig Start Date End Date Taking? Authorizing Provider  buPROPion (WELLBUTRIN XL) 150 MG 24 hr tablet Take 150 mg by mouth every morning. 08/08/17   [provider]  carbamazepine (TEGRETOL XR) 400 MG 12 hr tablet Take 400 mg by mouth 2 (two) times daily. 05/19/17   [provider]  gabapentin (NEURONTIN) 600 MG tablet Take 600 mg by mouth 3 (three) times daily.    [provider]  levothyroxine (SYNTHROID, LEVOTHROID) 50 MCG tablet Take 1 tablet by mouth daily before breakfast. 06/29/17   [provider]  linaclotide Rolan Lipa) 290 MCG CAPS capsule Take 1 capsule (290 mcg total) by mouth daily before breakfast. 03/28/17   Annitta Needs, NP  Melatonin 10 MG TABS Take 1 tablet by mouth at bedtime.    [provider]  pantoprazole (PROTONIX) 40 MG tablet Take 1 tablet (40 mg total) by mouth 2 (two) times daily before a meal. 03/30/17   Carlis Stable, NP  promethazine (PHENERGAN) 12.5 MG tablet Take 1 tablet (12.5 mg total) by mouth every 6 (six) hours as needed for nausea. 07/07/17   Kathie Dike, MD  QUEtiapine (SEROQUEL XR) 300 MG 24 hr tablet Take 300 mg by mouth at bedtime. 05/29/17   [provider]  TRINTELLIX 20 MG TABS Take 10 mg by mouth daily.  02/10/17   [provider]    Physical Exam: Vitals:   01/02/18 0047  BP: (!) 82/56  Pulse: (!) 103  Temp: 97.8 F (36.6 C)  TempSrc: Oral  SpO2: 92%    Constitutional: NAD, calm, comfortable Eyes: PERRL, lids and conjunctivae normal ENMT: Mucous membranes are moist. Posterior pharynx clear of any exudate or lesions.Normal dentition.  Neck: normal, supple, no masses, no thyromegaly Respiratory: clear to auscultation bilaterally, no wheezing, no crackles. Normal respiratory effort. No accessory muscle use.  Cardiovascular: Regular rate and rhythm, no murmurs / rubs / gallops. No extremity edema. 2+ pedal pulses. No carotid bruits.  Abdomen: no tenderness, no masses palpated.  No hepatosplenomegaly. Bowel sounds positive.  Musculoskeletal: no clubbing / cyanosis. No joint deformity upper and lower extremities. Good ROM, no contractures. Normal muscle tone.  Skin: no rashes, lesions, ulcers. No induration Neurologic: CN 2-12 grossly intact. Sensation intact, DTR normal. Strength 5/5 in all 4.  Psychiatric: Normal judgment and insight. Alert and oriented x 3. Normal mood.    Labs on Admission: I have personally reviewed following labs and imaging studies  CBC: No results for input(s): WBC, NEUTROABS, HGB, HCT, MCV, PLT in the last 168 hours. Basic Metabolic Panel: No results for input(s): NA, K, CL, CO2, GLUCOSE, BUN, CREATININE, CALCIUM, MG, PHOS in the last 168 hours. GFR: CrCl cannot be calculated (Patient's most recent lab result is older than the maximum 21 days allowed.). Liver Function Tests: No results for input(s): AST, ALT, ALKPHOS, BILITOT, PROT, ALBUMIN in the last 168 hours. No results for input(s): LIPASE, AMYLASE in the last 168 hours. No results for input(s): AMMONIA in the last 168 hours. Coagulation Profile: No results for input(s): INR, PROTIME in the last 168  hours. Cardiac Enzymes: No results for input(s): CKTOTAL, CKMB, CKMBINDEX, TROPONINI in the last 168 hours. BNP (last 3 results) No results for input(s): PROBNP in the last 8760 hours. HbA1C: No results for input(s): HGBA1C in the last 72 hours. CBG: No results for input(s): GLUCAP in the last 168 hours. Lipid Profile: No results for input(s): CHOL, HDL, LDLCALC, TRIG, CHOLHDL, LDLDIRECT in the last 72 hours. Thyroid Function Tests: No results for input(s): TSH, T4TOTAL, FREET4, T3FREE, THYROIDAB in the last 72 hours. Anemia Panel: No results for input(s): VITAMINB12, FOLATE, FERRITIN, TIBC, IRON, RETICCTPCT in the last 72 hours. Urine analysis:    Component Value Date/Time   COLORURINE YELLOW 07/31/2017 1040   APPEARANCEUR HAZY (A) 07/31/2017 1040   LABSPEC 1.015 07/31/2017  1040   PHURINE 5.0 07/31/2017 1040   GLUCOSEU NEGATIVE 07/31/2017 1040   HGBUR NEGATIVE 07/31/2017 1040   BILIRUBINUR NEGATIVE 07/31/2017 1040   KETONESUR NEGATIVE 07/31/2017 1040   PROTEINUR NEGATIVE 07/31/2017 1040   UROBILINOGEN 1.0 12/11/2012 1744   NITRITE NEGATIVE 07/31/2017 1040   LEUKOCYTESUR LARGE (A) 07/31/2017 1040    Radiological Exams on Admission: No results found.  EKG: Independently reviewed.  Assessment/Plan Principal Problem:   Pneumonia of left lung due to methicillin susceptible Staphylococcus aureus (MSSA) (HCC) Active Problems:   Narcotic dependence ([place])    1. PNA of L lung - with cavitary lesions, sputum cultures growing MSSA; MSSA PNA would be the simplest explanation to the partial response to rocephin, distribution of PNA. 1. Will put on Ancef for the moment 2. ID consult in AM 3. Possible Pulm consult depending on what ID thinks 4. BCx pending - though I would expect a more bilateral presentation for septic pulmonary emboli. 5. Will leave in airborne isolation for the moment. 6. IVF: NS at 75 cc/hr 7. CBC and BMP pending 8. HIV pending 2. Narcotic dependence - 1. Norco for pain control 3. BPD - 1. Continue home meds including neurontin, seroquel, tegretol.  DVT prophylaxis: Lovenox Code Status: Full Family Communication: No family in room Disposition Plan: Home after admit Consults called: None Admission status: Admit to inpatient   Etta Quill DO [facility] Pager [phone]  If 7AM-7PM, please contact day team taking care of patient [email or web address] Password [email or web address]  01/02/2018, 1:59 AM

## 2018-01-02 NOTE — Progress Notes (Signed)
Seen examined  Patient known prior SA, prior methadone ~ 5 yr prior, Anemjia 2/2 to GIB in past, Bipolar, smoker, admit 2.27.19 to [facility] with mutlifocal PNA  She was Tx 2 U prbc for hemoglobin 7.6 and placed initially on empiric coverage which was changed to merrem and clinda 2/2 to mutlifocal pna and minimal improvmeent was sent over for pulm eval HIV and othe rlabs pending  P Called pulm-will eval for bronchwbc down from 12-->11 no fever--hold ID consult Hypokalemia-replace orally kdur 40 bid  Verneita Griffes, MD [facility] [phone]

## 2018-01-03 ENCOUNTER — Inpatient Hospital Stay (HOSPITAL_COMMUNITY): Payer: BLUE CROSS/BLUE SHIELD

## 2018-01-03 DIAGNOSIS — Z720 Tobacco use: Secondary | ICD-10-CM

## 2018-01-03 DIAGNOSIS — J15211 Pneumonia due to Methicillin susceptible Staphylococcus aureus: Principal | ICD-10-CM

## 2018-01-03 LAB — COMPREHENSIVE METABOLIC PANEL
ALBUMIN: 1.8 g/dL — AB (ref 3.5–5.0)
ALT: 41 U/L (ref 14–54)
ANION GAP: 10 (ref 5–15)
AST: 39 U/L (ref 15–41)
Alkaline Phosphatase: 130 U/L — ABNORMAL HIGH (ref 38–126)
BUN: <5 mg/dL — ABNORMAL LOW (ref 6–20)
CO2: 23 mmol/L (ref 22–32)
Calcium: 8 mg/dL — ABNORMAL LOW (ref 8.9–10.3)
Chloride: 108 mmol/L (ref 101–111)
Creatinine, Ser: 0.49 mg/dL (ref 0.44–1.00)
GFR calc Af Amer: >60 mL/min (ref >60)
GFR calc non Af Amer: >60 mL/min (ref >60)
GLUCOSE: 80 mg/dL (ref 65–99)
POTASSIUM: 4.3 mmol/L (ref 3.5–5.1)
SODIUM: 141 mmol/L (ref 135–145)
Total Bilirubin: 0.4 mg/dL (ref 0.3–1.2)
Total Protein: 5.2 g/dL — ABNORMAL LOW (ref 6.5–8.1)

## 2018-01-03 LAB — MAGNESIUM: Magnesium: 1.6 mg/dL — ABNORMAL LOW (ref 1.7–2.4)

## 2018-01-03 MED ORDER — MAGNESIUM SULFATE 2 GM/50ML IV SOLN
2.0000 g | Freq: Once | INTRAVENOUS | Status: AC
  Administered 2018-01-03: 2 g via INTRAVENOUS
  Filled 2018-01-03: qty 50

## 2018-01-03 MED ORDER — IBUPROFEN 200 MG PO TABS: 400.0000 mg | ORAL_TABLET | ORAL | Status: DC | PRN

## 2018-01-03 NOTE — Plan of Care (Signed)
  Progressing Education: Knowledge of General Education information will improve 01/03/2018 1104 - Progressing by Rance Muir, RN Health Behavior/Discharge Planning: Ability to manage health-related needs will improve 01/03/2018 1104 - Progressing by Rance Muir, RN Clinical Measurements: Ability to maintain clinical measurements within normal limits will improve 01/03/2018 1104 - Progressing by Rance Muir, RN Will remain free from infection 01/03/2018 1104 - Progressing by Rance Muir, RN Diagnostic test results will improve 01/03/2018 1104 - Progressing by Rance Muir, RN Respiratory complications will improve 01/03/2018 1104 - Progressing by Rance Muir, RN Cardiovascular complication will be avoided 01/03/2018 1104 - Progressing by Rance Muir, RN Activity: Risk for activity intolerance will decrease 01/03/2018 1104 - Progressing by Rance Muir, RN Nutrition: Adequate nutrition will be maintained 01/03/2018 1104 - Progressing by Rance Muir, RN Coping: Level of anxiety will decrease 01/03/2018 1104 - Progressing by Rance Muir, RN Elimination: Will not experience complications related to bowel motility 01/03/2018 1104 - Progressing by Rance Muir, RN Will not experience complications related to urinary retention 01/03/2018 1104 - Progressing by Rance Muir, RN Pain Managment: General experience of comfort will improve 01/03/2018 1104 - Progressing by Rance Muir, RN Safety: Ability to remain free from injury will improve 01/03/2018 1104 - Progressing by Rance Muir, RN Skin Integrity: Risk for impaired skin integrity will decrease 01/03/2018 1104 - Progressing by Rance Muir, RN

## 2018-01-03 NOTE — Plan of Care (Signed)
  Education: Knowledge of General Education information will improve 01/03/2018 0556 - Progressing by Anson Fret, RN Note POC reviewed with pt.

## 2018-01-03 NOTE — Progress Notes (Addendum)
PROGRESS NOTE   Deanna Berry  AYT:[id number]    DOB: Mar 29, 1971    DOA: 01/02/2018  PCP: Redmond School, MD   I have briefly reviewed patients previous medical records in [facility].  Brief Narrative:  47 year old female with PMH of bipolar disorder, PUD/gastritis, hypothyroid, substance abuse was admitted to [facility] on 2/27 with left-sided multi lobar pneumonia, sepsis with MSSA, did not adequately improve and hence transferred to [facility] for further treatment. PCCM consulting.   Assessment & Plan:   Principal Problem:   Pneumonia of left lung due to methicillin susceptible Staphylococcus aureus (MSSA) ([place]) Active Problems:   Narcotic dependence ([place])   MSSA cavitary pneumonia: PCCM follow-up appreciated >improving on cefazolin, very low index of suspicion for TB, do not see need for bronchoscopy at this time, may send sputum for AFB if able to provide sample to further determined that she does not have TB but if she cannot be released from airborne isolation because radiology report said TB and differential, call CCM back for bronchoscopy.  HIV screen negative.  Blood cultures x2: Negative to date.  Bipolar disorder: Stable.  Continue home Strattera, Wellbutrin, Seroquel and trazodone.  Patient also on Tegretol and gabapentin?  Due to pain versus bipolar.  Outpatient follow-up.  Substance abuse/tobacco: Cessation counseled.  Patient denies abusing any other drugs.  Hypokalemia: Replaced.  Hypomagnesemia: Replace and follow.  Severe protein calorie malnutrition: Dietitian consultation.  Narcotic dependence: Judicious use of opioids.     DVT prophylaxis: Lovenox Code Status: Full Family Communication: None at bedside Disposition: To home pending clinical improvement, possibly in the next 2-3 days.   Consultants:  PCCM-signed off 3/6  Procedures:  None  Antimicrobials:  IV cefazolin   Subjective: Reports left upper chest pain on deep inspiration or coughing.   No dyspnea.  Cough with minimal intermittent white sputum.  No fever or chills.  ROS: Above.  Objective:  Vitals:   01/02/18 1500 01/02/18 2108 01/03/18 0500 01/03/18 1355  BP: 99/67 103/64 93/67 113/67  Pulse: 73 85 69 68  Resp:  16 13 15   Temp: 98.2 F (36.8 C) (!) 97.3 F (36.3 C) (!) 97.5 F (36.4 C) 98.9 F (37.2 C)  TempSrc: Oral Oral Oral Oral  SpO2: 95% 97% 97% 98%    Examination:  General exam: Young female, small built, thinly nourished, lying comfortably propped up in bed. Respiratory system: Reduced breath sounds with coarse crackles left upper lung fields.  Rest of lung fields clear to auscultation. Respiratory effort normal. Cardiovascular system: S1 & S2 heard, RRR. No JVD, murmurs, rubs, gallops or clicks. No pedal edema. Gastrointestinal system: Abdomen is nondistended, soft and nontender. No organomegaly or masses felt. Normal bowel sounds heard. Central nervous system: Alert and oriented. No focal neurological deficits. Extremities: Symmetric 5 x 5 power. Skin: No rashes, lesions or ulcers Psychiatry: Judgement and insight appear normal. Mood & affect appropriate.     Data Reviewed: I have personally reviewed following labs and imaging studies  CBC: Recent Labs  Lab 01/02/18 0240  WBC 11.6*  NEUTROABS 7.7  HGB 12.1  HCT 36.1  MCV 86.0  PLT 355*   Basic Metabolic Panel: Recent Labs  Lab 01/02/18 0240 01/03/18 0805  NA 138 141  K 2.7* 4.3  CL 102 108  CO2 24 23  GLUCOSE 120* 80  BUN 6 <5*  CREATININE 0.63 0.49  CALCIUM 8.1* 8.0*  MG  --  1.6*   Liver Function Tests: Recent Labs  Lab 01/02/18  0240 01/03/18 0805  AST 77* 39  ALT 61* 41  ALKPHOS 151* 130*  BILITOT 0.4 0.4  PROT 5.4* 5.2*  ALBUMIN 1.8* 1.8*     Recent Results (from the past 240 hour(s))  Culture, blood (routine x 2) Call MD if unable to obtain prior to antibiotics being given     Status: None (Preliminary result)   Collection Time: 01/02/18  2:40 AM  Result  Value Ref Range Status   Specimen Description BLOOD RIGHT ARM  Final   Special Requests IN PEDIATRIC BOTTLE Blood Culture adequate volume  Final   Culture   Final    NO GROWTH 1 DAY Performed at [facility], [place] [address] [place], [place] [address]    Report Status PENDING  Incomplete  Culture, blood (routine x 2) Call MD if unable to obtain prior to antibiotics being given     Status: None (Preliminary result)   Collection Time: 01/02/18  2:47 AM  Result Value Ref Range Status   Specimen Description BLOOD RIGHT HAND  Final   Special Requests IN PEDIATRIC BOTTLE Blood Culture adequate volume  Final   Culture   Final    NO GROWTH 1 DAY Performed at [facility], [place] [address] [place], [place] [address]    Report Status PENDING  Incomplete         Radiology Studies: No results found.      Scheduled Meds: . atomoxetine  80 mg Oral Daily  . buPROPion  150 mg Oral q morning - 10a  . carbamazepine  400 mg Oral BID  . enoxaparin (LOVENOX) injection  40 mg Subcutaneous Q24H  . gabapentin  600 mg Oral BID  . levothyroxine  50 mcg Oral QAC breakfast  . linaclotide  290 mcg Oral QAC breakfast  . pantoprazole  40 mg Oral Daily  . potassium chloride  40 mEq Oral BID  . QUEtiapine  600 mg Oral QHS  . traZODone  100 mg Oral QHS   Continuous Infusions: . sodium chloride 75 mL/hr at 01/02/18 2250  .  ceFAZolin (ANCEF) IV 2 g (01/03/18 4967)     LOS: 1 day     Vernell Leep, MD, FACP, [facility] Pager [phone]  If 7PM-7AM, please contact night-coverage [email or web address] Password [email or web address] 01/03/2018, 4:07 PM

## 2018-01-03 NOTE — Consult Note (Addendum)
Name: Neytiri Asche MRN: [id number] DOB: 1971/06/03    ADMISSION DATE:  01/02/2018 CONSULTATION DATE:  3/5 REFERRING MD :  Verlon Au  CHIEF COMPLAINT:   Cavitary pneumonia  BRIEF PATIENT DESCRIPTION:  47 yo female admitted to [facility] with PNA, sepsis with MSSA.  She was transferred to [facility] for further Tx.  Hx of bipolar, anxiety, and polysubstance abuse.  SUBJECTIVE:  Wants results of test but they are not back as of this time  VITAL SIGNS: Temp:  [97.3 F (36.3 C)-98.2 F (36.8 C)] 97.5 F (36.4 C) (03/06 0500) Pulse Rate:  [69-85] 69 (03/06 0500) Resp:  [13-16] 13 (03/06 0500) BP: (93-103)/(64-67) 93/67 (03/06 0500) SpO2:  [95 %-97 %] 97 % (03/06 0500)  PHYSICAL EXAMINATION: General: Cachectic female in no acute distress.  Complains of left-sided pleural pain HEENT: No JVD lymphadenopathy is appreciated PSY: Normal effect Neuro: Appears intact CV: s1s2 rrr, no m/r/g PULM: Decreased breath sounds in the bases WV:PXTG, non-tender, bsx4 active  Extremities: warm/dry, negative edema  Skin: no rashes or lesions, multiple tattoos noted   CMP Latest Ref Rng & Units 01/03/2018 01/02/2018 08/05/2017  Glucose 65 - 99 mg/dL 80 120(H) 91  BUN 6 - 20 mg/dL <5(L) 6 5(L)  Creatinine 0.44 - 1.00 mg/dL 0.49 0.63 0.68  Sodium 135 - 145 mmol/L 141 138 142  Potassium 3.5 - 5.1 mmol/L 4.3 2.7(LL) 3.6  Chloride 101 - 111 mmol/L 108 102 111  CO2 22 - 32 mmol/L 23 24 23   Calcium 8.9 - 10.3 mg/dL 8.0(L) 8.1(L) 8.0(L)  Total Protein 6.5 - 8.1 g/dL 5.2(L) 5.4(L) 4.7(L)  Total Bilirubin 0.3 - 1.2 mg/dL 0.4 0.4 0.9  Alkaline Phos 38 - 126 U/L 130(H) 151(H) 120  AST 15 - 41 U/L 39 77(H) 19  ALT 14 - 54 U/L 41 61(H) 98(H)    CBC Latest Ref Rng & Units 01/02/2018 08/05/2017 08/04/2017  WBC 4.0 - 10.5 K/uL 11.6(H) 12.2(H) 12.2(H)  Hemoglobin 12.0 - 15.0 g/dL 12.1 10.8(L) 10.5(L)  Hematocrit 36.0 - 46.0 % 36.1 31.7(L) 30.7(L)  Platelets 150 - 400 K/uL 707(H) 147(L) 128(L)    STUDIES: CT chest  3/03 >> Lt > Rt cavitary infiltrates  ANTIBIOTICS: Zithromax 2/27 >> 3/03 Rocephin 2/27 >> 3/03  Meropenem 3/03 >> 3/04 Clindamycin 3/03 >> 3/04 Ancef 3/04 >>   CULTURES: Sputum 3/01 >> MSSA  ASSESSMENT / PLAN:  MSSA cavitary PNA with hx of polysubstance abuse. - continue Abx - f/u CXR, Echocardiogram - no indication for bronchoscopy at this time  [facility] Minor ACNP Maryanna Shape PCCM Pager [phone] till 1 pm If no answer page [phone] 01/03/2018, 11:15 AM  Denies fever, or hemoptysis.  Still has discomfort in Lt side.  BP 93/67 (BP Location: Right Arm)   Pulse 69   Temp (!) 97.5 F (36.4 C) (Oral)   Resp 13   SpO2 97%   Alert.  Decreased BS on Lt.  HR regular.  CMP Latest Ref Rng & Units 01/03/2018 01/02/2018 08/05/2017  Glucose 65 - 99 mg/dL 80 120(H) 91  BUN 6 - 20 mg/dL <5(L) 6 5(L)  Creatinine 0.44 - 1.00 mg/dL 0.49 0.63 0.68  Sodium 135 - 145 mmol/L 141 138 142  Potassium 3.5 - 5.1 mmol/L 4.3 2.7(LL) 3.6  Chloride 101 - 111 mmol/L 108 102 111  CO2 22 - 32 mmol/L 23 24 23   Calcium 8.9 - 10.3 mg/dL 8.0(L) 8.1(L) 8.0(L)  Total Protein 6.5 - 8.1 g/dL 5.2(L) 5.4(L) 4.7(L)  Total  Bilirubin 0.3 - 1.2 mg/dL 0.4 0.4 0.9  Alkaline Phos 38 - 126 U/L 130(H) 151(H) 120  AST 15 - 41 U/L 39 77(H) 19  ALT 14 - 54 U/L 41 61(H) 98(H)   CBC Latest Ref Rng & Units 01/02/2018 08/05/2017 08/04/2017  WBC 4.0 - 10.5 K/uL 11.6(H) 12.2(H) 12.2(H)  Hemoglobin 12.0 - 15.0 g/dL 12.1 10.8(L) 10.5(L)  Hematocrit 36.0 - 46.0 % 36.1 31.7(L) 30.7(L)  Platelets 150 - 400 K/uL 707(H) 147(L) 128(L)   Assessment/plan:  MSSA cavitary pneumonia. - she is clinically improving on current Abx - in spite of radiology report, my suspicion for TB is very low - I don't think she needs bronchoscopy at this time - can send sputum for AFB to further determine that she doesn't have TB - if she can't produce sputum because she is responding to Tx for MSSA pneumonia and she can't be released from airborne  isolation because radiology report said TB is in differential, then please call pulmonary back to arrange for bronchoscopy  PCCM will sign off otherwise.  Chesley Mires, MD [facility] Pulmonary/Critical Care 01/03/2018, 12:46 PM Pager:  [phone] After 3pm call: [phone]

## 2018-01-04 ENCOUNTER — Inpatient Hospital Stay (HOSPITAL_COMMUNITY): Payer: BLUE CROSS/BLUE SHIELD

## 2018-01-04 DIAGNOSIS — J984 Other disorders of lung: Secondary | ICD-10-CM

## 2018-01-04 DIAGNOSIS — E875 Hyperkalemia: Secondary | ICD-10-CM

## 2018-01-04 DIAGNOSIS — R7881 Bacteremia: Secondary | ICD-10-CM

## 2018-01-04 DIAGNOSIS — R911 Solitary pulmonary nodule: Secondary | ICD-10-CM

## 2018-01-04 DIAGNOSIS — J189 Pneumonia, unspecified organism: Secondary | ICD-10-CM

## 2018-01-04 DIAGNOSIS — A4901 Methicillin susceptible Staphylococcus aureus infection, unspecified site: Secondary | ICD-10-CM

## 2018-01-04 LAB — BASIC METABOLIC PANEL
Anion gap: 9 (ref 5–15)
Anion gap: 10 (ref 5–15)
BUN: 6 mg/dL (ref 6–20)
BUN: 7 mg/dL (ref 6–20)
CALCIUM: 8.5 mg/dL — AB (ref 8.9–10.3)
CALCIUM: 8.3 mg/dL — AB (ref 8.9–10.3)
CO2: 26 mmol/L (ref 22–32)
CO2: 25 mmol/L (ref 22–32)
CREATININE: 0.72 mg/dL (ref 0.44–1.00)
Chloride: 101 mmol/L (ref 101–111)
Chloride: 100 mmol/L — ABNORMAL LOW (ref 101–111)
Creatinine, Ser: 0.64 mg/dL (ref 0.44–1.00)
GFR calc Af Amer: >60 mL/min (ref >60)
GLUCOSE: 76 mg/dL (ref 65–99)
Glucose, Bld: 84 mg/dL (ref 65–99)
POTASSIUM: 6.1 mmol/L — AB (ref 3.5–5.1)
Potassium: 4.9 mmol/L (ref 3.5–5.1)
SODIUM: 135 mmol/L (ref 135–145)
Sodium: 136 mmol/L (ref 135–145)

## 2018-01-04 LAB — CBC
HEMATOCRIT: 37.6 % (ref 36.0–46.0)
HEMOGLOBIN: 11.9 g/dL — AB (ref 12.0–15.0)
MCH: 28.2 pg (ref 26.0–34.0)
MCHC: 31.6 g/dL (ref 30.0–36.0)
MCV: 89.1 fL (ref 78.0–100.0)
Platelets: 691 10*3/uL — ABNORMAL HIGH (ref 150–400)
RBC: 4.22 MIL/uL (ref 3.87–5.11)
RDW: 14.6 % (ref 11.5–15.5)
WBC: 10.3 10*3/uL (ref 4.0–10.5)

## 2018-01-04 LAB — EXPECTORATED SPUTUM ASSESSMENT W REFEX TO RESP CULTURE

## 2018-01-04 LAB — COMPREHENSIVE METABOLIC PANEL
ALBUMIN: 1.9 g/dL — AB (ref 3.5–5.0)
ALK PHOS: 125 U/L (ref 38–126)
ALT: 27 U/L (ref 14–54)
AST: 24 U/L (ref 15–41)
Anion gap: 8 (ref 5–15)
BILIRUBIN TOTAL: 0.4 mg/dL (ref 0.3–1.2)
BUN: 7 mg/dL (ref 6–20)
CALCIUM: 8.2 mg/dL — AB (ref 8.9–10.3)
CO2: 25 mmol/L (ref 22–32)
Chloride: 104 mmol/L (ref 101–111)
Creatinine, Ser: 0.64 mg/dL (ref 0.44–1.00)
GFR calc Af Amer: >60 mL/min (ref >60)
GFR calc non Af Amer: >60 mL/min (ref >60)
GLUCOSE: 91 mg/dL (ref 65–99)
Potassium: 5.9 mmol/L — ABNORMAL HIGH (ref 3.5–5.1)
Sodium: 137 mmol/L (ref 135–145)
TOTAL PROTEIN: 5.4 g/dL — AB (ref 6.5–8.1)

## 2018-01-04 LAB — EXPECTORATED SPUTUM ASSESSMENT W GRAM STAIN, RFLX TO RESP C

## 2018-01-04 LAB — ECHOCARDIOGRAM COMPLETE

## 2018-01-04 LAB — MAGNESIUM: MAGNESIUM: 2 mg/dL (ref 1.7–2.4)

## 2018-01-04 MED ORDER — SODIUM POLYSTYRENE SULFONATE PO POWD
15.0000 g | Freq: Once | ORAL | Status: AC
  Administered 2018-01-04: 15 g via ORAL
  Filled 2018-01-04: qty 15

## 2018-01-04 NOTE — Progress Notes (Signed)
  Echocardiogram 2D Echocardiogram has been performed.  Darlina Sicilian M 01/04/2018, 3:31 PM

## 2018-01-04 NOTE — Plan of Care (Signed)
  Progressing Health Behavior/Discharge Planning: Ability to manage health-related needs will improve 01/04/2018 1146 - Progressing by Rance Muir, RN Clinical Measurements: Ability to maintain clinical measurements within normal limits will improve 01/04/2018 1146 - Progressing by Rance Muir, RN Will remain free from infection 01/04/2018 1146 - Progressing by Rance Muir, RN Diagnostic test results will improve 01/04/2018 1146 - Progressing by Rance Muir, RN Respiratory complications will improve 01/04/2018 1146 - Progressing by Rance Muir, RN Cardiovascular complication will be avoided 01/04/2018 1146 - Progressing by Rance Muir, RN Activity: Risk for activity intolerance will decrease 01/04/2018 1146 - Progressing by Rance Muir, RN Nutrition: Adequate nutrition will be maintained 01/04/2018 1146 - Progressing by Rance Muir, RN Coping: Level of anxiety will decrease 01/04/2018 1146 - Progressing by Rance Muir, RN Elimination: Will not experience complications related to bowel motility 01/04/2018 1146 - Progressing by Rance Muir, RN Will not experience complications related to urinary retention 01/04/2018 1146 - Progressing by Rance Muir, RN Pain Managment: General experience of comfort will improve 01/04/2018 1146 - Progressing by Rance Muir, RN Safety: Ability to remain free from injury will improve 01/04/2018 1146 - Progressing by Rance Muir, RN Skin Integrity: Risk for impaired skin integrity will decrease 01/04/2018 1146 - Progressing by Rance Muir, RN Activity: Ability to tolerate increased activity will improve 01/04/2018 1146 - Progressing by Rance Muir, RN Clinical Measurements: Ability to maintain a body temperature in the normal range will improve 01/04/2018 1146 - Progressing by Rance Muir, RN Respiratory: Ability to maintain adequate ventilation will improve 01/04/2018 1146 - Progressing by Rance Muir, RN Ability to maintain a clear airway will improve 01/04/2018 1146 - Progressing by Rance Muir, RN

## 2018-01-04 NOTE — Progress Notes (Signed)
PROGRESS NOTE   Deanna Berry  JAS:[id number]    DOB: [birth year]    DOA: 01/02/2018  PCP: Redmond School, MD   I have briefly reviewed patients previous medical records in [facility].  Brief Narrative:  47 year old female with PMH of bipolar disorder, PUD/gastritis, hypothyroid, substance abuse was admitted to [facility] on 2/27 with left-sided multi lobar pneumonia, sepsis with MSSA, did not adequately improve and hence transferred to [facility] for further treatment. PCCM consulting.   Assessment & Plan:   Principal Problem:   Pneumonia of left lung due to methicillin susceptible Staphylococcus aureus (MSSA) ([place]) Active Problems:   Narcotic dependence ([place])   MSSA cavitary pneumonia: PCCM follow-up appreciated >improving on cefazolin, very low index of suspicion for TB, do not see need for bronchoscopy at this time, may send sputum for AFB if able to provide sample to further determined that she does not have TB but if she cannot be released from airborne isolation because radiology report said TB and differential, call CCM back for bronchoscopy.  HIV screen negative.  Blood cultures x2: Negative to date.  Sputum culture 3/7: Pending.  I advised RN to discuss with infection prevention department regarding discontinuing airborne isolation.  Bipolar disorder: Stable.  Continue home Strattera, Wellbutrin, Seroquel and trazodone.  Patient also on Tegretol and gabapentin?  Due to pain versus bipolar.  Outpatient follow-up.  Substance abuse/tobacco: Cessation counseled.  Patient denies abusing any other drugs.  Hypokalemia/hyperkalemia: Replaced.  On 3/7, potassium up to 5.9, verified with repeat BMP showing 6.1.  Likely due to potassium supplementation, discontinued.  Kayexalate 15 g x1 dose.  Follow BMP this evening.  Hypomagnesemia: Replaced  Severe protein calorie malnutrition: Dietitian consultation.  Narcotic dependence: Judicious use of opioids.     DVT prophylaxis:  Lovenox Code Status: Full Family Communication: Discussed with patient's boyfriend at bedside 3/7. Disposition: Home possibly 3/8 pending normal potassium, negative TTE and if able to come off air bone isolation.   Consultants:  PCCM-signed off 3/6  Procedures:  None  Antimicrobials:  IV cefazolin   Subjective: Feels better.  No chest pain reported.  No dyspnea.  Minimal dry cough.  ROS: Above.  Objective:  Vitals:   01/03/18 1355 01/03/18 2203 01/04/18 0256 01/04/18 0700  BP: 113/67 114/74  (!) 98/59  Pulse: 68 72  75  Resp: 15 16 16 16   Temp: 98.9 F (37.2 C) (!) 97.5 F (36.4 C)  (!) 97.5 F (36.4 C)  TempSrc: Oral Oral  Oral  SpO2: 98% 100%  95%    Examination:  General exam: Young female, small built, thinly nourished, lying comfortably propped up in bed. Respiratory system: Improved breath sounds.  Occasional crackles in left upper lung fields.  Rest of lung fields clear to auscultation. Cardiovascular system: S1 & S2 heard, RRR. No JVD, murmurs, rubs, gallops or clicks. No pedal edema. Gastrointestinal system: Abdomen is nondistended, soft and nontender. No organomegaly or masses felt. Normal bowel sounds heard. Central nervous system: Alert and oriented. No focal neurological deficits. Extremities: Symmetric 5 x 5 power. Skin: No rashes, lesions or ulcers Psychiatry: Judgement and insight appear normal. Mood & affect appropriate.     Data Reviewed: I have personally reviewed following labs and imaging studies  CBC: Recent Labs  Lab 01/02/18 0240 01/04/18 0541  WBC 11.6* 10.3  NEUTROABS 7.7  --   HGB 12.1 11.9*  HCT 36.1 37.6  MCV 86.0 89.1  PLT 707* 419*   Basic Metabolic Panel: Recent Labs  Lab 01/02/18 0240 01/03/18 0805 01/04/18 0541 01/04/18 0913  NA 138 141 137 136  K 2.7* 4.3 5.9* 6.1*  CL 102 108 104 101  CO2 24 23 25 26   GLUCOSE 120* 80 91 76  BUN 6 <5* 7 6  CREATININE 0.63 0.49 0.64 0.64  CALCIUM 8.1* 8.0* 8.2* 8.5*  MG   --  1.6* 2.0  --    Liver Function Tests: Recent Labs  Lab 01/02/18 0240 01/03/18 0805 01/04/18 0541  AST 77* 39 24  ALT 61* 41 27  ALKPHOS 151* 130* 125  BILITOT 0.4 0.4 0.4  PROT 5.4* 5.2* 5.4*  ALBUMIN 1.8* 1.8* 1.9*     Recent Results (from the past 240 hour(s))  Culture, blood (routine x 2) Call MD if unable to obtain prior to antibiotics being given     Status: None (Preliminary result)   Collection Time: 01/02/18  2:40 AM  Result Value Ref Range Status   Specimen Description BLOOD RIGHT ARM  Final   Special Requests IN PEDIATRIC BOTTLE Blood Culture adequate volume  Final   Culture   Final    NO GROWTH 2 DAYS Performed at [facility], [place] [address] [place], [place] [address]    Report Status PENDING  Incomplete  Culture, blood (routine x 2) Call MD if unable to obtain prior to antibiotics being given     Status: None (Preliminary result)   Collection Time: 01/02/18  2:47 AM  Result Value Ref Range Status   Specimen Description BLOOD RIGHT HAND  Final   Special Requests IN PEDIATRIC BOTTLE Blood Culture adequate volume  Final   Culture   Final    NO GROWTH 2 DAYS Performed at [facility], [place] [address], [place], [place] [address]    Report Status PENDING  Incomplete         Radiology Studies: No results found.      Scheduled Meds: . atomoxetine  80 mg Oral Daily  . buPROPion  150 mg Oral q morning - 10a  . carbamazepine  400 mg Oral BID  . enoxaparin (LOVENOX) injection  40 mg Subcutaneous Q24H  . gabapentin  600 mg Oral BID  . levothyroxine  50 mcg Oral QAC breakfast  . linaclotide  290 mcg Oral QAC breakfast  . pantoprazole  40 mg Oral Daily  . QUEtiapine  600 mg Oral QHS  . traZODone  100 mg Oral QHS   Continuous Infusions: .  ceFAZolin (ANCEF) IV 2 g (01/04/18 8315)     LOS: 2 days     Vernell Leep, MD, FACP, [facility]. [facility] Pager [phone]  If 7PM-7AM, please contact  night-coverage [email or web address] Password [email or web address] 01/04/2018, 1:29 PM

## 2018-01-04 NOTE — Consult Note (Signed)
Case discussed with Dr. Algis Liming.  Patient has cavitary lesions of the lung with 'abundant MSSA' in culture from record reviewed by Dr. Alcario Drought.  There is no particular risk factor for tuberculosis and a clear explanation for the cavitary findings so no indication for airborne isolation.   Thayer Headings, MD

## 2018-01-04 NOTE — Progress Notes (Signed)
Airborne precautions discontinued at this time per MD order.  EKG also obtained at this time

## 2018-01-05 MED ORDER — IBUPROFEN 200 MG PO TABS: 400.0000 mg | ORAL_TABLET | Freq: Four times a day (QID) | ORAL | Status: AC | PRN

## 2018-01-05 MED ORDER — PANTOPRAZOLE SODIUM 40 MG PO TBEC: 40.0000 mg | DELAYED_RELEASE_TABLET | Freq: Every day | ORAL | Status: AC | PRN

## 2018-01-05 MED ORDER — CEPHALEXIN 500 MG PO CAPS: 500.0000 mg | ORAL_CAPSULE | Freq: Four times a day (QID) | ORAL | 0 refills | Status: AC

## 2018-01-05 NOTE — Discharge Instructions (Signed)
Please get your medications reviewed and adjusted by your Primary MD. ° °Please request your Primary MD to go over all Hospital Tests and Procedure/Radiological results at the follow up, please get all Hospital records sent to your Prim MD by signing hospital release before you go home. ° °If you had Pneumonia of Lung problems at the Hospital: °Please get a 2 view Chest X ray done in 6-8 weeks after hospital discharge or sooner if instructed by your Primary MD. ° °If you have Congestive Heart Failure: °Please call your Cardiologist or Primary MD anytime you have any of the following symptoms:  °1) 3 pound weight gain in 24 hours or 5 pounds in 1 week  °2) shortness of breath, with or without a dry hacking cough  °3) swelling in the hands, feet or stomach  °4) if you have to sleep on extra pillows at night in order to breathe ° °Follow cardiac low salt diet and 1.5 lit/day fluid restriction. ° °If you have diabetes °Accuchecks 4 times/day, Once in AM empty stomach and then before each meal. °Log in all results and show them to your primary doctor at your next visit. °If any glucose reading is under 80 or above 300 call your primary MD immediately. ° °If you have Seizure/Convulsions/Epilepsy: °Please do not drive, operate heavy machinery, participate in activities at heights or participate in high speed sports until you have seen by Primary MD or a Neurologist and advised to do so again. ° °If you had Gastrointestinal Bleeding: °Please ask your Primary MD to check a complete blood count within one week of discharge or at your next visit. Your endoscopic/colonoscopic biopsies that are pending at the time of discharge, will also need to followed by your Primary MD. ° °Get Medicines reviewed and adjusted. °Please take all your medications with you for your next visit with your Primary MD ° °Please request your Primary MD to go over all hospital tests and procedure/radiological results at the follow up, please ask your  Primary MD to get all Hospital records sent to his/her office. ° °If you experience worsening of your admission symptoms, develop shortness of breath, life threatening emergency, suicidal or homicidal thoughts you must seek medical attention immediately by calling 911 or calling your MD immediately  if symptoms less severe. ° °You must read complete instructions/literature along with all the possible adverse reactions/side effects for all the Medicines you take and that have been prescribed to you. Take any new Medicines after you have completely understood and accpet all the possible adverse reactions/side effects.  ° °Do not drive or operate heavy machinery when taking Pain medications.  ° °Do not take more than prescribed Pain, Sleep and Anxiety Medications ° °Special Instructions: If you have smoked or chewed Tobacco  in the last 2 yrs please stop smoking, stop any regular Alcohol  and or any Recreational drug use. ° °Wear Seat belts while driving. ° °Please note °You were cared for by a hospitalist during your hospital stay. If you have any questions about your discharge medications or the care you received while you were in the hospital after you are discharged, you can call the unit and asked to speak with the hospitalist on call if the hospitalist that took care of you is not available. Once you are discharged, your primary care physician will handle any further medical issues. Please note that NO REFILLS for any discharge medications will be authorized once you are discharged, as it is imperative that you   return to your primary care physician (or establish a relationship with a primary care physician if you do not have one) for your aftercare needs so that they can reassess your need for medications and monitor your lab values.  You can reach the hospitalist office at phone [phone] or fax [phone]   If you do not have a primary care physician, you can call [phone] for a physician  referral.   Community-Acquired Pneumonia, Adult Pneumonia is an infection of the lungs. One type of pneumonia can happen while a person is in a hospital. A different type can happen when a person is not in a hospital (community-acquired pneumonia). It is easy for this kind to spread from person to person. It can spread to you if you breathe near an infected person who coughs or sneezes. Some symptoms include:  A dry cough.  A wet (productive) cough.  Fever.  Sweating.  Chest pain.  Follow these instructions at home:  Take over-the-counter and prescription medicines only as told by your doctor. ? Only take cough medicine if you are losing sleep. ? If you were prescribed an antibiotic medicine, take it as told by your doctor. Do not stop taking the antibiotic even if you start to feel better.  Sleep with your head and neck raised (elevated). You can do this by putting a few pillows under your head, or you can sleep in a recliner.  Do not use tobacco products. These include cigarettes, chewing tobacco, and e-cigarettes. If you need help quitting, ask your doctor.  Drink enough water to keep your pee (urine) clear or pale yellow. A shot (vaccine) can help prevent pneumonia. Shots are often suggested for:  People older than 47 years of age.  People older than 47 years of age: ? Who are having cancer treatment. ? Who have long-term (chronic) lung disease. ? Who have problems with their body's defense system (immune system).  You may also prevent pneumonia if you take these actions:  Get the flu (influenza) shot every year.  Go to the dentist as often as told.  Wash your hands often. If soap and water are not available, use hand sanitizer.  Contact a doctor if:  You have a fever.  You lose sleep because your cough medicine does not help. Get help right away if:  You are short of breath and it gets worse.  You have more chest pain.  Your sickness gets worse. This is  very serious if: ? You are an older adult. ? Your body's defense system is weak.  You cough up blood. This information is not intended to replace advice given to you by your health care provider. Make sure you discuss any questions you have with your health care provider. Document Released: 04/04/2008 Document Revised: 03/24/2016 Document Reviewed: 02/11/2015 Elsevier Interactive Patient Education  [employer].

## 2018-01-05 NOTE — Plan of Care (Signed)
  Progressing Health Behavior/Discharge Planning: Ability to manage health-related needs will improve 01/05/2018 1111 - Progressing by Rance Muir, RN Clinical Measurements: Ability to maintain clinical measurements within normal limits will improve 01/05/2018 1111 - Progressing by Rance Muir, RN Will remain free from infection 01/05/2018 1111 - Progressing by Rance Muir, RN Diagnostic test results will improve 01/05/2018 1111 - Progressing by Rance Muir, RN Respiratory complications will improve 01/05/2018 1111 - Progressing by Rance Muir, RN Cardiovascular complication will be avoided 01/05/2018 1111 - Progressing by Rance Muir, RN Activity: Risk for activity intolerance will decrease 01/05/2018 1111 - Progressing by Rance Muir, RN Nutrition: Adequate nutrition will be maintained 01/05/2018 1111 - Progressing by Rance Muir, RN Coping: Level of anxiety will decrease 01/05/2018 1111 - Progressing by Rance Muir, RN Elimination: Will not experience complications related to bowel motility 01/05/2018 1111 - Progressing by Rance Muir, RN Will not experience complications related to urinary retention 01/05/2018 1111 - Progressing by Rance Muir, RN Pain Managment: General experience of comfort will improve 01/05/2018 1111 - Progressing by Rance Muir, RN Safety: Ability to remain free from injury will improve 01/05/2018 1111 - Progressing by Rance Muir, RN Skin Integrity: Risk for impaired skin integrity will decrease 01/05/2018 1111 - Progressing by Rance Muir, RN Activity: Ability to tolerate increased activity will improve 01/05/2018 1111 - Progressing by Rance Muir, RN Clinical Measurements: Ability to maintain a body temperature in the normal range will improve 01/05/2018 1111 - Progressing by Rance Muir, RN Respiratory: Ability to maintain adequate ventilation will improve 01/05/2018 1111 - Progressing by Rance Muir, RN Ability to maintain a clear airway will improve 01/05/2018 1111 - Progressing by Rance Muir, RN

## 2018-01-05 NOTE — Discharge Summary (Signed)
Physician Discharge Summary  Deanna Berry CWC:[id number] DOB: August 03, 1971  PCP: Redmond School, MD  Admit date: 01/02/2018 Discharge date: 01/05/2018  Recommendations for Outpatient Follow-up:  1. [name], PCP in 1 week with repeat labs (CBC & BMP). 2. Recommend follow-up chest x-ray in 4 weeks to ensure resolution of pneumonia findings.  Home Health: None Equipment/Devices: None  Discharge Condition: Improved and stable. CODE STATUS: Full Diet recommendation: Heart healthy diet.  Discharge Diagnoses:  Principal Problem:   Pneumonia of left lung due to methicillin susceptible Staphylococcus aureus (MSSA) ([place]) Active Problems:   Narcotic dependence [facility])   Brief Summary: 47 year old female with PMH of bipolar disorder, PUD/gastritis, hypothyroid, substance abuse was admitted to [facility] on 2/27 with left-sided multi lobar pneumonia, sepsis with MSSA, did not adequately improve and hence transferred to [facility] for further treatment. PCCM consulted   Assessment & Plan:   MSSA cavitary pneumonia: HIV screen negative.  Blood cultures x2: Negative to date.  Sputum culture 3/7: Pending.  However blood cultures x2 from OSH were negative, final report.   Pulmonology was consulted.  Low index of suspicion for TB and hence empirically started airborne isolation was discontinued after discussion with ID.  Empirically started on IV Ancef and patient has clinically improved.  Discussed in detail with Infectious Disease MD on call today who recommends 1 month course of Keflex (patient reports remote allergy/skin rash to amoxicillin but cannot provide details.  She has tolerated Ceftriaxone at OSH and Ancef here), follow-up chest x-ray in 4 weeks to ensure resolution of pneumonia findings.  If at that time she has persisting pneumonia findings, may need longer course of antibiotics and may consider outpatient ID consultation and follow-up.  PCP is welcome to contact ID physicians at [facility] for further assistance.  I personally reviewed CT chest, chest x-ray reports from OSH 12/31/17 and sputum culture sensitivity results in the hard chart and have attached copies in this note below.  TTE unremarkable and no comment regarding vegetations.  Would also consider follow-up CT chest due to history of smoking and nodular densities reported on CT which could be infectious but need to ensure not something else such as malignancy.  Bipolar disorder: Continue home Strattera, Wellbutrin, Seroquel and trazodone.  Patient also on Tegretol and gabapentin.    Patient confirms that she is taking all these medications and follows with Dr. [name] at [facility].  Stable.  Substance abuse/tobacco: Cessation counseled.  Patient denies abusing any other drugs.  She specifically denies using IV drugs or any other substances other than smoking cigarettes.  Hypokalemia/hyperkalemia: Replaced.  On 3/7, potassium up to 5.9, verified with repeat BMP showing 6.1.  Likely due to potassium supplementation, discontinued.  Kayexalate 15 g x1 dose.  Potassium has normalized.  EKG without acute changes.  Hypomagnesemia: Replaced  Severe protein calorie malnutrition: Dietitian consultation can be done as outpatient.  Narcotic dependence: Not sure about this because patient specifically denies using any substances other than cigarettes and is not on opioids at home.  Normocytic anemia: Suspect chronic and may be related to chronic disease.  Outpatient follow-up and evaluation as deemed necessary.  Thrombocytosis: May be reactive to acute illness.  Follow CBC in a week's time as outpatient.      Consultants:  PCCM-signed off 3/6  Procedures:  TTE 01/04/18:  Study Conclusions  - Left ventricle: The cavity size was normal. Wall thickness was   normal. Systolic function was normal. The estimated  ejection   fraction was in the range of 60% to 65%. Wall  motion was normal;   there were no regional wall motion abnormalities. Left   ventricular diastolic function parameters were normal. - Pericardium, extracardiac: A trivial pericardial effusion was   identified.  Impressions:  - Normal LV systolic and diastolic function; trace MR and TR.    Discharge Instructions  Discharge Instructions    Call MD for:  difficulty breathing, headache or visual disturbances   Complete by:  As directed    Call MD for:  extreme fatigue   Complete by:  As directed    Call MD for:  persistant dizziness or light-headedness   Complete by:  As directed    Call MD for:  severe uncontrolled pain   Complete by:  As directed    Call MD for:  temperature >100.4   Complete by:  As directed    Diet - low sodium heart healthy   Complete by:  As directed    Increase activity slowly   Complete by:  As directed        Medication List    TAKE these medications   atomoxetine 80 MG capsule Commonly known as:  STRATTERA Take 80 mg by mouth daily.   buPROPion 150 MG 24 hr tablet Commonly known as:  WELLBUTRIN XL Take 150 mg by mouth every morning.   carbamazepine 400 MG 12 hr tablet Commonly known as:  TEGRETOL XR Take 400 mg by mouth 2 (two) times daily.   cephALEXin 500 MG capsule Commonly known as:  KEFLEX Take 1 capsule (500 mg total) by mouth 4 (four) times daily.   gabapentin 600 MG tablet Commonly known as:  NEURONTIN Take 600 mg by mouth 3 (three) times daily.   ibuprofen 200 MG tablet Commonly known as:  ADVIL,MOTRIN Take 2 tablets (400 mg total) by mouth every 6 (six) hours as needed for fever, mild pain or moderate pain (pain).   levothyroxine 50 MCG tablet Commonly known as:  SYNTHROID, LEVOTHROID Take 1 tablet by mouth daily before breakfast.   linaclotide 290 MCG Caps capsule Commonly known as:  LINZESS Take 1 capsule (290 mcg total) by mouth daily before breakfast.   pantoprazole 40 MG tablet Commonly known as:   PROTONIX Take 1 tablet (40 mg total) by mouth daily as needed (acid reflux).   QUEtiapine 300 MG tablet Commonly known as:  SEROQUEL Take 600 mg by mouth at bedtime.   traZODone 100 MG tablet Commonly known as:  DESYREL Take 100 mg by mouth at bedtime.      Follow-up Information    Redmond School, MD. Schedule an appointment as soon as possible for a visit in 1 week(s).   Specialty:  Internal Medicine Why:  To be seen with repeat labs (CBC & BMP).  Will need repeat chest x-ray in 4 weeks to ensure resolution of pneumonia findings. Contact information: [address] [place] [address] [phone]          Allergies  Allergen Reactions  . Amoxil [Amoxicillin] Hives    Has patient had a PCN reaction causing immediate rash, facial/tongue/throat swelling, SOB or lightheadedness with hypotension: No Has patient had a PCN reaction causing severe rash involving mucus membranes or skin necrosis: No Has patient had a PCN reaction that required hospitalization: No Has patient had a PCN reaction occurring within the last 10 years: No If all of the above answers are "NO", then may proceed with Cephalosporin use.   Marland Kitchen  Adhesive [Tape] Rash  . Neosporin Original [Bacitracin-Neomycin-Polymyxin] Rash      Subjective: Overall feels much better compared to initial admission here.  Minimal dry cough.  No fever or chills.  No dyspnea.  Significantly improved left upper chest pain.  Denies any other complaints.  No suicidal or homicidal ideations or audiovisual hallucinations reported.  Indicates that apart from smoking cigarettes, she does not abuse any other substances.  No dizziness or lightheadedness.  Discharge Exam:  Vitals:   01/04/18 2204 01/05/18 0640 01/05/18 0649 01/05/18 1320  BP: 112/81 (!) 78/51 (!) 77/52 (!) 91/57  Pulse: 78 81 80   Resp: 16 15 16    Temp: 97.7 F (36.5 C) 97.6 F (36.4 C) 97.9 F (36.6 C)   TempSrc: Oral Oral Oral   SpO2: 99% 98% 95%      General exam: Young female, small built, thinly nourished, sitting up comfortably in bed. Respiratory system:  Clear to auscultation.  No increased work of breathing.  This is much improved compared to 2 days ago. Cardiovascular system: S1 & S2 heard, RRR. No JVD, murmurs, rubs, gallops or clicks. No pedal edema. Gastrointestinal system: Abdomen is nondistended, soft and nontender. No organomegaly or masses felt. Normal bowel sounds heard. Central nervous system: Alert and oriented. No focal neurological deficits. Extremities: Symmetric 5 x 5 power. Skin: No rashes, lesions or ulcers Psychiatry: Judgement and insight appear normal. Mood & affect appropriate.       The results of significant diagnostics from this hospitalization (including imaging, microbiology, ancillary and laboratory) are listed below for reference.     Microbiology: Recent Results (from the past 240 hour(s))  Culture, blood (routine x 2) Call MD if unable to obtain prior to antibiotics being given     Status: None (Preliminary result)   Collection Time: 01/02/18  2:40 AM  Result Value Ref Range Status   Specimen Description BLOOD RIGHT ARM  Final   Special Requests IN PEDIATRIC BOTTLE Blood Culture adequate volume  Final   Culture   Final    NO GROWTH 2 DAYS Performed at [facility], [address] [place], [place] [address]    Report Status PENDING  Incomplete  Culture, blood (routine x 2) Call MD if unable to obtain prior to antibiotics being given     Status: None (Preliminary result)   Collection Time: 01/02/18  2:47 AM  Result Value Ref Range Status   Specimen Description BLOOD RIGHT HAND  Final   Special Requests IN PEDIATRIC BOTTLE Blood Culture adequate volume  Final   Culture   Final    NO GROWTH 2 DAYS Performed at [facility], [place] [address], [place], [place] [address]    Report Status PENDING  Incomplete  Culture, sputum-assessment     Status: None   Collection Time: 01/04/18   1:26 AM  Result Value Ref Range Status   Specimen Description EXPECTORATED SPUTUM  Final   Special Requests NONE  Final   Sputum evaluation   Final    THIS SPECIMEN IS ACCEPTABLE FOR SPUTUM CULTURE Performed at [facility], [address], [place], [place] [address]    Report Status 01/04/2018 FINAL  Final  Culture, respiratory (NON-Expectorated)     Status: None (Preliminary result)   Collection Time: 01/04/18  1:26 AM  Result Value Ref Range Status   Specimen Description EXPECTORATED SPUTUM  Final   Special Requests NONE Reflexed from T1177  Final   Gram Stain  Final    RARE WBC PRESENT, PREDOMINANTLY PMN RARE GRAM POSITIVE COCCI    Culture   Final    CULTURE REINCUBATED FOR BETTER GROWTH Performed at [facility], [place] [address] [place], [place] [address]    Report Status PENDING  Incomplete     Labs: CBC: Recent Labs  Lab 01/02/18 0240 01/04/18 0541  WBC 11.6* 10.3  NEUTROABS 7.7  --   HGB 12.1 11.9*  HCT 36.1 37.6  MCV 86.0 89.1  PLT 707* 115*   Basic Metabolic Panel: Recent Labs  Lab 01/02/18 0240 01/03/18 0805 01/04/18 0541 01/04/18 0913 01/04/18 1541  NA 138 141 137 136 135  K 2.7* 4.3 5.9* 6.1* 4.9  CL 102 108 104 101 100*  CO2 24 23 25 26 25   GLUCOSE 120* 80 91 76 84  BUN 6 <5* 7 6 7   CREATININE 0.63 0.49 0.64 0.64 0.72  CALCIUM 8.1* 8.0* 8.2* 8.5* 8.3*  MG  --  1.6* 2.0  --   --    Liver Function Tests: Recent Labs  Lab 01/02/18 0240 01/03/18 0805 01/04/18 0541  AST 77* 39 24  ALT 61* 41 27  ALKPHOS 151* 130* 125  BILITOT 0.4 0.4 0.4  PROT 5.4* 5.2* 5.4*  ALBUMIN 1.8* 1.8* 1.9*           Discussed in detail with patient's boyfriend at bedside.  Updated care and answered questions.  Time coordinating discharge: Over 30 minutes  SIGNED:  Vernell Leep, MD, FACP, [facility]. [facility] Pager [phone]  If 7PM-7AM, please contact night-coverage [email or web address] Password [email or web address] 01/05/2018, 1:23 PM

## 2018-01-07 LAB — CULTURE, RESPIRATORY

## 2018-01-07 LAB — CULTURE, BLOOD (ROUTINE X 2)
CULTURE: NO GROWTH
CULTURE: NO GROWTH
SPECIAL REQUESTS: ADEQUATE
Special Requests: ADEQUATE

## 2018-01-07 LAB — CULTURE, RESPIRATORY W GRAM STAIN

## 2018-01-15 ENCOUNTER — Other Ambulatory Visit: Payer: Self-pay

## 2018-01-15 DIAGNOSIS — D582 Other hemoglobinopathies: Secondary | ICD-10-CM

## 2018-02-08 DIAGNOSIS — F319 Bipolar disorder, unspecified: Secondary | ICD-10-CM | POA: Diagnosis not present

## 2018-02-08 DIAGNOSIS — S61511A Laceration without foreign body of right wrist, initial encounter: Secondary | ICD-10-CM | POA: Diagnosis not present

## 2018-02-08 DIAGNOSIS — W25XXXA Contact with sharp glass, initial encounter: Secondary | ICD-10-CM | POA: Diagnosis not present

## 2018-02-08 DIAGNOSIS — Z79899 Other long term (current) drug therapy: Secondary | ICD-10-CM | POA: Diagnosis not present

## 2018-02-08 DIAGNOSIS — F17200 Nicotine dependence, unspecified, uncomplicated: Secondary | ICD-10-CM | POA: Diagnosis not present

## 2018-03-23 DIAGNOSIS — F17200 Nicotine dependence, unspecified, uncomplicated: Secondary | ICD-10-CM | POA: Diagnosis not present

## 2018-03-23 DIAGNOSIS — F329 Major depressive disorder, single episode, unspecified: Secondary | ICD-10-CM | POA: Diagnosis not present

## 2018-03-23 DIAGNOSIS — Z79899 Other long term (current) drug therapy: Secondary | ICD-10-CM | POA: Diagnosis not present

## 2018-03-27 DIAGNOSIS — G92 Toxic encephalopathy: Secondary | ICD-10-CM | POA: Diagnosis not present

## 2018-03-27 DIAGNOSIS — T43625A Adverse effect of amphetamines, initial encounter: Secondary | ICD-10-CM | POA: Diagnosis not present

## 2018-03-27 DIAGNOSIS — Z8759 Personal history of other complications of pregnancy, childbirth and the puerperium: Secondary | ICD-10-CM | POA: Diagnosis not present

## 2018-03-27 DIAGNOSIS — F1210 Cannabis abuse, uncomplicated: Secondary | ICD-10-CM | POA: Diagnosis not present

## 2018-03-27 DIAGNOSIS — M6282 Rhabdomyolysis: Secondary | ICD-10-CM | POA: Diagnosis not present

## 2018-03-27 DIAGNOSIS — R4182 Altered mental status, unspecified: Secondary | ICD-10-CM | POA: Diagnosis not present

## 2018-03-27 DIAGNOSIS — R402441 Other coma, without documented Glasgow coma scale score, or with partial score reported, in the field [EMT or ambulance]: Secondary | ICD-10-CM | POA: Diagnosis not present

## 2018-03-27 DIAGNOSIS — Z7289 Other problems related to lifestyle: Secondary | ICD-10-CM | POA: Diagnosis not present

## 2018-03-27 DIAGNOSIS — I10 Essential (primary) hypertension: Secondary | ICD-10-CM | POA: Diagnosis not present

## 2018-03-27 DIAGNOSIS — Z3202 Encounter for pregnancy test, result negative: Secondary | ICD-10-CM | POA: Diagnosis not present

## 2018-03-27 DIAGNOSIS — F909 Attention-deficit hyperactivity disorder, unspecified type: Secondary | ICD-10-CM | POA: Diagnosis not present

## 2018-03-27 DIAGNOSIS — F1910 Other psychoactive substance abuse, uncomplicated: Secondary | ICD-10-CM | POA: Diagnosis not present

## 2018-03-27 DIAGNOSIS — E039 Hypothyroidism, unspecified: Secondary | ICD-10-CM | POA: Diagnosis not present

## 2018-03-27 DIAGNOSIS — F319 Bipolar disorder, unspecified: Secondary | ICD-10-CM | POA: Diagnosis not present

## 2018-03-27 DIAGNOSIS — Z79899 Other long term (current) drug therapy: Secondary | ICD-10-CM | POA: Diagnosis not present

## 2018-03-30 IMAGING — CT CT HEAD W/O CM
4 series · 16 of 47 positions shown, 18 images · non-contrast
Comparison: Head CT 03/24/2015.

CLINICAL DATA: Altered mental status. Speech change and imbalance.
2 falls. Laceration about the orbits. History of cleft palate.
Initial encounter.

EXAM:
CT HEAD AND ORBITS WITHOUT CONTRAST
TECHNIQUE: Contiguous axial images were obtained from the base of the skull
through the vertex without contrast. Multidetector CT imaging of the
orbits was performed using the standard protocol without intravenous
contrast.

[Series 3: head without · axial · non-contrast · 0.41mm/px · z∈[+428,+548]mm · 7 of 34 slices shown, 9 images]
[im 5/34  brain]
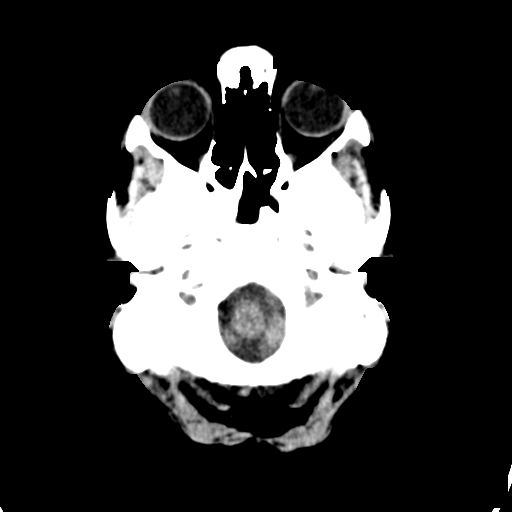
[im 5/34  bone]
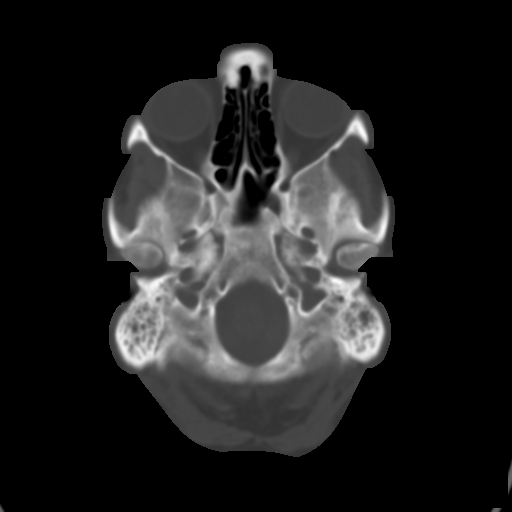
[im 9/34  brain]
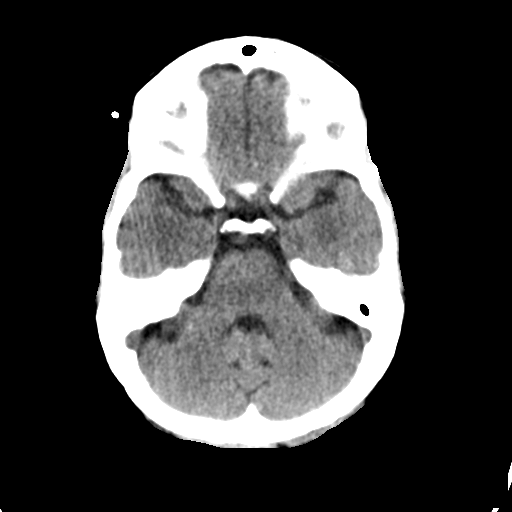
[im 13/34  brain]
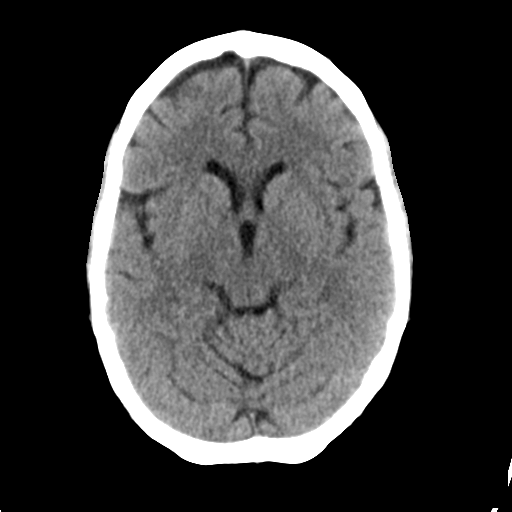
[im 17/34  brain]
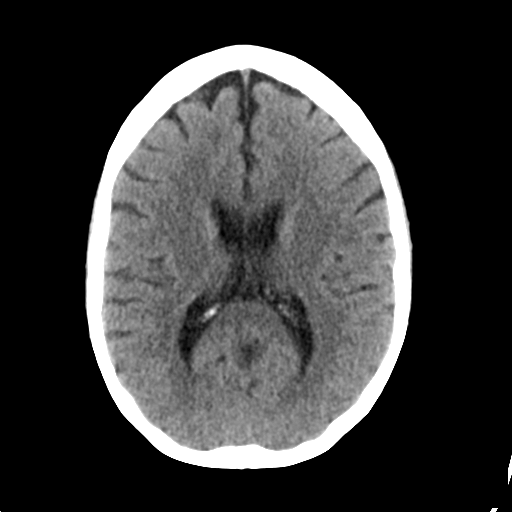
[im 21/34  brain]
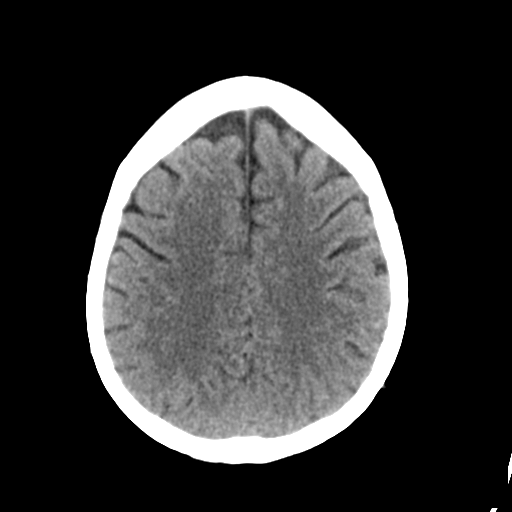
[im 21/34  bone]
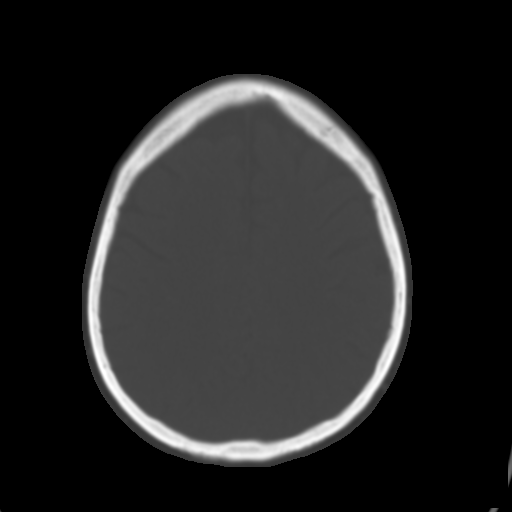
[im 25/34  brain]
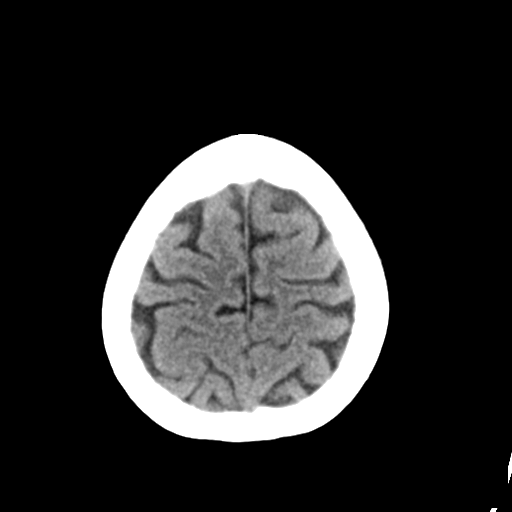
[im 29/34  brain]
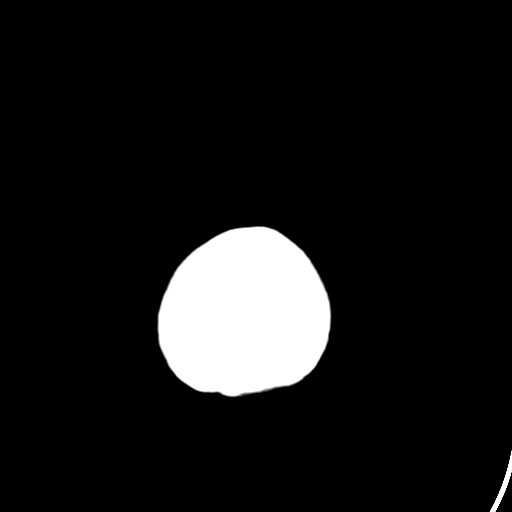

[Series 4: head bone · axial · 0.41mm/px · z∈[+424,+456]mm · 3 of 84 slices shown]
[im 9/84  bone]
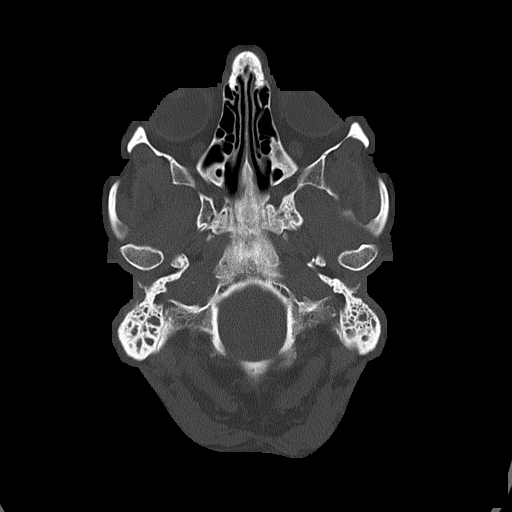
[im 17/84  bone]
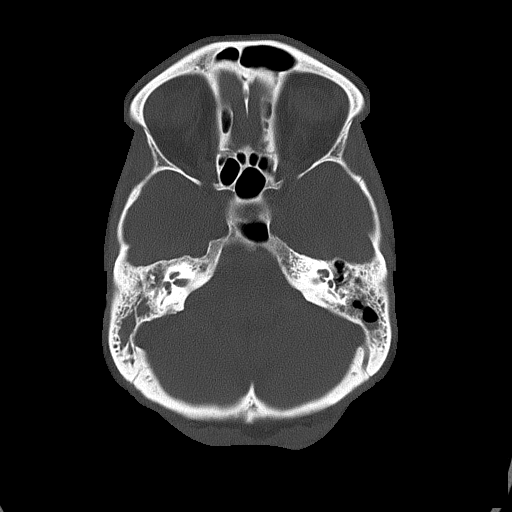
[im 25/84  bone]
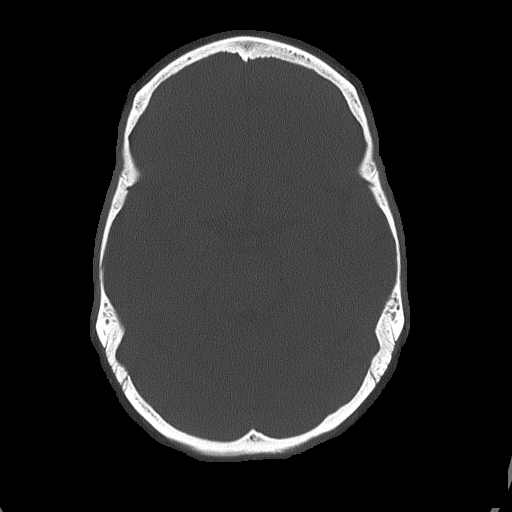

[Series 5: head without cor · coronal · non-contrast · 0.30mm/px · 3 of 63 slices shown]
[im 21/63  brain]
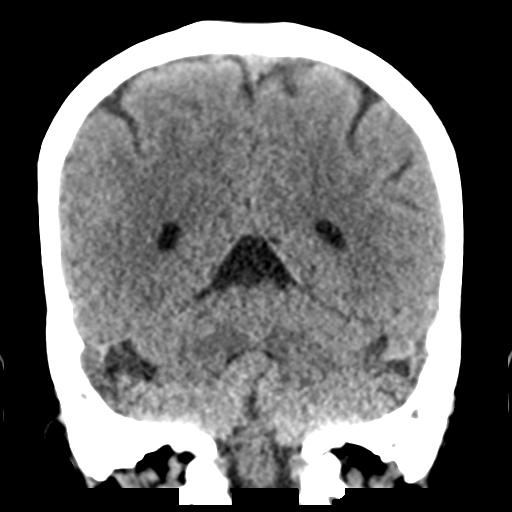
[im 28/63  brain]
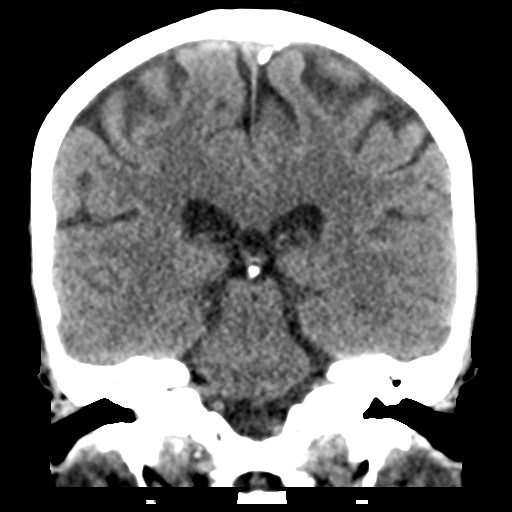
[im 35/63  brain]
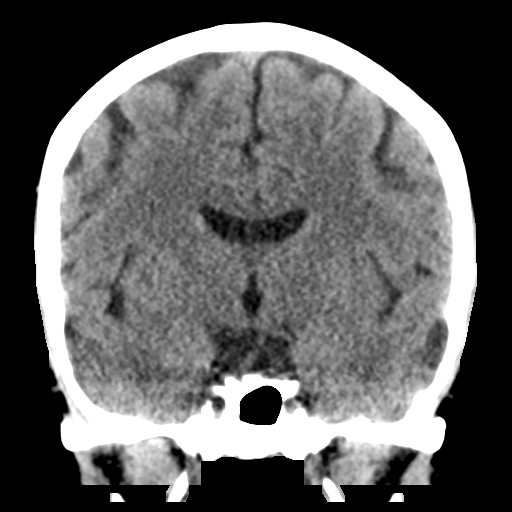

[Series 6: head without sag · sagittal · non-contrast · 0.30mm/px · 3 of 50 slices shown]
[im 17/50  brain]
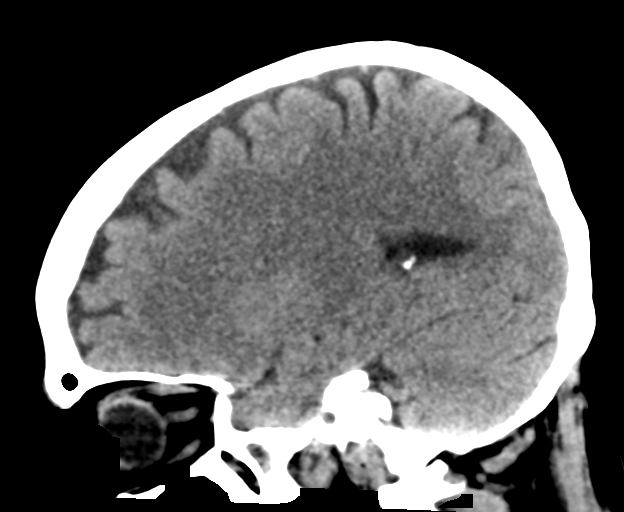
[im 25/50  brain]
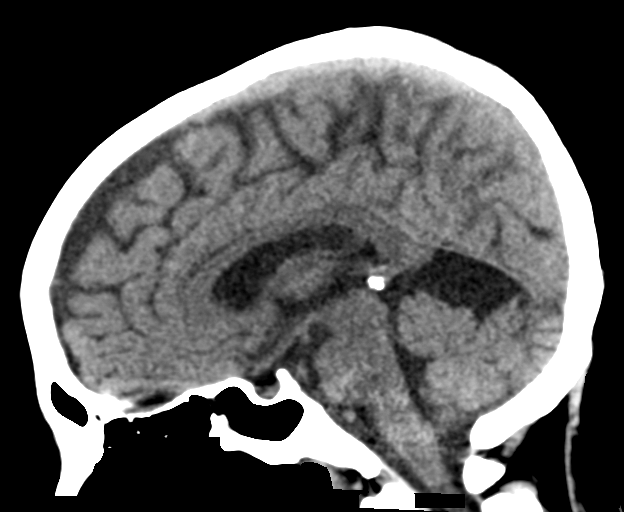
[im 33/50  brain]
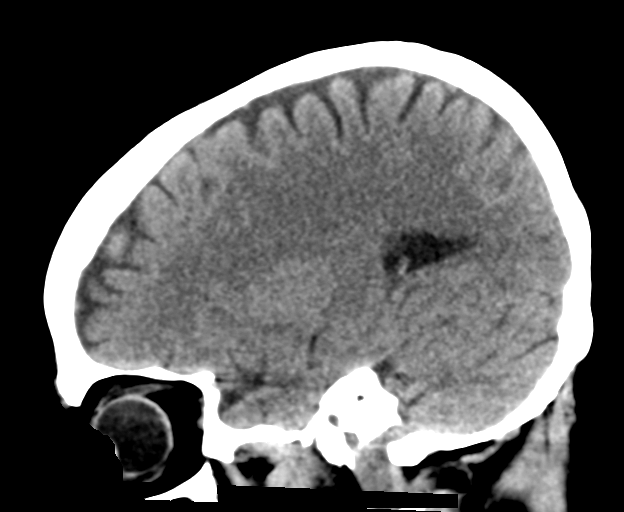

[16 of 47 positions shown; findings below may reference images not displayed]

FINDINGS: CT HEAD FINDINGS

Brain: There is no evidence of acute infarct, intracranial
hemorrhage, mass, midline shift, or extra-axial fluid collection.
The ventricles and sulci are normal. Prominent extra-axial CSF space
at the level of the suprasellar basilar cistern measures 3.5 x
cm and may represent an incidental arachnoid cyst, unchanged.

Vascular: Minimal bilateral carotid siphon calcification. No
hyperdense vessel.

Skull: No fracture or focal osseous lesion.

Other: None.

CT ORBITS FINDINGS

Orbits: The globes appear intact. The orbital fat is normal in
appearance without evidence of hematoma or mass. The extraocular
muscles and optic nerve complexes are symmetric and normal in
appearance. No acute fracture is identified.

Visualized sinuses: Postsurgical changes in the paranasal sinuses
and nasal cavity. Left greater than right maxillary sinus osteitis
consistent with chronic sinusitis. Mild mucosal thickening and
bubbly fluid in the right maxillary sinus. Large bilateral mastoid
effusions. Right middle ear opacification.

Soft tissues: Mild right periorbital soft tissue swelling.
IMPRESSION: 1. No evidence of acute intracranial abnormality.
2. Mild right periorbital soft tissue swelling without evidence of
acute orbital fracture.
3. Chronic sinusitis with possible superimposed acute right
maxillary sinusitis.
4. Large mastoid effusions with right middle ear opacification.

## 2018-04-18 DIAGNOSIS — F29 Unspecified psychosis not due to a substance or known physiological condition: Secondary | ICD-10-CM | POA: Diagnosis not present

## 2018-04-18 DIAGNOSIS — F99 Mental disorder, not otherwise specified: Secondary | ICD-10-CM | POA: Diagnosis not present

## 2018-04-18 DIAGNOSIS — I4581 Long QT syndrome: Secondary | ICD-10-CM | POA: Diagnosis not present

## 2018-04-18 DIAGNOSIS — Z046 Encounter for general psychiatric examination, requested by authority: Secondary | ICD-10-CM | POA: Diagnosis not present

## 2018-04-18 DIAGNOSIS — Z87898 Personal history of other specified conditions: Secondary | ICD-10-CM | POA: Diagnosis not present

## 2018-04-19 DIAGNOSIS — T407X1A Poisoning by cannabis (derivatives), accidental (unintentional), initial encounter: Secondary | ICD-10-CM | POA: Diagnosis not present

## 2018-04-19 DIAGNOSIS — G92 Toxic encephalopathy: Secondary | ICD-10-CM | POA: Diagnosis not present

## 2018-04-19 DIAGNOSIS — R4182 Altered mental status, unspecified: Secondary | ICD-10-CM | POA: Diagnosis not present

## 2018-04-19 DIAGNOSIS — Z87898 Personal history of other specified conditions: Secondary | ICD-10-CM | POA: Diagnosis not present

## 2018-04-19 DIAGNOSIS — F1910 Other psychoactive substance abuse, uncomplicated: Secondary | ICD-10-CM | POA: Diagnosis not present

## 2018-04-19 DIAGNOSIS — Z781 Physical restraint status: Secondary | ICD-10-CM | POA: Diagnosis not present

## 2018-04-19 DIAGNOSIS — H539 Unspecified visual disturbance: Secondary | ICD-10-CM | POA: Diagnosis not present

## 2018-04-19 DIAGNOSIS — R404 Transient alteration of awareness: Secondary | ICD-10-CM | POA: Diagnosis not present

## 2018-04-19 DIAGNOSIS — F1210 Cannabis abuse, uncomplicated: Secondary | ICD-10-CM | POA: Diagnosis not present

## 2018-04-19 DIAGNOSIS — F1510 Other stimulant abuse, uncomplicated: Secondary | ICD-10-CM | POA: Diagnosis not present

## 2018-04-19 DIAGNOSIS — R402252 Coma scale, best verbal response, oriented, at arrival to emergency department: Secondary | ICD-10-CM | POA: Diagnosis not present

## 2018-04-19 DIAGNOSIS — R443 Hallucinations, unspecified: Secondary | ICD-10-CM | POA: Diagnosis not present

## 2018-04-19 DIAGNOSIS — T43691A Poisoning by other psychostimulants, accidental (unintentional), initial encounter: Secondary | ICD-10-CM | POA: Diagnosis not present

## 2018-04-19 DIAGNOSIS — Z046 Encounter for general psychiatric examination, requested by authority: Secondary | ICD-10-CM | POA: Diagnosis not present

## 2018-04-19 DIAGNOSIS — R402142 Coma scale, eyes open, spontaneous, at arrival to emergency department: Secondary | ICD-10-CM | POA: Diagnosis not present

## 2018-04-19 DIAGNOSIS — G9340 Encephalopathy, unspecified: Secondary | ICD-10-CM | POA: Diagnosis not present

## 2018-04-19 DIAGNOSIS — R402362 Coma scale, best motor response, obeys commands, at arrival to emergency department: Secondary | ICD-10-CM | POA: Diagnosis not present

## 2018-04-19 DIAGNOSIS — T43621A Poisoning by amphetamines, accidental (unintentional), initial encounter: Secondary | ICD-10-CM | POA: Diagnosis not present

## 2018-04-19 DIAGNOSIS — F29 Unspecified psychosis not due to a substance or known physiological condition: Secondary | ICD-10-CM | POA: Diagnosis not present

## 2018-04-20 DIAGNOSIS — G9340 Encephalopathy, unspecified: Secondary | ICD-10-CM | POA: Diagnosis not present

## 2018-04-21 DIAGNOSIS — F1910 Other psychoactive substance abuse, uncomplicated: Secondary | ICD-10-CM | POA: Diagnosis not present

## 2018-04-21 DIAGNOSIS — R404 Transient alteration of awareness: Secondary | ICD-10-CM | POA: Diagnosis not present

## 2018-04-22 DIAGNOSIS — R404 Transient alteration of awareness: Secondary | ICD-10-CM | POA: Diagnosis not present

## 2018-04-22 DIAGNOSIS — F1910 Other psychoactive substance abuse, uncomplicated: Secondary | ICD-10-CM | POA: Diagnosis not present

## 2018-04-23 DIAGNOSIS — F1910 Other psychoactive substance abuse, uncomplicated: Secondary | ICD-10-CM | POA: Diagnosis not present

## 2018-04-23 DIAGNOSIS — R404 Transient alteration of awareness: Secondary | ICD-10-CM | POA: Diagnosis not present

## 2018-04-29 DIAGNOSIS — R4020 Unspecified coma: Secondary | ICD-10-CM | POA: Diagnosis not present

## 2018-04-29 DIAGNOSIS — I469 Cardiac arrest, cause unspecified: Secondary | ICD-10-CM | POA: Diagnosis not present

## 2018-04-29 DIAGNOSIS — T1491XA Suicide attempt, initial encounter: Secondary | ICD-10-CM | POA: Diagnosis not present

## 2018-04-29 DIAGNOSIS — F29 Unspecified psychosis not due to a substance or known physiological condition: Secondary | ICD-10-CM | POA: Diagnosis not present

## 2018-04-30 DIAGNOSIS — I469 Cardiac arrest, cause unspecified: Secondary | ICD-10-CM | POA: Diagnosis not present

## 2018-04-30 DIAGNOSIS — R4020 Unspecified coma: Secondary | ICD-10-CM | POA: Diagnosis not present

## 2018-04-30 DIAGNOSIS — F29 Unspecified psychosis not due to a substance or known physiological condition: Secondary | ICD-10-CM | POA: Diagnosis not present

## 2018-04-30 DIAGNOSIS — T1491XA Suicide attempt, initial encounter: Secondary | ICD-10-CM | POA: Diagnosis not present

## 2018-05-01 DIAGNOSIS — T1491XA Suicide attempt, initial encounter: Secondary | ICD-10-CM | POA: Diagnosis not present

## 2018-05-01 DIAGNOSIS — I469 Cardiac arrest, cause unspecified: Secondary | ICD-10-CM | POA: Diagnosis not present

## 2018-05-01 DIAGNOSIS — F29 Unspecified psychosis not due to a substance or known physiological condition: Secondary | ICD-10-CM | POA: Diagnosis not present

## 2018-05-01 DIAGNOSIS — R4020 Unspecified coma: Secondary | ICD-10-CM | POA: Diagnosis not present

## 2018-05-31 DEATH — deceased

## 2018-12-03 IMAGING — US US ABDOMEN COMPLETE
1 series · 14 of 25 positions shown · non-contrast
Comparison: CT abdomen and pelvis 02/23/2017

CLINICAL DATA: Smoker, acute kidney injury, elevated total
bilirubin, history substance abuse

EXAM:
ABDOMEN ULTRASOUND COMPLETE

[Series 1: us abdomen complete · 0.23mm/px · 14 of 81 slices shown]
[im 1/81]
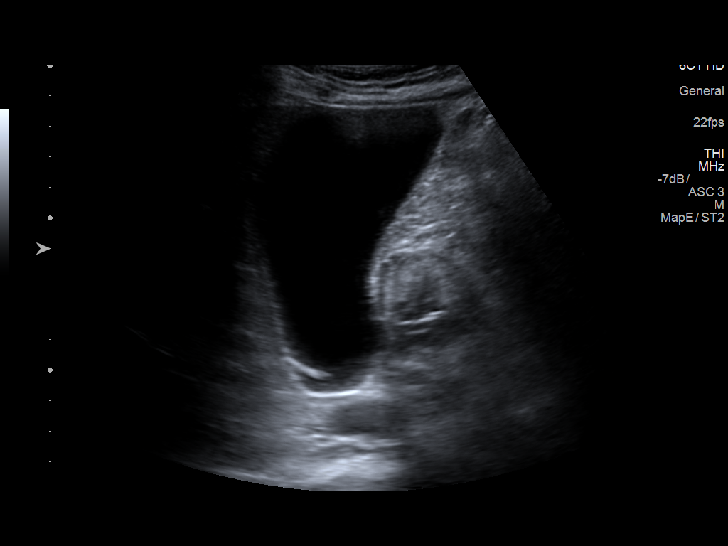
[im 7/81]
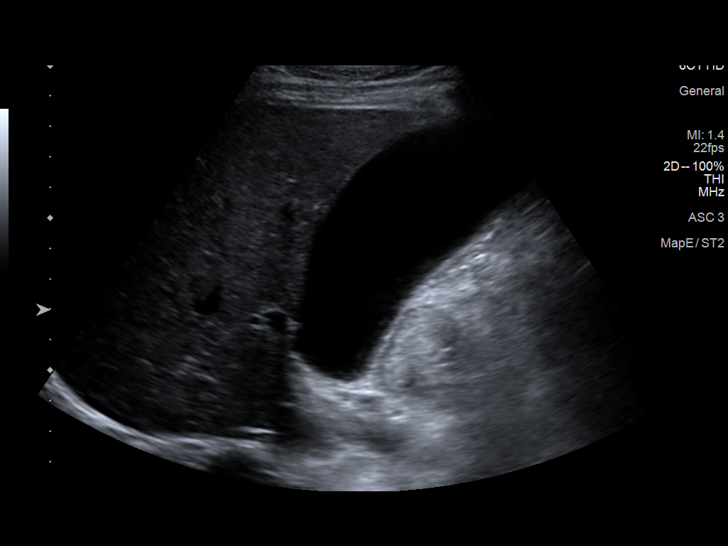
[im 14/81]
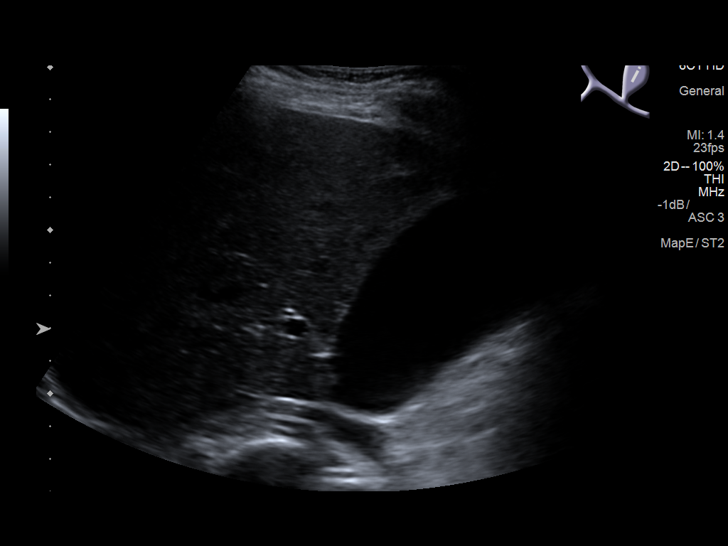
[im 21/81]
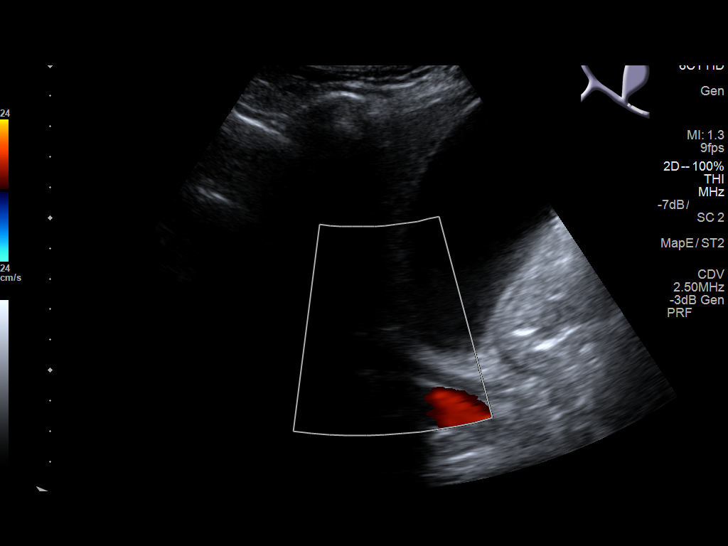
[im 27/81]
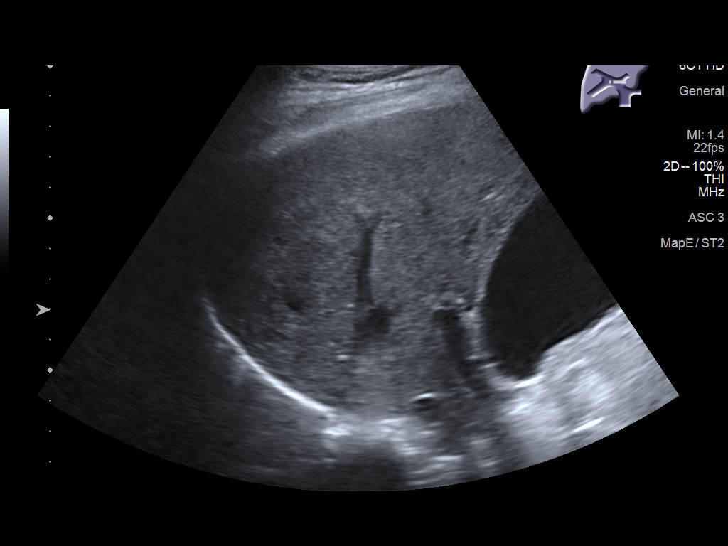
[im 31/81]
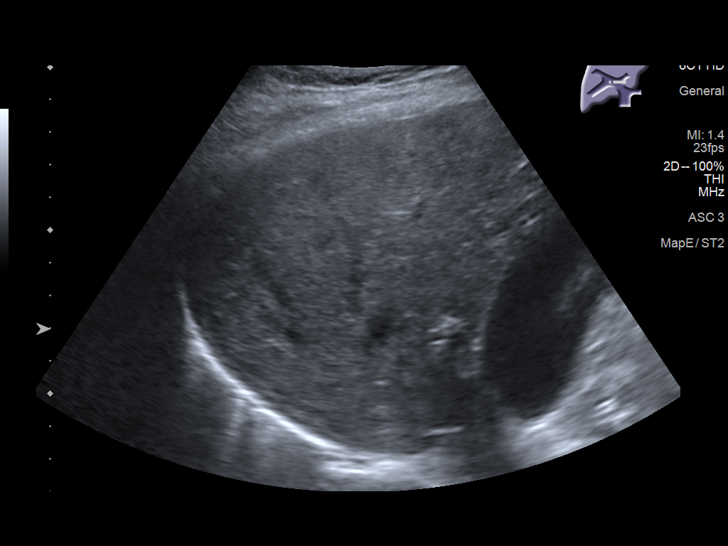
[im 37/81]
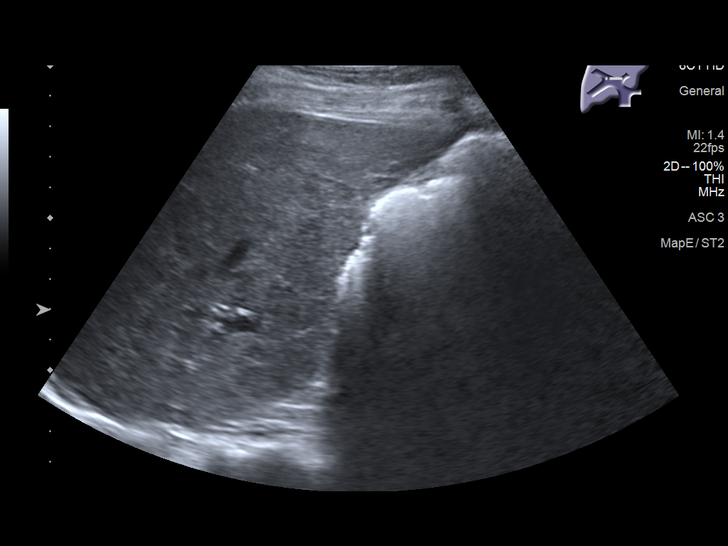
[im 44/81]
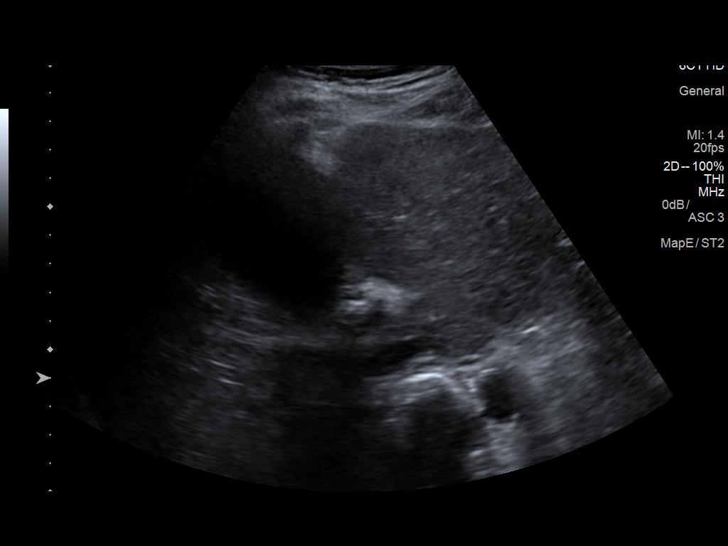
[im 51/81]
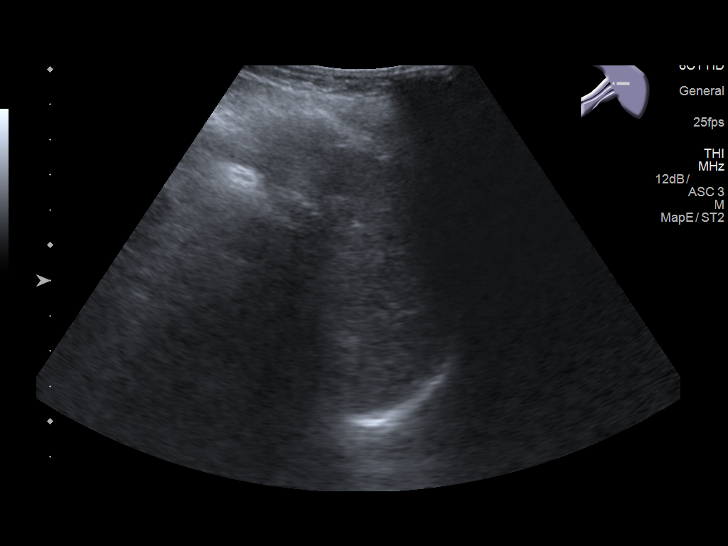
[im 54/81]
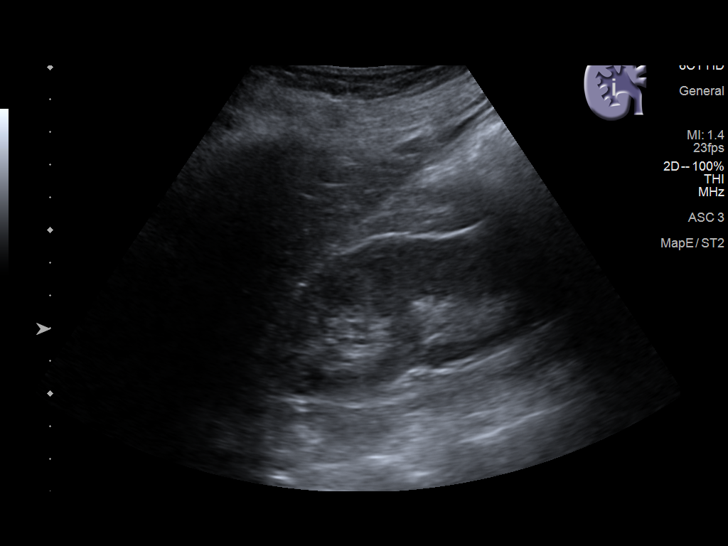
[im 61/81]
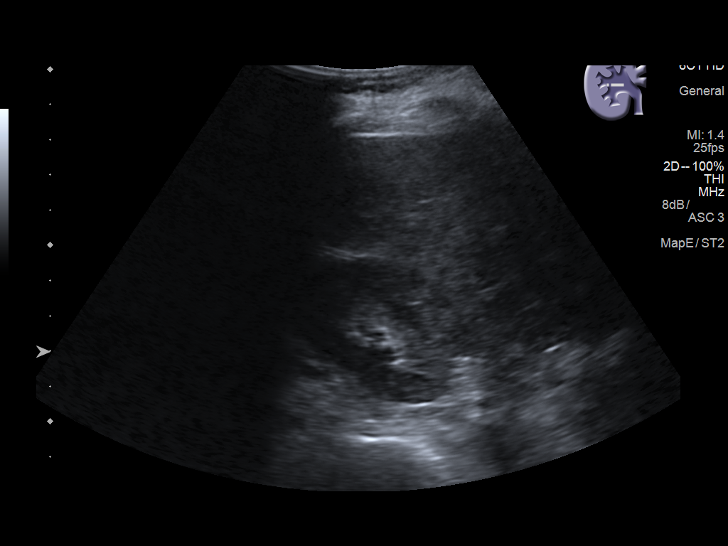
[im 67/81]
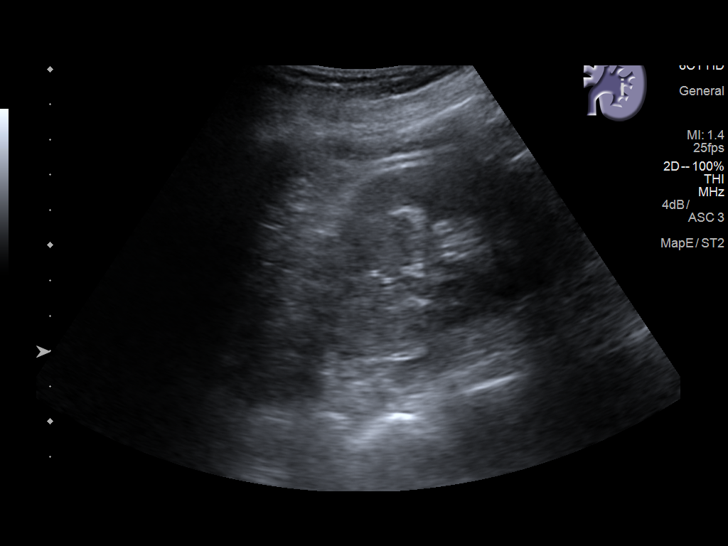
[im 74/81]
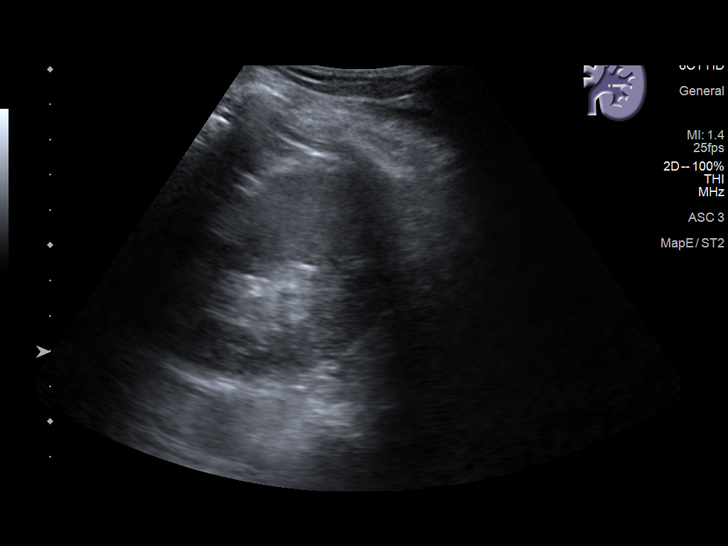
[im 81/81]
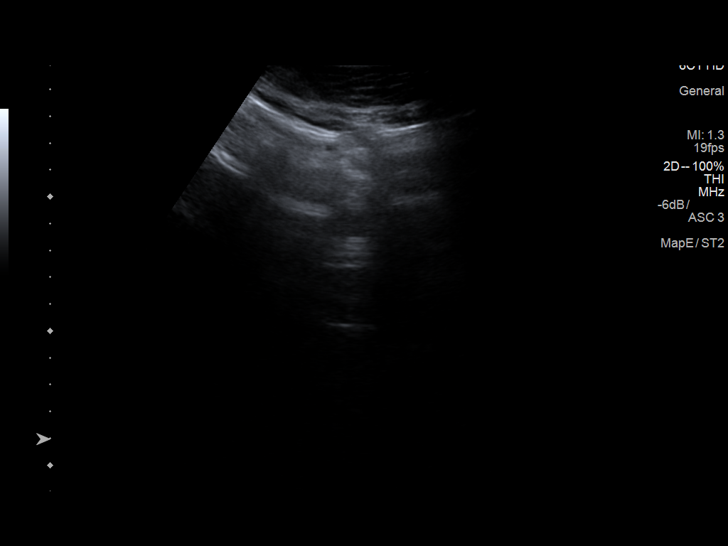

[14 of 25 positions shown; findings below may reference images not displayed]

FINDINGS: Gallbladder: Distended, at least 10.2 cm length and 4.8 cm
transverse. No definite gallstones, wall thickening, pericholecystic
fluid or sonographic Murphy sign.

Common bile duct: Diameter: 5 mm diameter, normal

Liver: Normal appearance

IVC: Normal appearance

Pancreas: Normal appearance

Spleen: Normal appearance, 5.1 cm length

Right Kidney: Length: 10.1 cm. Normal morphology without mass or
hydronephrosis.

Left Kidney: Length: 10.0 cm. Normal morphology without mass or
hydronephrosis.

Abdominal aorta: Visualized portion normal caliber, distal aorta and
bifurcation obscured by bowel gas

Other findings: No free fluid
IMPRESSION: Distended gallbladder without stones or wall thickening.

Otherwise negative exam.

## 2019-02-25 IMAGING — US US ABDOMEN LIMITED
1 series · 14 of 25 positions shown · non-contrast
Comparison: CT 06/13/2017

CLINICAL DATA: Abdominal pain

EXAM:
ULTRASOUND ABDOMEN LIMITED RIGHT UPPER QUADRANT

[Series 1: us abdomen limited · 0.19mm/px · 14 of 67 slices shown]
[im 1/67]
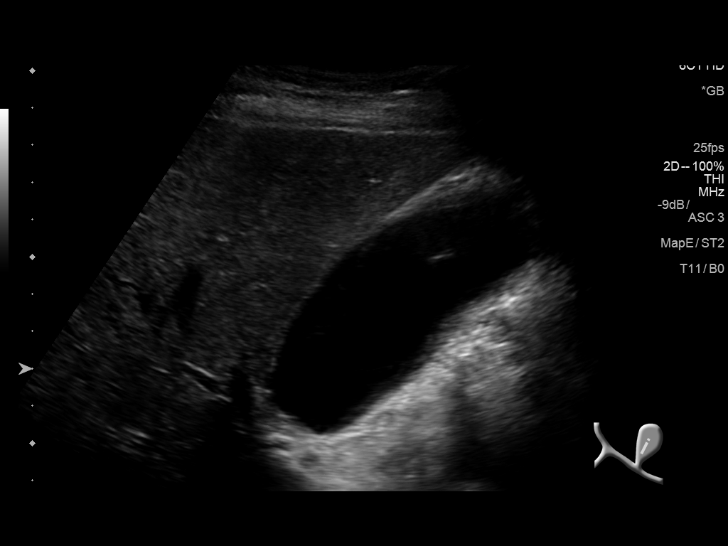
[im 6/67]
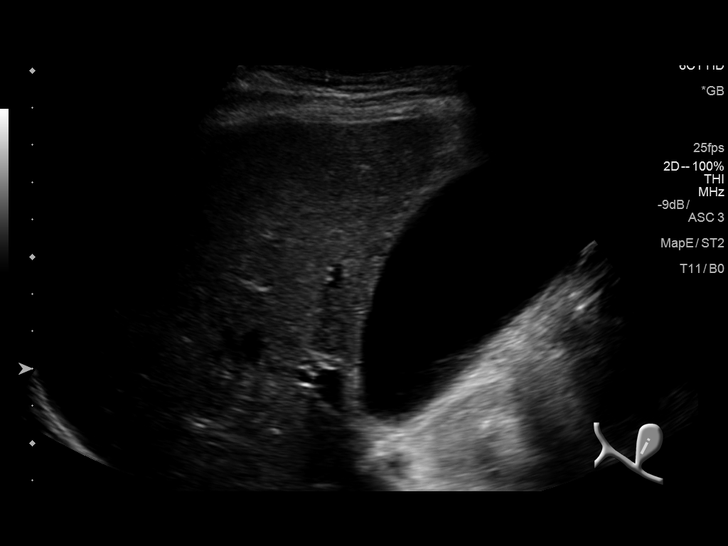
[im 12/67]
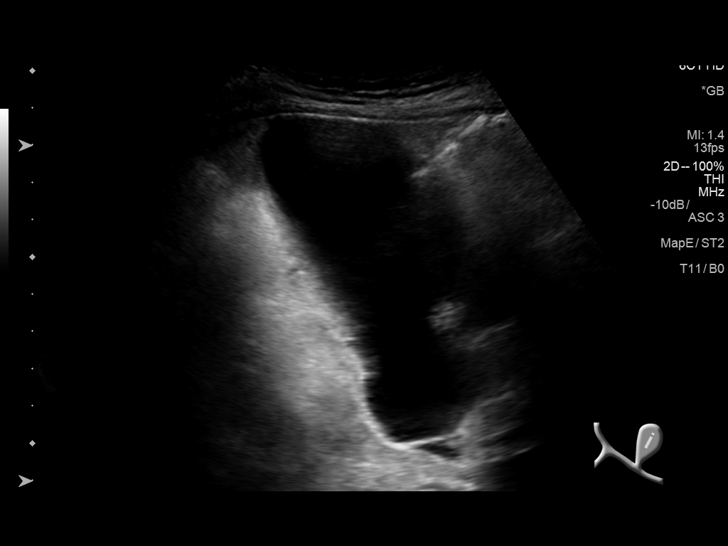
[im 17/67]
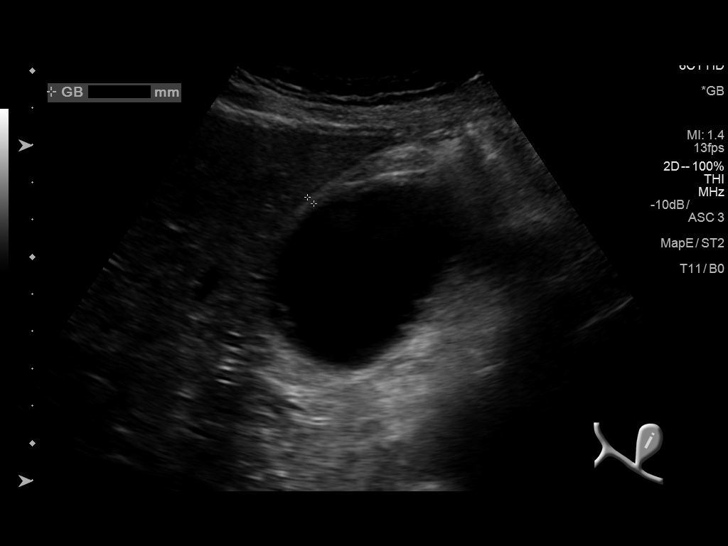
[im 23/67]
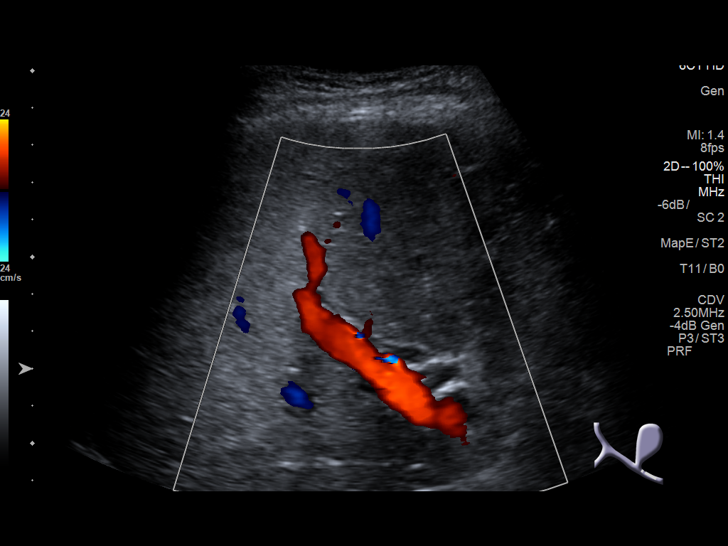
[im 25/67]
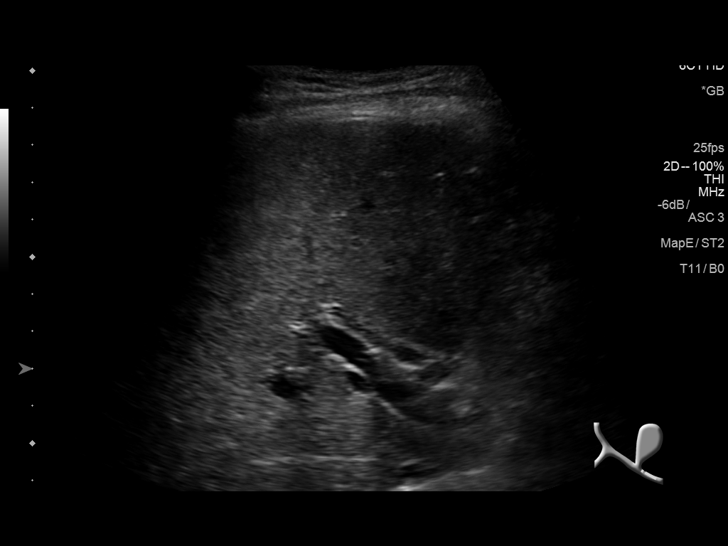
[im 31/67]
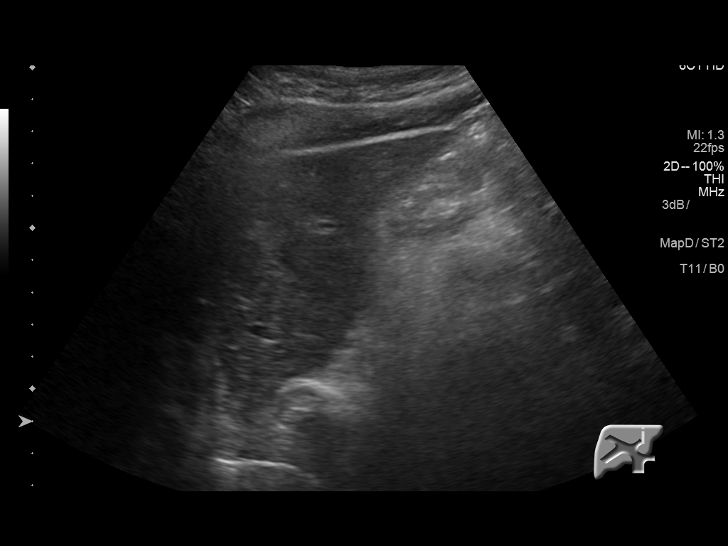
[im 36/67]
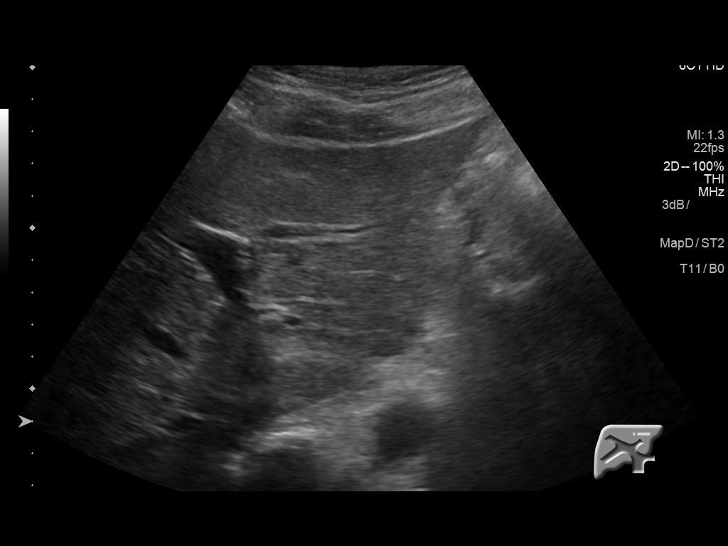
[im 42/67]
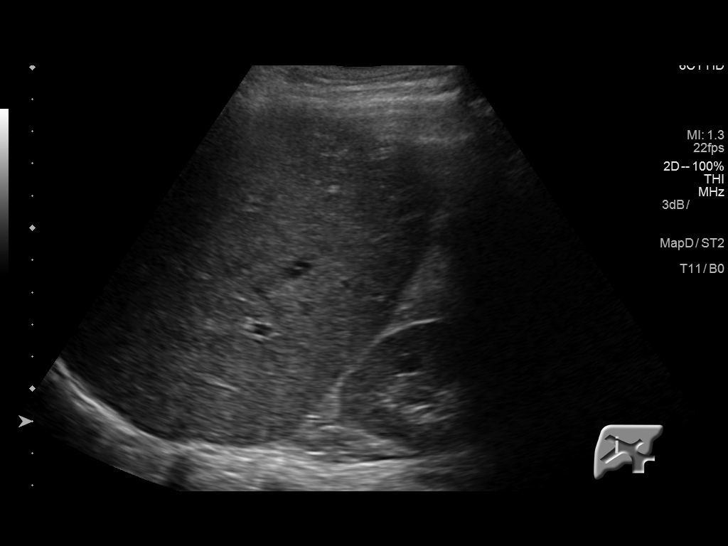
[im 45/67]
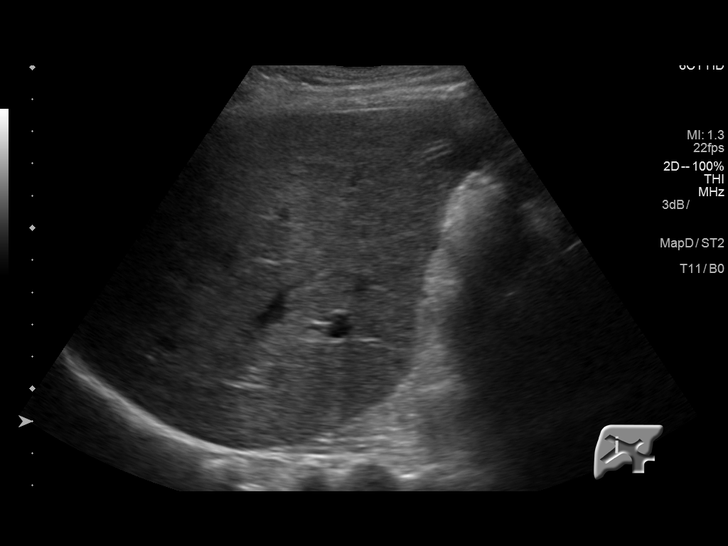
[im 50/67]
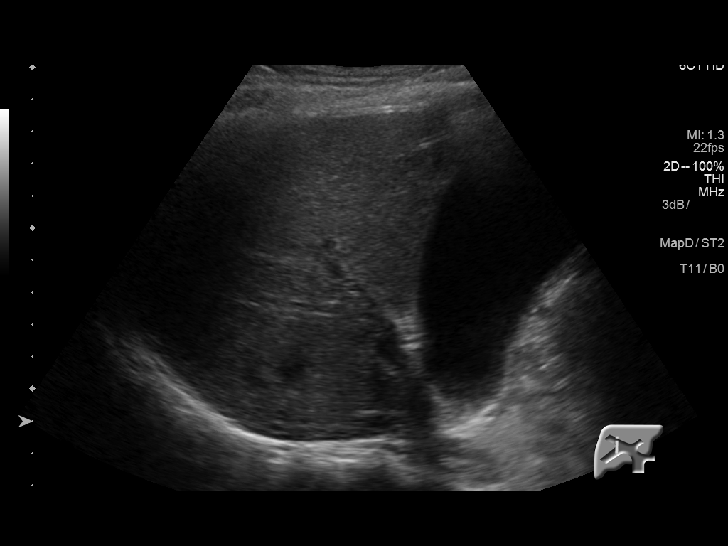
[im 56/67]
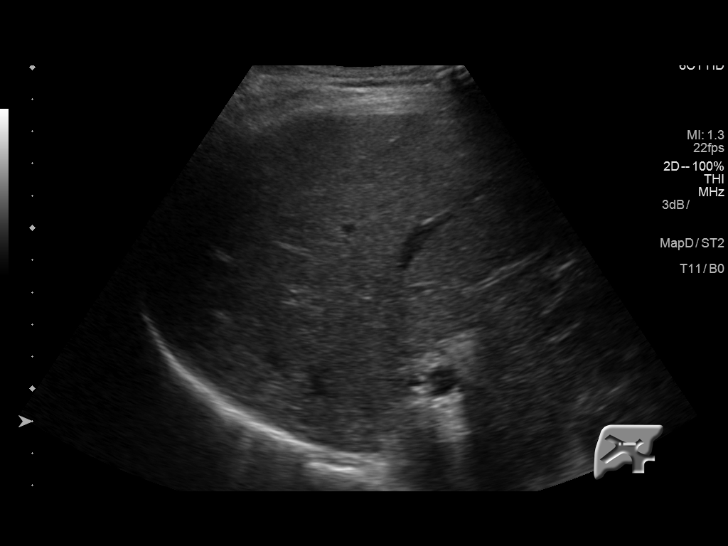
[im 61/67]
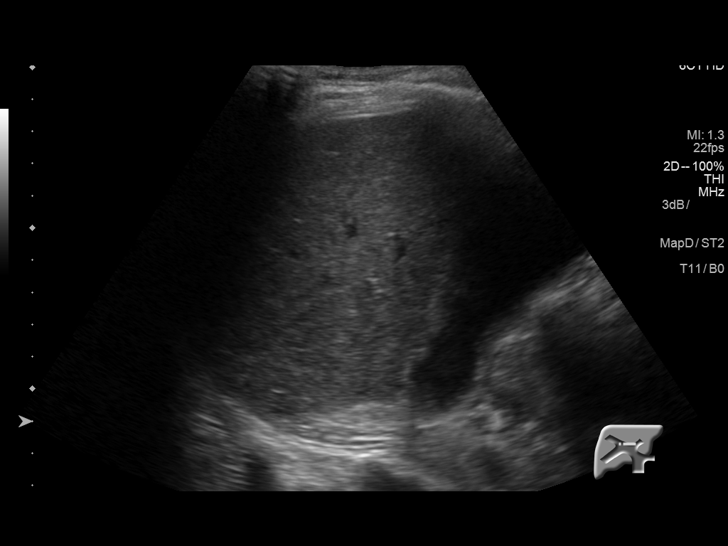
[im 67/67]
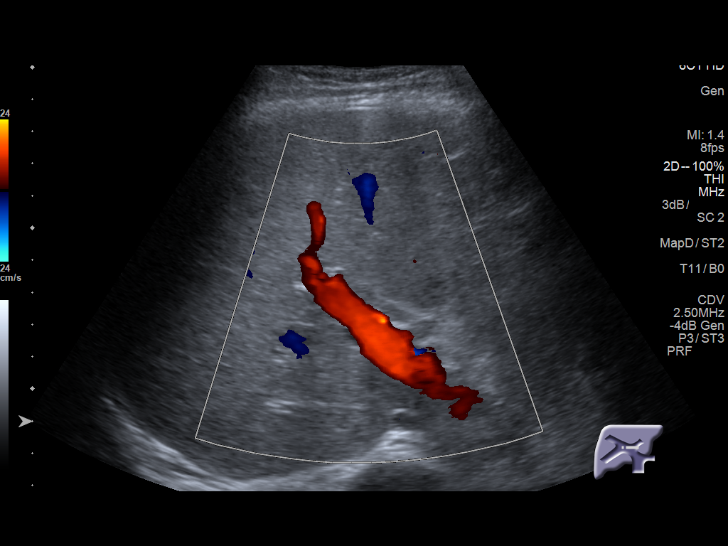

[14 of 25 positions shown; findings below may reference images not displayed]

FINDINGS: Gallbladder:

Mild distention of the gallbladder. No visible stones or wall
thickening. Negative sonographic Pratima.

Common bile duct:

Diameter: Normal caliber, 4 mm

Liver:

No focal lesion identified. Within normal limits in parenchymal
echogenicity.
IMPRESSION: Gallbladder is mildly distended, but no visible stones or wall
thickening.

No acute findings.

## 2019-04-13 IMAGING — US US ABDOMEN COMPLETE
1 series · 13 of 25 positions shown · non-contrast
Comparison: CT abdomen and pelvis July 31, 2017

CLINICAL DATA: Elevated liver enzymes and pancreatitis

EXAM:
ABDOMEN ULTRASOUND COMPLETE

[Series 1: us abdomen complete · 0.18mm/px · 13 of 137 slices shown]
[im 1/137]
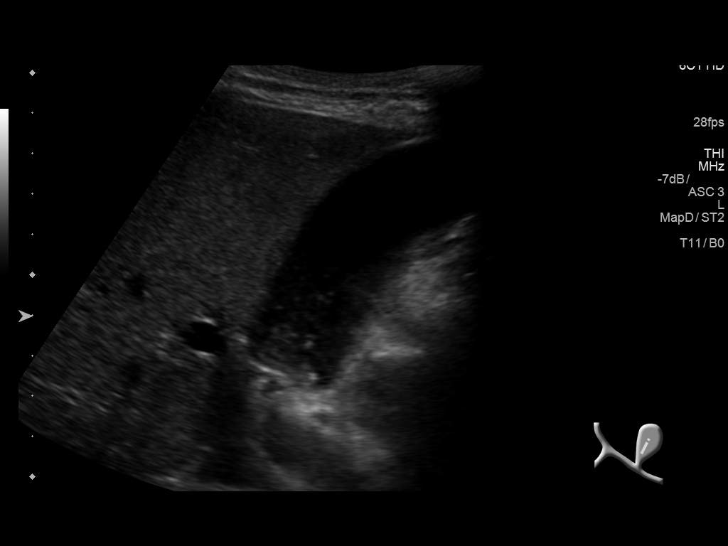
[im 12/137]
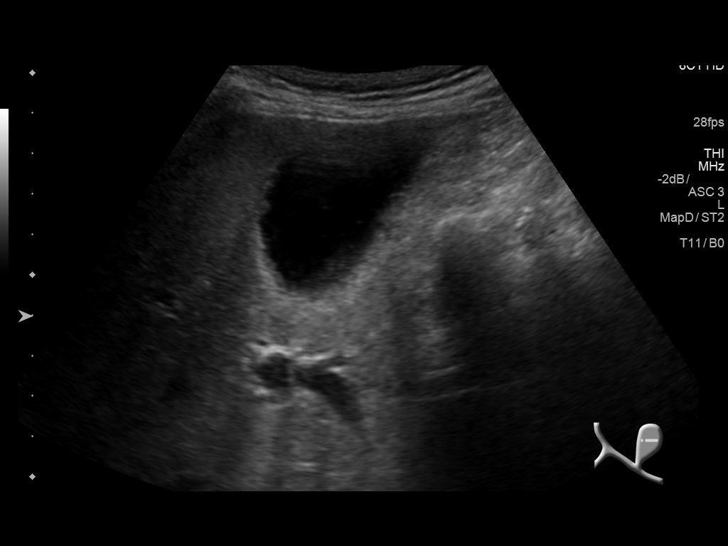
[im 23/137]
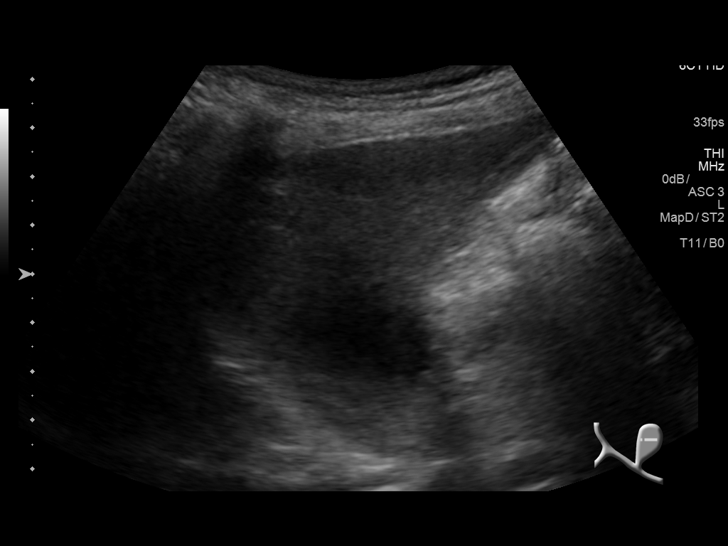
[im 35/137]
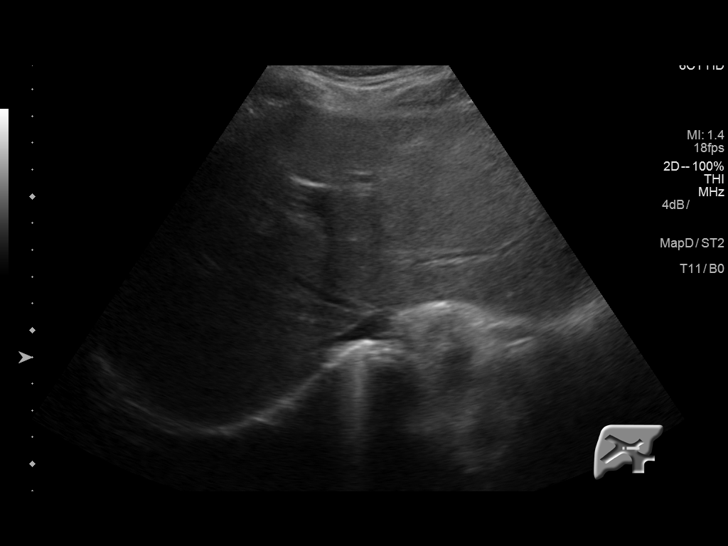
[im 46/137]
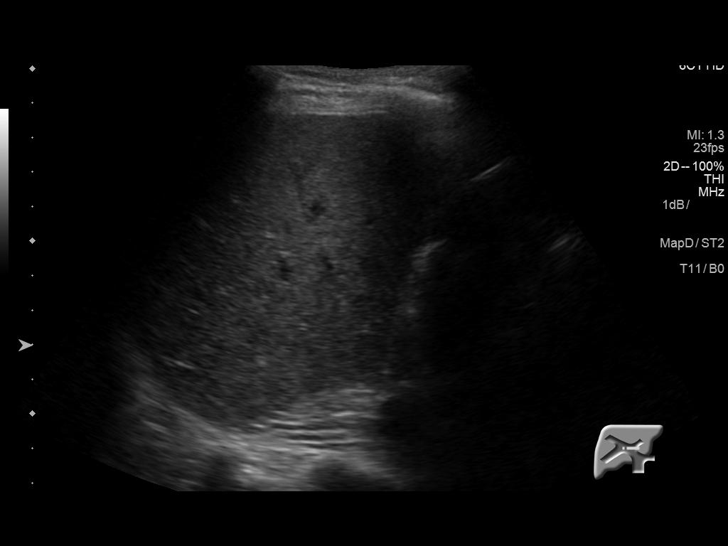
[im 57/137]
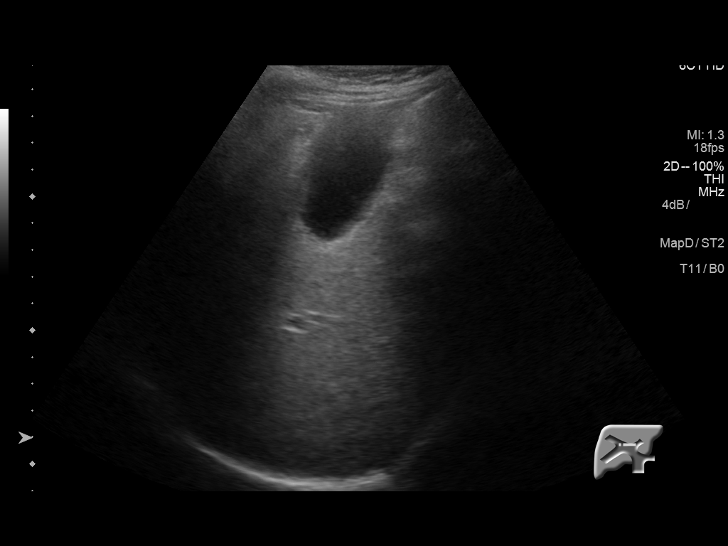
[im 69/137]
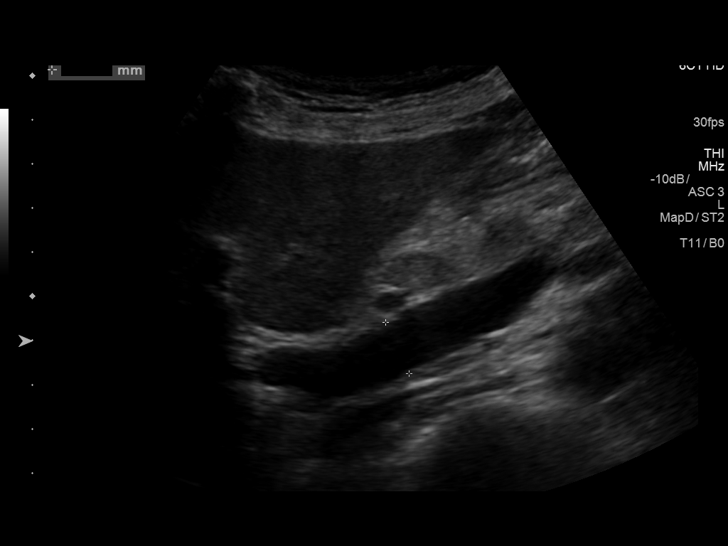
[im 80/137]
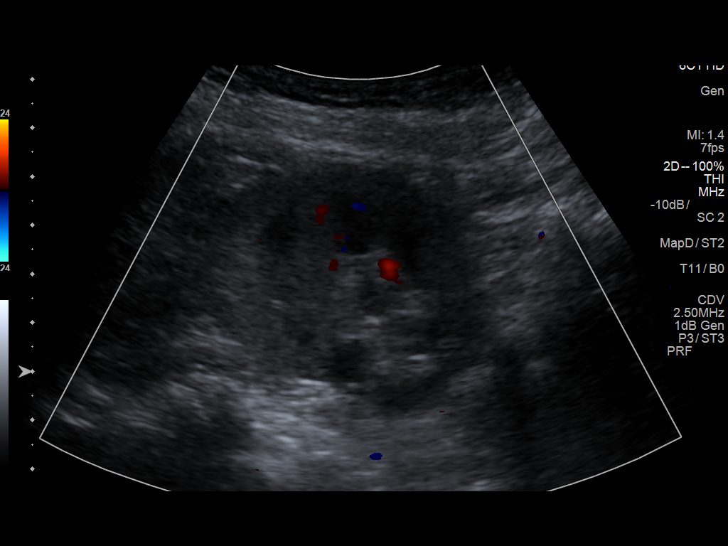
[im 91/137]
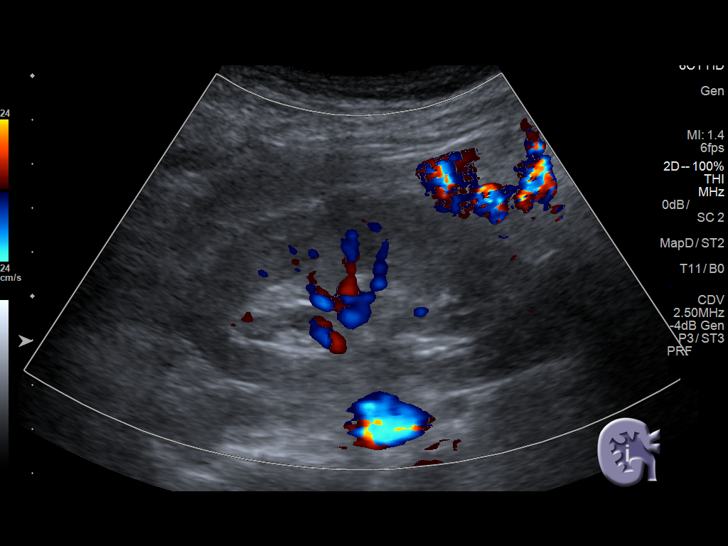
[im 103/137]
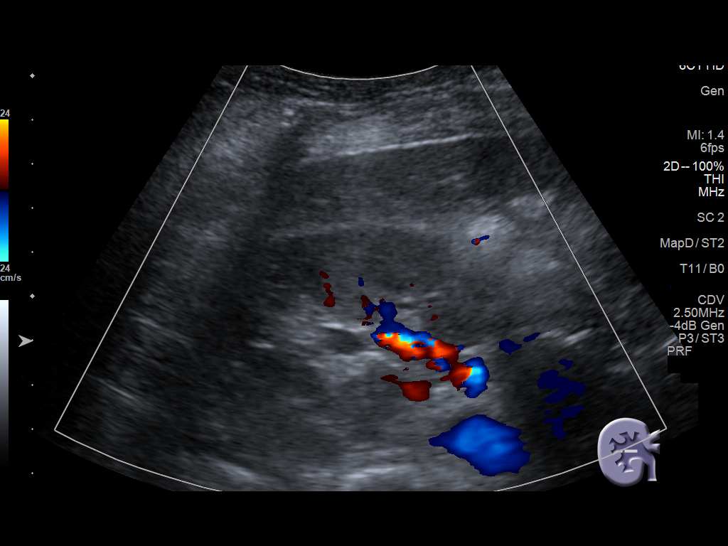
[im 114/137]
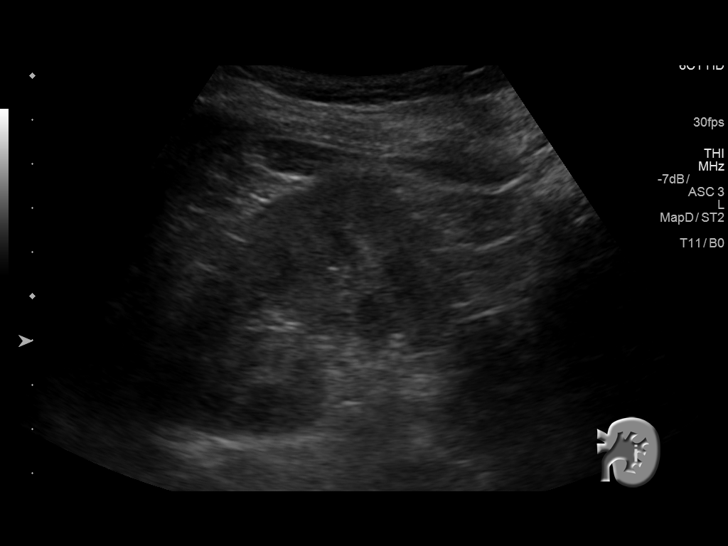
[im 125/137]
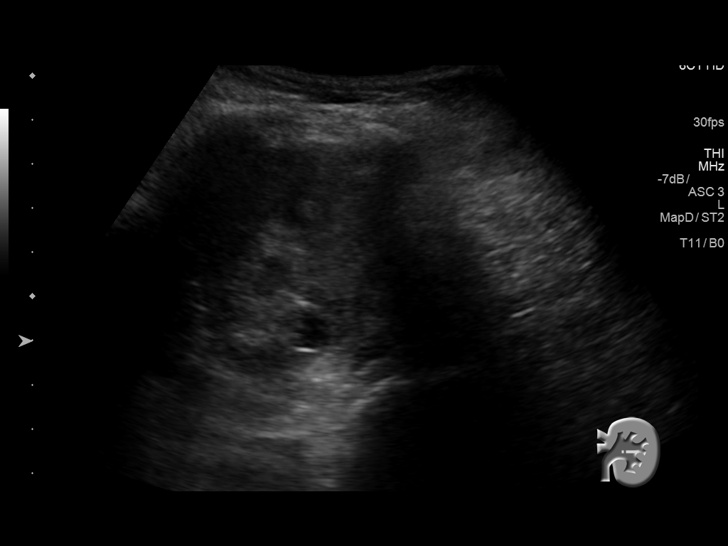
[im 137/137]
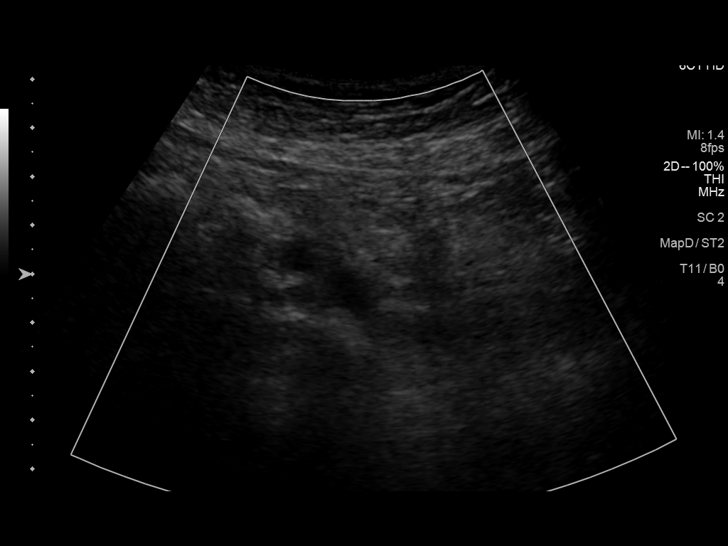

[13 of 25 positions shown; findings below may reference images not displayed]

FINDINGS: Gallbladder: Within the gallbladder, there are multiple tiny
echogenic foci which may represent cholesterol crystals. There is
moderate sludge in the gallbladder. No well-defined gallstones are
seen. There is no appreciable gallbladder wall thickening or
pericholecystic fluid. No sonographic Murphy sign noted by
sonographer.

Common bile duct: Diameter: 2 mm. There is no intrahepatic, common
hepatic, or common bile duct dilatation.

Liver: No focal lesion identified. Within normal limits in
parenchymal echogenicity. Portal vein is patent on color Doppler
imaging with normal direction of blood flow towards the liver.

IVC: No abnormality visualized.

Pancreas: Pancreas overall appears prominent and edematous without
well-defined mass or fluid collection. There is no pancreatic duct
dilatation.

Spleen: Size and appearance within normal limits.

Right Kidney: Length: 9.3 cm. Echogenicity within normal limits. No
mass visualized. There is mild pelvicaliectasis.

Left Kidney: Length: 9.3 cm. Echogenicity within normal limits.
There is a cyst in the lower pole left kidney medially measuring
x 0.8 x 0.9 cm. There is mild pelvicaliectasis.

Abdominal aorta: No aneurysm visualized.

Other findings: No demonstrable ascites.
IMPRESSION: 1. Pancreas appears prominent and edematous without mass or
inflammatory focus. The appearance of the pancreas does indicate a
degree of acute pancreatitis.

2. Sludge and cholesterol crystals within the gallbladder. No
gallstones evident. No gallbladder wall thickening or
pericholecystic fluid.

3. Slight fullness of each renal collecting system without focal
area of obstruction identified on either side.

4.  Small left renal cyst.
